# Patient Record
Sex: Female | Born: 1945 | Race: White | Hispanic: No | Marital: Married | Smoking: Never smoker
Health system: Southern US, Community
[De-identification: ages and names within clinical notes are randomized; demographics above are authoritative.]

## PROBLEM LIST (undated history)

## (undated) DIAGNOSIS — M1990 Unspecified osteoarthritis, unspecified site: Secondary | ICD-10-CM

## (undated) DIAGNOSIS — K5900 Constipation, unspecified: Secondary | ICD-10-CM

## (undated) DIAGNOSIS — H269 Unspecified cataract: Secondary | ICD-10-CM

## (undated) HISTORY — DX: Constipation, unspecified: K59.00

## (undated) HISTORY — PX: KNEE ARTHROPLASTY: SHX992

## (undated) HISTORY — PX: POLYPECTOMY: SHX149

## (undated) HISTORY — DX: Unspecified osteoarthritis, unspecified site: M19.90

## (undated) HISTORY — PX: COLONOSCOPY: SHX174

## (undated) HISTORY — PX: KNEE SURGERY: SHX244

---

## 1997-06-08 ENCOUNTER — Ambulatory Visit: Admission: RE | Admit: 1997-06-08 | Discharge: 1997-06-08 | Payer: Self-pay | Admitting: Internal Medicine

## 1997-07-10 ENCOUNTER — Ambulatory Visit (HOSPITAL_COMMUNITY): Admission: RE | Admit: 1997-07-10 | Discharge: 1997-07-10 | Payer: Self-pay | Admitting: Gastroenterology

## 1998-04-29 ENCOUNTER — Other Ambulatory Visit: Admission: RE | Admit: 1998-04-29 | Discharge: 1998-04-29 | Payer: Self-pay | Admitting: Obstetrics & Gynecology

## 1998-04-30 ENCOUNTER — Other Ambulatory Visit: Admission: RE | Admit: 1998-04-30 | Discharge: 1998-04-30 | Payer: Self-pay | Admitting: Obstetrics & Gynecology

## 2003-11-13 ENCOUNTER — Encounter: Admission: RE | Admit: 2003-11-13 | Discharge: 2003-11-13 | Payer: Self-pay | Admitting: Orthopedic Surgery

## 2004-02-02 ENCOUNTER — Other Ambulatory Visit: Admission: RE | Admit: 2004-02-02 | Discharge: 2004-02-02 | Payer: Self-pay | Admitting: Internal Medicine

## 2004-02-16 ENCOUNTER — Ambulatory Visit: Payer: Self-pay | Admitting: Internal Medicine

## 2004-04-18 ENCOUNTER — Ambulatory Visit: Payer: Self-pay | Admitting: Internal Medicine

## 2005-08-11 ENCOUNTER — Ambulatory Visit: Payer: Self-pay | Admitting: Family Medicine

## 2006-02-16 HISTORY — PX: HERNIA REPAIR: SHX51

## 2006-02-20 ENCOUNTER — Ambulatory Visit: Payer: Self-pay | Admitting: Family Medicine

## 2006-02-22 ENCOUNTER — Ambulatory Visit: Payer: Self-pay | Admitting: Family Medicine

## 2006-02-22 LAB — CONVERTED CEMR LAB
BUN: 15 mg/dL (ref 6–23)
CO2: 29 meq/L (ref 19–32)
Calcium: 9.4 mg/dL (ref 8.4–10.5)
Chloride: 106 meq/L (ref 96–112)
Cholesterol: 167 mg/dL (ref 0–200)
Creatinine, Ser: 0.9 mg/dL (ref 0.4–1.2)
GFR calc Af Amer: 82 mL/min
GFR calc non Af Amer: 68 mL/min
Glucose, Bld: 96 mg/dL (ref 70–99)
HDL: 39.8 mg/dL (ref >39.0)
LDL Cholesterol: 113 mg/dL — ABNORMAL HIGH (ref 0–99)
Potassium: 4.9 meq/L (ref 3.5–5.1)
Sodium: 139 meq/L (ref 135–145)
Total CHOL/HDL Ratio: 4.2
Triglycerides: 69 mg/dL (ref 0–149)
VLDL: 14 mg/dL (ref 0–40)

## 2006-03-21 ENCOUNTER — Encounter (INDEPENDENT_AMBULATORY_CARE_PROVIDER_SITE_OTHER): Payer: Self-pay | Admitting: Specialist

## 2006-03-21 ENCOUNTER — Ambulatory Visit (HOSPITAL_COMMUNITY): Admission: RE | Admit: 2006-03-21 | Discharge: 2006-03-21 | Payer: Self-pay | Admitting: General Surgery

## 2006-05-31 DIAGNOSIS — F329 Major depressive disorder, single episode, unspecified: Secondary | ICD-10-CM | POA: Insufficient documentation

## 2006-05-31 DIAGNOSIS — M899 Disorder of bone, unspecified: Secondary | ICD-10-CM | POA: Insufficient documentation

## 2006-05-31 DIAGNOSIS — G4700 Insomnia, unspecified: Secondary | ICD-10-CM | POA: Insufficient documentation

## 2006-05-31 DIAGNOSIS — M949 Disorder of cartilage, unspecified: Secondary | ICD-10-CM

## 2006-07-18 ENCOUNTER — Ambulatory Visit: Payer: Self-pay | Admitting: Internal Medicine

## 2006-08-02 ENCOUNTER — Ambulatory Visit: Payer: Self-pay | Admitting: Internal Medicine

## 2006-08-02 ENCOUNTER — Encounter (INDEPENDENT_AMBULATORY_CARE_PROVIDER_SITE_OTHER): Payer: Self-pay | Admitting: Family Medicine

## 2006-08-02 ENCOUNTER — Encounter: Payer: Self-pay | Admitting: Internal Medicine

## 2006-11-15 ENCOUNTER — Encounter (INDEPENDENT_AMBULATORY_CARE_PROVIDER_SITE_OTHER): Payer: Self-pay | Admitting: Family Medicine

## 2007-02-07 ENCOUNTER — Telehealth (INDEPENDENT_AMBULATORY_CARE_PROVIDER_SITE_OTHER): Payer: Self-pay | Admitting: *Deleted

## 2007-05-15 ENCOUNTER — Encounter (INDEPENDENT_AMBULATORY_CARE_PROVIDER_SITE_OTHER): Payer: Self-pay | Admitting: Family Medicine

## 2007-05-17 ENCOUNTER — Telehealth (INDEPENDENT_AMBULATORY_CARE_PROVIDER_SITE_OTHER): Payer: Self-pay | Admitting: *Deleted

## 2007-07-22 ENCOUNTER — Encounter: Payer: Self-pay | Admitting: Internal Medicine

## 2007-07-22 ENCOUNTER — Ambulatory Visit: Payer: Self-pay | Admitting: Family Medicine

## 2007-07-22 ENCOUNTER — Encounter (INDEPENDENT_AMBULATORY_CARE_PROVIDER_SITE_OTHER): Payer: Self-pay | Admitting: *Deleted

## 2007-07-29 ENCOUNTER — Ambulatory Visit: Payer: Self-pay | Admitting: Pulmonary Disease

## 2007-07-29 ENCOUNTER — Telehealth (INDEPENDENT_AMBULATORY_CARE_PROVIDER_SITE_OTHER): Payer: Self-pay | Admitting: *Deleted

## 2007-07-30 ENCOUNTER — Telehealth (INDEPENDENT_AMBULATORY_CARE_PROVIDER_SITE_OTHER): Payer: Self-pay | Admitting: *Deleted

## 2007-07-31 ENCOUNTER — Ambulatory Visit: Payer: Self-pay | Admitting: Family Medicine

## 2007-08-01 ENCOUNTER — Encounter (INDEPENDENT_AMBULATORY_CARE_PROVIDER_SITE_OTHER): Payer: Self-pay | Admitting: *Deleted

## 2007-08-01 LAB — CONVERTED CEMR LAB: Vit D, 1,25-Dihydroxy: 46 (ref 30–89)

## 2007-08-11 LAB — CONVERTED CEMR LAB
ALT: 22 units/L (ref 0–35)
AST: 32 units/L (ref 0–37)
Albumin: 4.2 g/dL (ref 3.5–5.2)
Alkaline Phosphatase: 74 units/L (ref 39–117)
BUN: 16 mg/dL (ref 6–23)
Bilirubin, Direct: 0.1 mg/dL (ref 0.0–0.3)
CO2: 32 meq/L (ref 19–32)
Calcium: 10 mg/dL (ref 8.4–10.5)
Chloride: 104 meq/L (ref 96–112)
Cholesterol: 195 mg/dL (ref 0–200)
Creatinine, Ser: 0.8 mg/dL (ref 0.4–1.2)
GFR calc Af Amer: 93 mL/min
GFR calc non Af Amer: 77 mL/min
Glucose, Bld: 100 mg/dL — ABNORMAL HIGH (ref 70–99)
HDL: 40.8 mg/dL (ref >39.0)
LDL Cholesterol: 138 mg/dL — ABNORMAL HIGH (ref 0–99)
Potassium: 5.7 meq/L — ABNORMAL HIGH (ref 3.5–5.1)
Sodium: 141 meq/L (ref 135–145)
TSH: 1.94 microintl units/mL (ref 0.35–5.50)
Total Bilirubin: 0.8 mg/dL (ref 0.3–1.2)
Total CHOL/HDL Ratio: 4.8
Total Protein: 7.9 g/dL (ref 6.0–8.3)
Triglycerides: 82 mg/dL (ref 0–149)
VLDL: 16 mg/dL (ref 0–40)

## 2007-08-12 ENCOUNTER — Encounter (INDEPENDENT_AMBULATORY_CARE_PROVIDER_SITE_OTHER): Payer: Self-pay | Admitting: *Deleted

## 2007-12-17 ENCOUNTER — Telehealth (INDEPENDENT_AMBULATORY_CARE_PROVIDER_SITE_OTHER): Payer: Self-pay | Admitting: *Deleted

## 2008-01-22 ENCOUNTER — Encounter: Admission: RE | Admit: 2008-01-22 | Discharge: 2008-01-22 | Payer: Self-pay | Admitting: Orthopedic Surgery

## 2008-12-15 ENCOUNTER — Encounter: Payer: Self-pay | Admitting: Family Medicine

## 2009-05-18 ENCOUNTER — Ambulatory Visit: Payer: Self-pay | Admitting: Family Medicine

## 2009-05-18 ENCOUNTER — Encounter (INDEPENDENT_AMBULATORY_CARE_PROVIDER_SITE_OTHER): Payer: Self-pay | Admitting: *Deleted

## 2009-05-18 DIAGNOSIS — M810 Age-related osteoporosis without current pathological fracture: Secondary | ICD-10-CM | POA: Insufficient documentation

## 2009-05-26 ENCOUNTER — Ambulatory Visit: Payer: Self-pay | Admitting: Family Medicine

## 2009-05-26 LAB — CONVERTED CEMR LAB
Bilirubin Urine: NEGATIVE
Blood in Urine, dipstick: NEGATIVE
Glucose, Urine, Semiquant: NEGATIVE
Ketones, urine, test strip: NEGATIVE
Nitrite: NEGATIVE
Protein, U semiquant: NEGATIVE
Specific Gravity, Urine: <1.005
Urobilinogen, UA: 0.2
WBC Urine, dipstick: NEGATIVE
pH: 7.5

## 2009-05-27 LAB — CONVERTED CEMR LAB: Vit D, 25-Hydroxy: 45 ng/mL (ref 30–89)

## 2009-05-31 LAB — CONVERTED CEMR LAB
ALT: 19 units/L (ref 0–35)
AST: 28 units/L (ref 0–37)
Albumin: 4.1 g/dL (ref 3.5–5.2)
Alkaline Phosphatase: 58 units/L (ref 39–117)
BUN: 16 mg/dL (ref 6–23)
Basophils Absolute: 0.1 10*3/uL (ref 0.0–0.1)
Basophils Relative: 1.2 % (ref 0.0–3.0)
Bilirubin, Direct: 0.1 mg/dL (ref 0.0–0.3)
CO2: 31 meq/L (ref 19–32)
Calcium: 9.8 mg/dL (ref 8.4–10.5)
Chloride: 100 meq/L (ref 96–112)
Cholesterol: 172 mg/dL (ref 0–200)
Creatinine, Ser: 0.8 mg/dL (ref 0.4–1.2)
Eosinophils Absolute: 0.8 10*3/uL — ABNORMAL HIGH (ref 0.0–0.7)
Eosinophils Relative: 13.3 % — ABNORMAL HIGH (ref 0.0–5.0)
GFR calc non Af Amer: 74.62 mL/min (ref >60)
Glucose, Bld: 81 mg/dL (ref 70–99)
HCT: 37.6 % (ref 36.0–46.0)
HDL: 43 mg/dL (ref >39.00)
Hemoglobin: 13 g/dL (ref 12.0–15.0)
LDL Cholesterol: 103 mg/dL — ABNORMAL HIGH (ref 0–99)
Lymphocytes Relative: 35.7 % (ref 12.0–46.0)
Lymphs Abs: 2.2 10*3/uL (ref 0.7–4.0)
MCHC: 34.7 g/dL (ref 30.0–36.0)
MCV: 91.3 fL (ref 78.0–100.0)
Monocytes Absolute: 0.5 10*3/uL (ref 0.1–1.0)
Monocytes Relative: 7.9 % (ref 3.0–12.0)
Neutro Abs: 2.6 10*3/uL (ref 1.4–7.7)
Neutrophils Relative %: 41.9 % — ABNORMAL LOW (ref 43.0–77.0)
Platelets: 221 10*3/uL (ref 150.0–400.0)
Potassium: 4.4 meq/L (ref 3.5–5.1)
RBC: 4.12 M/uL (ref 3.87–5.11)
RDW: 13 % (ref 11.5–14.6)
Sodium: 140 meq/L (ref 135–145)
TSH: 2.18 microintl units/mL (ref 0.35–5.50)
Total Bilirubin: 0.5 mg/dL (ref 0.3–1.2)
Total CHOL/HDL Ratio: 4
Total Protein: 7.2 g/dL (ref 6.0–8.3)
Triglycerides: 129 mg/dL (ref 0.0–149.0)
VLDL: 25.8 mg/dL (ref 0.0–40.0)
WBC: 6.3 10*3/uL (ref 4.5–10.5)

## 2009-06-01 ENCOUNTER — Ambulatory Visit: Payer: Self-pay | Admitting: Family Medicine

## 2009-06-02 LAB — CONVERTED CEMR LAB: Fecal Occult Bld: NEGATIVE

## 2010-02-13 LAB — CONVERTED CEMR LAB: Pap Smear: NORMAL

## 2010-02-15 NOTE — Progress Notes (Signed)
Summary: low[facility]--Refill  Phone Note Refill Request   Refills Requested: Medication #1:  BONIVA 150 MG TABS Take 1 tablet by mouth as directed Rx received via fax from [facility]--[phone]  Initial call taken by: Freddy Jaksch,  May 17, 2007 9:17 AM  Follow-up for Phone Call        faxed to [facility] has appt pending 07/22/07 .........................Marland KitchenDoristine Devoid  May 17, 2007 10:25 AM       Prescriptions: BONIVA 150 MG TABS (IBANDRONATE SODIUM) Take 1 tablet by mouth as directed  #3 x 0   Entered by:   Doristine Devoid   Authorized by:   Loreen Freud DO   Signed by:   Doristine Devoid on 05/17/2007   Method used:   Historical   RxID:   [id number]

## 2010-02-15 NOTE — Progress Notes (Signed)
Summary: refill  Phone Note Refill Request   Refills Requested: Medication #1:  BONIVA 150 MG TABS Take 1 tablet by mouth as directed.  Medication #2:  clonidine hcl tabs .1mg  via [facility]  Initial call taken by: Charolette Child,  July 29, 2007 9:50 AM      Prescriptions: BONIVA 150 MG TABS (IBANDRONATE SODIUM) Take 1 tablet by mouth as directed  #3 x 0   Entered by:   Kandice Hams   Authorized by:   Loreen Freud DO   Signed by:   Kandice Hams on 07/29/2007   Method used:   Printed then faxed to ...       [facility]             , [place]         Ph:        Fax: [phone]   RxID:   [id number]

## 2010-02-15 NOTE — Procedures (Signed)
Summary: Gastroenterology--COLON  Gastroenterology--COLON   Imported By: Freddy Jaksch 08/17/2006 12:27:52  _____________________________________________________________________  External Attachment:    Type:   Image     Comment:   colon

## 2010-02-15 NOTE — Progress Notes (Signed)
Summary: rx shingles - dr Blossom Hoops  Medications Added BONIVA 150 MG TABS (IBANDRONATE SODIUM) Take 1 tablet by mouth as directed BUPROPION HCL (SMOKING DETER) 150 MG TB12 (BUPROPION HCL (SMOKING DETER)) Take 1 tablet by mouth twice a day CLONIDINE HCL 0.1 MG TABS (CLONIDINE HCL) Take 1 tablet by mouth twice a day       Phone Note Call from Patient Call back at [facility] Phone [phone]   Caller: Patient Summary of Call: patient needs rx for shingles [facility] [phone] --- fax [phone]  they will get inj there Initial call taken by: Okey Regal [place],  February 07, 2007 1:12 PM  Follow-up for Phone Call        Okay to call in Rx: Make sure pharmacist reviewed indication, contra -indication with patient prior to giving it. Follow-up by: Leanne Chang MD,  February 07, 2007 3:15 PM  Additional Follow-up for Phone Call Additional follow up Details #1::        Rx called in to pharmacy and will review vaccine and patient aware ...................................................................Ardyth Man  February 07, 2007 3:55 PM  Additional Follow-up by: Ardyth Man,  February 07, 2007 3:55 PM    New/Updated Medications: BONIVA 150 MG TABS (IBANDRONATE SODIUM) Take 1 tablet by mouth as directed BUPROPION HCL (SMOKING DETER) 150 MG TB12 (BUPROPION HCL (SMOKING DETER)) Take 1 tablet by mouth twice a day CLONIDINE HCL 0.1 MG TABS (CLONIDINE HCL) Take 1 tablet by mouth twice a day

## 2010-02-15 NOTE — Letter (Signed)
Summary: [facility]: Immunoassay Fecal Occult Blood (iFOB) Order Form  [facility] [place]  [address] [place], [place] [address]   Phone: [phone]  Fax: [phone]      [facility]: Immunoassay Fecal Occult Blood (iFOB) Order Form   May 18, 2009 MRN: [id number]   Charlotte Brooks 11/25/1945   Physicican Name:_____Yvonne Laury Axon, DO____________________  Diagnosis Code:______v76.51____________________      Charlotte Brooks CMA

## 2010-02-15 NOTE — Progress Notes (Signed)
Summary: [facility]--REFILL  Phone Note Refill Request   Refills Requested: Medication #1:  BONIVA 150 MG TABS Take 1 tablet by mouth as directed [facility]--[phone]  Initial call taken by: Freddy Jaksch,  December 17, 2007 10:59 AM    New/Updated Medications: BONIVA 150 MG TABS (IBANDRONATE SODIUM) Take 1 tablet by mouth monthly   Prescriptions: BONIVA 150 MG TABS (IBANDRONATE SODIUM) Take 1 tablet by mouth monthly  #33 x 0   Entered by:   Kandice Hams   Authorized by:   Loreen Freud DO   Signed by:   Kandice Hams on 12/17/2007   Method used:   Faxed to ...       [facility] (mail-order)             ,          Ph: [phone]       Fax: [phone]   RxID:   [id number]

## 2010-02-15 NOTE — Assessment & Plan Note (Signed)
Summary: consult for insomnia   Referred by:  Loreen Freud PCP:  Laury Axon  Chief Complaint:  Sleep Consult.  History of Present Illness: the patient is a 65 year old female who I've been asked to see for chronic insomnia. The patient has had difficulty with her sleep for greater than 20 years, and it continues to be a significant issue for her. The patient typically goes to bed between 10 and 11 at night, and he usually takes her at least one to 2 hours to get to sleep. She has tried a year ago relaxation prior to going to bed, and will sometimes read in bed or watch TV in order to relax. The patient may awaken 2-3 times a night, and when she does it is difficult for her to get back to sleep. She has tried taking Tylenol PM which helps a little bit, but continues to have significant issues. She will typically arises at 6 AM to start her day on a consistent basis. The patient admits that she has a sense of dread about going to sleep, and it is a major source of frustration for her. It has led to daytime fatigue, but only some sleep pressure during periods of inactivity. The patient has had no definite snoring, and had a sleep study a long time ago with insufficient sleep. She does have fidgety legs whenever she goes to bed on occasion, but her husband has never mentioned kicking during the night while sleeping. In terms of sleep hygiene, the patient never takes naps, and only drinks 2 cups of caffeinated beverage in the morning, with none being after 9 AM. She does not drink caffeinated tea or sodas. She never goes back in the bedroom during the day.she does not exercise within 4 hours of bedtime. The patient has tried Ambien, possibly Restoril, and now Tylenol PM.     Current Allergies: ! PCN ! SULFA ! CODEINE ! * IVP DYE  Past Medical History:    PREVENTIVE HEALTH CARE (ICD-V70.0)    INSOMNIA, CHRONIC (ICD-307.42)    OSTEOPENIA (ICD-733.90)    DEPRESSION (ICD-311)       Past Surgical History:     Reviewed history from 07/22/2007 and no changes required:       right sided hernia --2/08       knee surgery   Family History:    Family History Diabetes 1st degree relative    Family History of CAD Female 1st degree relative <50            heart disease: brother  Social History:    Married    Never Smoked    Alcohol use-yes    Drug use-no    Regular exercise-yes    pt does have children.    Risk Factors: Tobacco use:  never Drug use:  no HIV high-risk behavior:  no Caffeine use:  1 drinks per day Alcohol use:  yes    Type:  wine occass    Drinks per day:  <1 Exercise:  yes    Times per week:  7    Type:  [employer] classes and water and walking Seatbelt use:  100 % Sun Exposure:  rarely  Family History Risk Factors:    Family History of MI in females < 95 years old:  no    Family History of MI in males < 57 years old:  no  Colonoscopy History:    Date of Last Colonoscopy:  08/02/2006  Mammogram History:    Date of  Last Mammogram:  11/02/2006   Review of Systems      See HPI   Vital Signs:  Patient Profile:   65 Years Old Female Height:     65.5 inches Weight:      149.13 pounds O2 Sat:      98 % O2 treatment:    Room Air Temp:     98.0 degrees F oral Pulse rate:   72 / minute BP sitting:   120 / 70  (right arm) Cuff size:   regular  Vitals Entered By: Cyndia Diver LPN (July 29, 2007 11:57 AM)             Is Patient Diabetic? No Comments Medications reviewed with patient Cyndia Diver LPN  July 29, 2007 11:57 AM      Physical Exam  General:     well-developed female in no acute distress  Eyes:     PERRLA and EOMI.   Nose:     patent without discharge Mouth:     clear Neck:     no JVD, thyromegaly, or lymphadenopathy. Lungs:     totally clear to auscultation Heart:     regular rate and rhythm, no MRG Abdomen:     soft and nontender, bowel sounds present Extremities:     no significant edema, pulses intact distally Neurologic:      alert and oriented, moves all 4 extremities.      Impression & Recommendations:  Problem # 1:  PERSISTENT DISORDER INITIATING/MAINTAINING SLEEP (ICD-307.42) the patient describes classic psychophysiologic insomnia. She definitely has a sense of frustration about going to sleep. I also think she is a highly functional individual who has a difficult time settling down for sleep. I discussed with her various techniques for treatment of insomnia, and these include ritualistic behaviors as well as stimulus control therapy. The patient is willing to try these to see if they will help. She is already doing many of the main components of them. Her sleep hygiene is fairly good, but I have told her that she cannot read or watch television in bed. I would also like to give her a 2-3 week course of a sleep aid to help break the cycle of her insomnia. I think she may benefit the most from trazodone and rozeram in combination.  Medications Added to Medication List This Visit: 1)  Rozerem 8 Mg Tabs (Ramelteon) .... One tablet by mouth at bedtime 2)  Trazodone Hcl 50 Mg Tabs (Trazodone hcl) .... At bedtime   Patient Instructions: 1)  take rozerem one at bedtime 2)  take trazadone one before bedtime.  If oversedated the next day, can cut in half 3)  work on behavioral changes we discussed. 4)  f/u 3 weeks   Prescriptions: TRAZODONE HCL 50 MG  TABS (TRAZODONE HCL) At bedtime  #30 x 0   Entered and Authorized by:   Barbaraann Share MD   Signed by:   Barbaraann Share MD on 07/29/2007   Method used:   Print then Give to Patient   RxID:   [id number] ROZEREM 8 MG  TABS (RAMELTEON) One tablet by mouth at bedtime  #30 x 0   Entered and Authorized by:   Barbaraann Share MD   Signed by:   Barbaraann Share MD on 07/29/2007   Method used:   Print then Give to Patient   RxID:   [id number]  ]

## 2010-02-15 NOTE — Letter (Signed)
[facility]: [facility]   Imported By: Freddy Jaksch 05/15/2007 12:05:19  _____________________________________________________________________  External Attachment:    Type:   Image     Comment:   External Document

## 2010-02-15 NOTE — Letter (Signed)
Summary: Results Follow-up Letter  [place] [facility]  [address] [place], [place] [address]   Phone: [phone]  Fax: [phone]    08/12/2007        Charlotte Brooks [address] [place], [place]  [address]  Dear Ms. Mclarty,   The following are the results of your recent test(s):  Test     Result     Pap Smear    Normal_______  Not Normal_____       Comments: _________________________________________________________ Cholesterol LDL(Bad cholesterol):          Your goal is less than:         HDL (Good cholesterol):        Your goal is more than: _________________________________________________________ Other Tests:   _________________________________________________________  Please call for an appointment Or __Please see attached lab report._______________________________________________________ _________________________________________________________ _________________________________________________________  Sincerely,  Ardyth Man [place] [facility]

## 2010-02-15 NOTE — Progress Notes (Signed)
Summary: clonidine rx PT WANTS TO START BACK/DR [facility] ADVISE  Phone Note Refill Request Call back at [facility]   clonidine hcl tabs 0.1mg  qty 180  refill 3  Dr Laury Axon I did not see this med on pt med list, pt just had cpx 07/22/07.  I called pt  about this med; she said she was on this a while back given by Dr Blossom Hoops not for her bp but for her night sweats, she wanted to start back, she forgot to mention at cpx 07/22/07  See 02/07/07 ov note med was mentioned on it.  Please advise   Method Requested: clonidine hcl 0.1mg  Initial call taken by: Kandice Hams,  July 30, 2007 9:20 AM  Follow-up for Phone Call        clonidine 0.1 mg two times a day #60 2 refills Follow-up by: Loreen Freud DO,  July 30, 2007 12:01 PM  Additional Follow-up for Phone Call Additional follow up Details #1::        Spoke with pt informed rx faxed to [facility]  August 01, 2007 9:47 AM  Additional Follow-up by: Kandice Hams,  August 01, 2007 9:47 AM    New/Updated Medications: CLONIDINE HCL 0.1 MG  TABS (CLONIDINE HCL) 1 by mouth two times a day   Prescriptions: CLONIDINE HCL 0.1 MG  TABS (CLONIDINE HCL) 1 by mouth two times a day  #180 x 0   Entered by:   Kandice Hams   Authorized by:   Loreen Freud DO   Signed by:   Kandice Hams on 08/01/2007   Method used:   Printed then faxed to ...       [facility]             , [place]         Ph:        Fax: [phone]   RxID:   [id number]

## 2010-02-15 NOTE — Assessment & Plan Note (Signed)
Summary: cpx/kdc   Vital Signs:  Patient profile:   65 year old female Height:      65.5 inches Weight:      146 pounds BMI:     24.01 Pulse rate:   76 / minute Pulse rhythm:   regular BP sitting:   126 / 80  (left arm) Cuff size:   regular  Vitals Entered By: Army Fossa CMA (May 18, 2009 12:58 PM) CC: CPX, no pap.   History of Present Illness: Pt here for cpe and we will check labs another day.   No complaints.    Preventive Screening-Counseling & Management  Alcohol-Tobacco     Alcohol drinks/day: <1     Alcohol type: wine occass     Smoking Status: never  Caffeine-Diet-Exercise     Caffeine use/day: 1     Does Patient Exercise: yes     Type of exercise: [employer] classes and water and walking     Exercise (avg: min/session): 30-60     Times/week: 7  Hep-HIV-STD-Contraception     HIV Risk: no     Dental Visit-last 6 months yes     Dental Care Counseling: not indicated; dental care within six months     SBE monthly: yes     SBE Education/Counseling: not indicated; SBE done regularly     Sun Exposure-Excessive: rarely  Safety-Violence-Falls     Seat Belt Use: 100      Sexual History:  currently monogamous.        Drug Use:  never.    Current Medications (verified): 1)  Reclast 5 Mg/139ml Soln (Zoledronic Acid) 2)  Vitamin D3 1000 Unit Tabs (Cholecalciferol) .Marland Kitchen.. 1 By Mouth Once Daily 3)  Multivitamins  Tabs (Multiple Vitamin) .Marland Kitchen.. 1 By Mouth Once Daily 4)  Zinc Magnesium Aspartate 150-3.83-10 Mg Caps (Zinc-Magnesium Aspart-Vit B6) .Marland Kitchen.. 1 By Mouth Once Daily 5)  B Complex-B12  Tabs (B Complex Vitamins) .Marland Kitchen.. 1 By Mouth Once Daily 6)  Glucosamine 500 Mg Caps (Glucosamine Sulfate) 7)  Aspirin 81 Mg Tbec (Aspirin) .Marland Kitchen.. 1 By Mouth Once Daily 8)  Vitamin C Cr 500 Mg Cr-Caps (Ascorbic Acid) .Marland Kitchen.. 1 By Mouth Once Daily 9)  Mobic 7.5 Mg Tabs (Meloxicam) .Marland Kitchen.. 1 By Mouth Once Daily 10)  Caltrate 600+d Plus 600-400 Mg-Unit Tabs (Calcium Carbonate-Vit D-Min) .Marland Kitchen.. 1 By  Mouth Two Times A Day  Allergies: 1)  ! Pcn 2)  ! Sulfa 3)  ! Codeine 4)  ! * Ivp Dye  Past History:  Past Medical History: Last updated: 07/29/2007 PREVENTIVE HEALTH CARE (ICD-V70.0) INSOMNIA, CHRONIC (ICD-307.42) OSTEOPENIA (ICD-733.90) DEPRESSION (ICD-311)    Past Surgical History: Last updated: 07/22/2007 right sided hernia --2/08 knee surgery  Family History: Last updated: 07/29/2007 Family History Diabetes 1st degree relative Family History of CAD Female 1st degree relative <50   heart disease: brother  Social History: Last updated: 07/29/2007 Married Never Smoked Alcohol use-yes Drug use-no Regular exercise-yes pt does have children.   Risk Factors: Alcohol Use: <1 (05/18/2009) Caffeine Use: 1 (05/18/2009) Exercise: yes (05/18/2009)  Risk Factors: Smoking Status: never (05/18/2009)  Family History: Reviewed history from 07/29/2007 and no changes required. Family History Diabetes 1st degree relative Family History of CAD Female 1st degree relative <50   heart disease: brother  Social History: Reviewed history from 07/29/2007 and no changes required. Married Never Smoked Alcohol use-yes Drug use-no Regular exercise-yes pt does have children. Dental Care w/in 6 mos.:  yes Sexual History:  currently monogamous Drug Use:  never  Review of Systems      See HPI General:  Denies chills, fatigue, fever, loss of appetite, malaise, sleep disorder, sweats, weakness, and weight loss. Eyes:  Denies blurring, discharge, double vision, eye irritation, eye pain, halos, itching, light sensitivity, red eye, vision loss-1 eye, and vision loss-both eyes; optho-- q32m glaucoma specialist--q53m . ENT:  Denies decreased hearing, difficulty swallowing, ear discharge, earache, hoarseness, nasal congestion, nosebleeds, postnasal drainage, ringing in ears, sinus pressure, and sore throat. CV:  Denies bluish discoloration of lips or nails, chest pain or discomfort,  difficulty breathing at night, difficulty breathing while lying down, fainting, fatigue, leg cramps with exertion, lightheadness, near fainting, palpitations, shortness of breath with exertion, swelling of feet, swelling of hands, and weight gain. Resp:  Denies chest discomfort, chest pain with inspiration, cough, coughing up blood, excessive snoring, hypersomnolence, morning headaches, pleuritic, shortness of breath, sputum productive, and wheezing. GI:  Denies abdominal pain, bloody stools, change in bowel habits, constipation, dark tarry stools, diarrhea, excessive appetite, gas, hemorrhoids, indigestion, loss of appetite, nausea, vomiting, vomiting blood, and yellowish skin color. GU:  Denies abnormal vaginal bleeding, decreased libido, discharge, dysuria, genital sores, hematuria, incontinence, nocturia, urinary frequency, and urinary hesitancy. MS:  Complains of joint pain; ortho--alussio. Derm:  Denies changes in color of skin, changes in nail beds, dryness, excessive perspiration, flushing, hair loss, insect bite(s), itching, lesion(s), poor wound healing, and rash. Neuro:  Denies brief paralysis, difficulty with concentration, disturbances in coordination, falling down, headaches, inability to speak, memory loss, numbness, poor balance, seizures, sensation of room spinning, tingling, tremors, visual disturbances, and weakness. Psych:  Denies alternate hallucination ( auditory/visual), anxiety, depression, easily angered, easily tearful, irritability, mental problems, panic attacks, sense of great danger, suicidal thoughts/plans, thoughts of violence, unusual visions or sounds, and thoughts /plans of harming others. Endo:  Denies cold intolerance, excessive hunger, excessive thirst, excessive urination, heat intolerance, polyuria, and weight change. Heme:  Denies abnormal bruising, bleeding, enlarge lymph nodes, fevers, pallor, and skin discoloration. Allergy:  Denies hives or rash, itching eyes,  persistent infections, seasonal allergies, and sneezing.  Physical Exam  General:  Well-developed,well-nourished,in no acute distress; alert,appropriate and cooperative throughout examination Head:  Normocephalic and atraumatic without obvious abnormalities. No apparent alopecia or balding. Eyes:  pupils equal, pupils round, pupils reactive to light, and no injection.   Ears:  External ear exam shows no significant lesions or deformities.  Otoscopic examination reveals clear canals, tympanic membranes are intact bilaterally without bulging, retraction, inflammation or discharge. Hearing is grossly normal bilaterally. Nose:  External nasal examination shows no deformity or inflammation. Nasal mucosa are pink and moist without lesions or exudates. Mouth:  Oral mucosa and oropharynx without lesions or exudates.  Teeth in good repair. Neck:  No deformities, masses, or tenderness noted. Chest Wall:  No deformities, masses, or tenderness noted. Breasts:  gyn Lungs:  Normal respiratory effort, chest expands symmetrically. Lungs are clear to auscultation, no crackles or wheezes. Heart:  normal rate and no murmur.   Abdomen:  Bowel sounds positive,abdomen soft and non-tender without masses, organomegaly or hernias noted. Rectal:  gyn Genitalia:  gyn Msk:  normal ROM, no joint tenderness, no joint swelling, no joint warmth, no redness over joints, no joint deformities, no joint instability, and no crepitation.   Pulses:  R posterior tibial normal, R dorsalis pedis normal, R carotid normal, L posterior tibial normal, L dorsalis pedis normal, and L carotid normal.   Extremities:  No clubbing, cyanosis, edema, or deformity noted with normal  full range of motion of all joints.   Neurologic:  No cranial nerve deficits noted. Station and gait are normal. Plantar reflexes are down-going bilaterally. DTRs are symmetrical throughout. Sensory, motor and coordinative functions appear intact. Skin:  Intact without  suspicious lesions or rashes Cervical Nodes:  No lymphadenopathy noted Axillary Nodes:  No palpable lymphadenopathy Psych:  Cognition and judgment appear intact. Alert and cooperative with normal attention span and concentration. 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 Take 1 tablet by mouth monthly Her updated medication list for this problem includes:    Reclast 5 Mg/173ml Soln (Zoledronic acid)    Vitamin D3 1000 Unit Tabs (Cholecalciferol) .Marland Kitchen... 1 by mouth once daily    Caltrate 600+d Plus 600-400 Mg-unit Tabs (Calcium carbonate-vit d-min) .Marland Kitchen... 1 by mouth two times a day  Bone Density: [facility]  (12/30/2008) Vit D:46 (07/31/2007)  Orders: EKG w/ Interpretation (93[address])  Complete Medication List: 1)  Reclast 5 Mg/164ml Soln (Zoledronic acid) 2)  Vitamin D3 1000 Unit Tabs (Cholecalciferol) .Marland Kitchen.. 1 by mouth once daily 3)  Multivitamins Tabs (Multiple vitamin) .Marland Kitchen.. 1 by mouth once daily 4)  Zinc Magnesium Aspartate 150-3.83-10 Mg Caps (Zinc-magnesium aspart-vit b6) .Marland Kitchen.. 1 by mouth once daily 5)  B Complex-b12 Tabs (B complex vitamins) .Marland Kitchen.. 1 by mouth once daily 6)  Glucosamine 500 Mg Caps (Glucosamine sulfate) 7)  Aspirin 81 Mg Tbec (Aspirin) .Marland Kitchen.. 1 by mouth once daily 8)  Vitamin C Cr 500 Mg Cr-caps (Ascorbic acid) .Marland Kitchen.. 1 by mouth once daily 9)  Mobic 7.5 Mg Tabs (Meloxicam) .Marland Kitchen.. 1 by mouth once daily 10)  Caltrate 600+d Plus 600-400 Mg-unit Tabs (Calcium carbonate-vit d-min) .Marland Kitchen.. 1 by mouth two times a day  Patient Instructions: 1)  rto fasting labs   V70.0  733.0   cbcd, bmp, hep, lipid, vita D, tsh,  UA,     EKG  Procedure date:  05/18/2009  Findings:      Normal sinus rhythm with rate of:  67 bpm   Bone Density  Procedure date:  12/30/2008  Findings:      [facility]   Comments:      Assessment:  Osteoporosis.        Flu Vaccine Next Due:  Not Indicated PAP Result Date:  12/30/2008 PAP Result:  normal PAP Next Due:  1 yr Last Mammogram:  Normal Bilateral (11/02/2006 1:33:45 PM) Mammogram Result Date:  12/30/2008 Mammogram Result:  normal Mammogram Next Due:  1 yr

## 2010-06-03 NOTE — Assessment & Plan Note (Signed)
[facility]                        [facility] OFFICE NOTE   NAME:[facility], Charlotte Brooks                    MRN:          [id number]  DATE:02/20/2006                            DOB:          October 14, 1945    REASON FOR VISIT:  Possible hernia.   Ms. [facility] is a 65 year old female who reports that one week ago she  was lifting up the recycling bin, when she suddenly felt a pop in the  right lower quadrant. The patient noticed a bulging the size of an egg  off and on in that area.  She states that she can reduce it by pushing  on it.  It is slightly uncomfortable.  It is definitely precipitated  with abdominal exercise.   Regarding her other medical problems, she is stable.  Her depression is  stable on Bupropion at 550 mg.  Additionally, she has chronic insomnia  which she treats intermittently with over-the-counter Tylenol p.m.  She  also has a history of osteopenia, for which she takes Boniva monthly.   MEDICATIONS:  1. Clonidine 0.1 mg b.i.d.  2. Bupropion XR 150 mg b.i.d.  3. Multivitamins.  4. Aspirin 81 mg.  5. Omega 3 fatty acids.  6. Boniva.   ALLERGIES:  PENICILLIN, SULFA AND CODEINE.   OBJECTIVE:  VITAL SIGNS:  Weight 138, temperature 97.3, pulse 72, blood  pressure 124/70.  GENERAL:  Pleasant female in no acute distress, answers questions  appropriately.  HEENT:  Unremarkable.  NECK:  Supple, no lymphadenopathy, carotid bruits or JVD.  LUNGS:  Clear.  HEART:  Regular rate and rhythm, normal S1, S2.  No murmurs, gallops or  rubs.  ABDOMEN:  Soft, nondistended.  There was a slight bulge noted in the  right inguinal area.  Valsalva would amplify the bulging, easily  reducible with palpation.   IMPRESSION:  1. Depression, stable.  2. Osteopenia.  3. Chronic insomnia.  4. Right inguinal hernia.   PLAN:  1. Reviewed health maintenance issues, and patient was up to date,      except for a colonoscopy which she will schedule  with Dr. Juanda Chance.  2. Will obtain routine lab work including a basic metabolic profile      and lipid profile in the next week.  3. Will refer patient to Dr. Talmage Nap for evaluation and treatment of a      right inguinal hernia.      Precautions were reviewed with patient, advised not to do any heavy      lifting or pulling, until she was evaluated by Dr. Talmage Nap.  Patient      expressed understanding.  She is to follow  up in 6 months or      sooner if need be.     Leanne Chang, M.D.  Electronically Signed    LA/MedQ  DD: 02/20/2006  DT: 02/20/2006  Job #: [id number]

## 2010-06-03 NOTE — Op Note (Signed)
NAME:  Charlotte [facility], Charlotte Brooks             ACCOUNT NO.:  [id number]   MEDICAL RECORD NO.:  [id number]          PATIENT TYPE:  AMB   LOCATION:  SDS                          FACILITY:  [facility]   PHYSICIAN:  Leonie Man, M.D.   DATE OF BIRTH:  [birth year]   DATE OF PROCEDURE:  03/21/2006  DATE OF DISCHARGE:                               OPERATIVE REPORT   PREOPERATIVE DIAGNOSIS:  Right inguinal hernia.   POSTOPERATIVE DIAGNOSIS:  Right indirect inguinal hernia.   SURGEON:  Leonie Man, M.D.   ASSISTANT:  OR tech.   ANESTHESIA:  General.   INDICATIONS FOR PROCEDURE:  Charlotte [facility] is a 65 year old  female, who presents with symptoms of a right lower quadrant bulge after  having lifted something heavy and having felt a pop in her right side.  She on examination is noted to have a right inguinal hernia, and comes  to the operating room now for repair.  The patient understands the risks  and potential benefits of surgery and gives her consent to same.   PROCEDURE:  Following the induction of satisfactory general anesthesia,  the patient is identified as [name] and the right side is  identified as the area for hernia repair.  The abdomen is prepped and  draped to be included in the sterile operative field.  A transverse  incision in the lower groin crease on the right side is carried down  through the skin and subcutaneous tissue, deepening the incision down to  the external oblique aponeurosis.  The external oblique aponeurosis is  opened up through the external inguinal ring with protection of the  ilioinguinal nerve.  The round ligament is mobilized and divided at the  pubic tubercle and carried up to the internal ring.  At the internal  ring, a fairly large indirect hernia sac is dissected free.  This is  opened.  There are no intra-abdominal contents within it, and the sac is  then suture ligated at its base and the redundant sac is amputated and  forwarded for  pathologic evaluation.  The stump of the hernia sac is  retracted up into the internal ring.  The internal ring is then closed  with a running suture of 2 Novofil to completely enclose the internal  ring.  There was no direct inguinal hernia noted.  Sponges, instruments  and sharp counts verified.  All areas of dissection were checked for  hemostasis and noted to be dry.  The external oblique aponeurosis was  closed with a running 2-0 Vicryl suture.  Scarpa fascia was closed with  a running 3-0 Vicryl suture, and the skin was closed with a running 4-0  Monocryl suture, and it was then reinforced with Steri-Strips.  A  sterile dressing was applied, the anesthetic reversed and the patient  removed from the operating room to the recovery room in stable  condition.  She tolerated the procedure well.     Leonie Man, M.D.  Electronically Signed    PB/MEDQ  D:  03/21/2006  T:  03/21/2006  Job:  [id number]   cc:   Leonie Man, MD

## 2010-12-24 LAB — HM DEXA SCAN

## 2011-05-24 ENCOUNTER — Ambulatory Visit (INDEPENDENT_AMBULATORY_CARE_PROVIDER_SITE_OTHER): Payer: Medicare Other | Admitting: Family Medicine

## 2011-05-24 ENCOUNTER — Encounter: Payer: Self-pay | Admitting: Family Medicine

## 2011-05-24 VITALS — BP 124/72 | HR 70 | Temp 97.9°F | Wt 143.8 lb

## 2011-05-24 DIAGNOSIS — Z0000 Encounter for general adult medical examination without abnormal findings: Secondary | ICD-10-CM

## 2011-05-24 DIAGNOSIS — G4700 Insomnia, unspecified: Secondary | ICD-10-CM

## 2011-05-24 DIAGNOSIS — Z8249 Family history of ischemic heart disease and other diseases of the circulatory system: Secondary | ICD-10-CM

## 2011-05-24 DIAGNOSIS — R5383 Other fatigue: Secondary | ICD-10-CM

## 2011-05-24 DIAGNOSIS — Z136 Encounter for screening for cardiovascular disorders: Secondary | ICD-10-CM

## 2011-05-24 DIAGNOSIS — M810 Age-related osteoporosis without current pathological fracture: Secondary | ICD-10-CM

## 2011-05-24 DIAGNOSIS — R5381 Other malaise: Secondary | ICD-10-CM

## 2011-05-24 LAB — LIPID PANEL
Cholesterol: 180 mg/dL (ref 0–200)
HDL: 42.4 mg/dL (ref >39.00)
LDL Cholesterol: 118 mg/dL — ABNORMAL HIGH (ref 0–99)
Total CHOL/HDL Ratio: 4
Triglycerides: 100 mg/dL (ref 0.0–149.0)
VLDL: 20 mg/dL (ref 0.0–40.0)

## 2011-05-24 LAB — HEPATIC FUNCTION PANEL
ALT: 20 U/L (ref 0–35)
AST: 27 U/L (ref 0–37)
Albumin: 4.3 g/dL (ref 3.5–5.2)
Alkaline Phosphatase: 69 U/L (ref 39–117)
Bilirubin, Direct: 0 mg/dL (ref 0.0–0.3)
Total Bilirubin: 0.8 mg/dL (ref 0.3–1.2)
Total Protein: 8.2 g/dL (ref 6.0–8.3)

## 2011-05-24 LAB — BASIC METABOLIC PANEL
BUN: 17 mg/dL (ref 6–23)
CO2: 27 mEq/L (ref 19–32)
Calcium: 9.7 mg/dL (ref 8.4–10.5)
Chloride: 104 mEq/L (ref 96–112)
Creatinine, Ser: 0.9 mg/dL (ref 0.4–1.2)
GFR: 68.35 mL/min (ref >60.00)
Glucose, Bld: 89 mg/dL (ref 70–99)
Potassium: 4.9 mEq/L (ref 3.5–5.1)
Sodium: 141 mEq/L (ref 135–145)

## 2011-05-24 LAB — CBC WITH DIFFERENTIAL/PLATELET
Basophils Absolute: 0.1 10*3/uL (ref 0.0–0.1)
Basophils Relative: 1.3 % (ref 0.0–3.0)
Eosinophils Absolute: 0.6 10*3/uL (ref 0.0–0.7)
Eosinophils Relative: 9.7 % — ABNORMAL HIGH (ref 0.0–5.0)
HCT: 38.3 % (ref 36.0–46.0)
Hemoglobin: 13 g/dL (ref 12.0–15.0)
Lymphocytes Relative: 36.1 % (ref 12.0–46.0)
Lymphs Abs: 2.2 10*3/uL (ref 0.7–4.0)
MCHC: 34 g/dL (ref 30.0–36.0)
MCV: 89.7 fl (ref 78.0–100.0)
Monocytes Absolute: 0.4 10*3/uL (ref 0.1–1.0)
Monocytes Relative: 7.3 % (ref 3.0–12.0)
Neutro Abs: 2.8 10*3/uL (ref 1.4–7.7)
Neutrophils Relative %: 45.6 % (ref 43.0–77.0)
Platelets: 214 10*3/uL (ref 150.0–400.0)
RBC: 4.27 Mil/uL (ref 3.87–5.11)
RDW: 12.7 % (ref 11.5–14.6)
WBC: 6.2 10*3/uL (ref 4.5–10.5)

## 2011-05-24 MED ORDER — ZOLEDRONIC ACID 5 MG/100ML IV SOLN: 5.0000 mg | Freq: Once | INTRAVENOUS | Status: DC

## 2011-05-24 NOTE — Progress Notes (Signed)
Subjective:    [facility] is a 66 y.o. female who presents for a welcome to Medicare exam.   Cardiac risk factors: advanced age (older than 66 for men, 29 for women) and family history of premature cardiovascular disease.  Activities of Daily Living  In your present state of health, do you have any difficulty performing the following activities?:  Preparing food and eating?: No Bathing yourself: No Getting dressed: No Using the toilet:No Moving around from place to place: No In the past year have you fallen or had a near fall?:No  Current exercise habits: Gym/ health club routine includes cardio, yoga and water aerobics.   Dietary issues discussed: na   Depression Screen (Note: if answer to either of the following is "Yes", then a more complete depression screening is indicated)  Q1: Over the past two weeks, have you felt down, depressed or hopeless?no Q2: Over the past two weeks, have you felt little interest or pleasure in doing things? no   The following portions of the patient's history were reviewed and updated as appropriate: allergies, current medications, past family history, past medical history, past social history, past surgical history and problem list. Review of Systems  Review of Systems  Constitutional: Negative for activity change, appetite change and fatigue.  HENT: Negative for hearing loss, congestion, tinnitus and ear discharge.   Eyes: Negative for visual disturbance (see optho q1y -- vision corrected to 20/20 with glasses).  Respiratory: Negative for cough, chest tightness and shortness of breath.   Cardiovascular: Negative for chest pain, palpitations and leg swelling.  Gastrointestinal: Negative for abdominal pain, diarrhea, constipation and abdominal distention.  Genitourinary: Negative for urgency, frequency, decreased urine volume and difficulty urinating.  Musculoskeletal: Negative for back pain, arthralgias and gait problem.  Skin: Negative  for color change, pallor and rash.  Neurological: Negative for dizziness, light-headedness, numbness and headaches.  Hematological: Negative for adenopathy. Does not bruise/bleed easily.  Psychiatric/Behavioral: Negative for suicidal ideas, confusion, sleep disturbance, self-injury, dysphoric mood, decreased concentration and agitation.  Pt is able to read and write and can do all ADLs No risk for falling No abuse/ violence in home   Pt physicians----  Gyn-- jacobs                              opth- Popa                               Dentist-- droseback                               GI--brodie   Objective:     Vision by Snellen chart: opth Blood pressure 124/72, pulse 70, temperature 97.9 F (36.6 C), temperature source Oral, weight 143 lb 12.8 oz (65.227 kg), SpO2 98.00%. There is no height on file to calculate BMI. BP 124/72  Pulse 70  Temp(Src) 97.9 F (36.6 C) (Oral)  Wt 143 lb 12.8 oz (65.227 kg)  SpO2 98% General appearance: alert, cooperative, appears stated age and no distress Head: Normocephalic, without obvious abnormality, atraumatic Eyes: conjunctivae/corneas clear. PERRL, EOM's intact. Fundi benign. Ears: normal TM's and external ear canals both ears Nose: Nares normal. Septum midline. Mucosa normal. No drainage or sinus tenderness. Throat: lips, mucosa, and tongue normal; teeth and gums normal Neck: no adenopathy, no carotid bruit, no JVD, supple,  symmetrical, trachea midline and thyroid not enlarged, symmetric, no tenderness/mass/nodules Back: symmetric, no curvature. ROM normal. No CVA tenderness. Lungs: clear to auscultation bilaterally Breasts: gyn Heart: regular rate and rhythm, S1, S2 normal, no murmur, click, rub or gallop Abdomen: soft, non-tender; bowel sounds normal; no masses,  no organomegaly Pelvic: gyn Extremities: extremities normal, atraumatic, no cyanosis or edema Pulses: 2+ and symmetric Skin: Skin color, texture, turgor normal. No rashes or  lesions Lymph nodes: Cervical, supraclavicular, and axillary nodes normal. Neurologic: Alert and oriented X 3, normal strength and tone. Normal symmetric reflexes. Normal coordination and gait psych--no depression or anxiety    Assessment:    cpe     Plan:     During the course of the visit the patient was educated and counseled about appropriate screening and preventive services including:   Pneumococcal vaccine   Influenza vaccine  Td vaccine  Screening electrocardiogram  Screening mammography  Screening Pap smear and pelvic exam   Bone densitometry screening  Colorectal cancer screening  Advanced directives: has an advanced directive - a copy HAS NOT been provided.  Pt refused all vaccines  Patient Instructions (the written plan) was given to the patient.

## 2011-05-24 NOTE — Assessment & Plan Note (Signed)
Per gyn On reclast

## 2011-05-24 NOTE — Patient Instructions (Signed)
Preventive Care for Adults, Female A healthy lifestyle and preventive care can promote health and wellness. Preventive health guidelines for women include the following key practices.  A routine yearly physical is a good way to check with your caregiver about your health and preventive screening. It is a chance to share any concerns and updates on your health, and to receive a thorough exam.   Visit your dentist for a routine exam and preventive care every 6 months. Brush your teeth twice a day and floss once a day. Good oral hygiene prevents tooth decay and gum disease.   The frequency of eye exams is based on your age, health, family medical history, use of contact lenses, and other factors. Follow your caregiver's recommendations for frequency of eye exams.   Eat a healthy diet. Foods like vegetables, fruits, whole grains, low-fat dairy products, and lean protein foods contain the nutrients you need without too many calories. Decrease your intake of foods high in solid fats, added sugars, and salt. Eat the right amount of calories for you.Get information about a proper diet from your caregiver, if necessary.   Regular physical exercise is one of the most important things you can do for your health. Most adults should get at least 150 minutes of moderate-intensity exercise (any activity that increases your heart rate and causes you to sweat) each week. In addition, most adults need muscle-strengthening exercises on 2 or more days a week.   Maintain a healthy weight. The body mass index (BMI) is a screening tool to identify possible weight problems. It provides an estimate of body fat based on height and weight. Your caregiver can help determine your BMI, and can help you achieve or maintain a healthy weight.For adults 20 years and older:   A BMI below 18.5 is considered underweight.   A BMI of 18.5 to 24.9 is normal.   A BMI of 25 to 29.9 is considered overweight.   A BMI of 30 and above is  considered obese.   Maintain normal blood lipids and cholesterol levels by exercising and minimizing your intake of saturated fat. Eat a balanced diet with plenty of fruit and vegetables. Blood tests for lipids and cholesterol should begin at age 20 and be repeated every 5 years. If your lipid or cholesterol levels are high, you are over 50, or you are at high risk for heart disease, you may need your cholesterol levels checked more frequently.Ongoing high lipid and cholesterol levels should be treated with medicines if diet and exercise are not effective.   If you smoke, find out from your caregiver how to quit. If you do not use tobacco, do not start.   If you are pregnant, do not drink alcohol. If you are breastfeeding, be very cautious about drinking alcohol. If you are not pregnant and choose to drink alcohol, do not exceed 1 drink per day. One drink is considered to be 12 ounces (355 mL) of beer, 5 ounces (148 mL) of wine, or 1.5 ounces (44 mL) of liquor.   Avoid use of street drugs. Do not share needles with anyone. Ask for help if you need support or instructions about stopping the use of drugs.   High blood pressure causes heart disease and increases the risk of stroke. Your blood pressure should be checked at least every 1 to 2 years. Ongoing high blood pressure should be treated with medicines if weight loss and exercise are not effective.   If you are 55 to 66   years old, ask your caregiver if you should take aspirin to prevent strokes.   Diabetes screening involves taking a blood sample to check your fasting blood sugar level. This should be done once every 3 years, after age 45, if you are within normal weight and without risk factors for diabetes. Testing should be considered at a younger age or be carried out more frequently if you are overweight and have at least 1 risk factor for diabetes.   Breast cancer screening is essential preventive care for women. You should practice "breast  self-awareness." This means understanding the normal appearance and feel of your breasts and may include breast self-examination. Any changes detected, no matter how small, should be reported to a caregiver. Women in their 20s and 30s should have a clinical breast exam (CBE) by a caregiver as part of a regular health exam every 1 to 3 years. After age 40, women should have a CBE every year. Starting at age 40, women should consider having a mammography (breast X-ray test) every year. Women who have a family history of breast cancer should talk to their caregiver about genetic screening. Women at a high risk of breast cancer should talk to their caregivers about having magnetic resonance imaging (MRI) and a mammography every year.   The Pap test is a screening test for cervical cancer. A Pap test can show cell changes on the cervix that might become cervical cancer if left untreated. A Pap test is a procedure in which cells are obtained and examined from the lower end of the uterus (cervix).   Women should have a Pap test starting at age 21.   Between ages 21 and 29, Pap tests should be repeated every 2 years.   Beginning at age 30, you should have a Pap test every 3 years as long as the past 3 Pap tests have been normal.   Some women have medical problems that increase the chance of getting cervical cancer. Talk to your caregiver about these problems. It is especially important to talk to your caregiver if a new problem develops soon after your last Pap test. In these cases, your caregiver may recommend more frequent screening and Pap tests.   The above recommendations are the same for women who have or have not gotten the vaccine for human papillomavirus (HPV).   If you had a hysterectomy for a problem that was not cancer or a condition that could lead to cancer, then you no longer need Pap tests. Even if you no longer need a Pap test, a regular exam is a good idea to make sure no other problems are  starting.   If you are between ages 65 and 70, and you have had normal Pap tests going back 10 years, you no longer need Pap tests. Even if you no longer need a Pap test, a regular exam is a good idea to make sure no other problems are starting.   If you have had past treatment for cervical cancer or a condition that could lead to cancer, you need Pap tests and screening for cancer for at least 20 years after your treatment.   If Pap tests have been discontinued, risk factors (such as a new sexual partner) need to be reassessed to determine if screening should be resumed.   The HPV test is an additional test that may be used for cervical cancer screening. The HPV test looks for the virus that can cause the cell changes on the cervix.   The cells collected during the Pap test can be tested for HPV. The HPV test could be used to screen women aged 30 years and older, and should be used in women of any age who have unclear Pap test results. After the age of 30, women should have HPV testing at the same frequency as a Pap test.   Colorectal cancer can be detected and often prevented. Most routine colorectal cancer screening begins at the age of 50 and continues through age 75. However, your caregiver may recommend screening at an earlier age if you have risk factors for colon cancer. On a yearly basis, your caregiver may provide home test kits to check for hidden blood in the stool. Use of a small camera at the end of a tube, to directly examine the colon (sigmoidoscopy or colonoscopy), can detect the earliest forms of colorectal cancer. Talk to your caregiver about this at age 50, when routine screening begins. Direct examination of the colon should be repeated every 5 to 10 years through age 75, unless early forms of pre-cancerous polyps or small growths are found.   Hepatitis C blood testing is recommended for all people born from 1945 through 1965 and any individual with known risks for hepatitis C.    Practice safe sex. Use condoms and avoid high-risk sexual practices to reduce the spread of sexually transmitted infections (STIs). STIs include gonorrhea, chlamydia, syphilis, trichomonas, herpes, HPV, and human immunodeficiency virus (HIV). Herpes, HIV, and HPV are viral illnesses that have no cure. They can result in disability, cancer, and death. Sexually active women aged 25 and younger should be checked for chlamydia. Older women with new or multiple partners should also be tested for chlamydia. Testing for other STIs is recommended if you are sexually active and at increased risk.   Osteoporosis is a disease in which the bones lose minerals and strength with aging. This can result in serious bone fractures. The risk of osteoporosis can be identified using a bone density scan. Women ages 65 and over and women at risk for fractures or osteoporosis should discuss screening with their caregivers. Ask your caregiver whether you should take a calcium supplement or vitamin D to reduce the rate of osteoporosis.   Menopause can be associated with physical symptoms and risks. Hormone replacement therapy is available to decrease symptoms and risks. You should talk to your caregiver about whether hormone replacement therapy is right for you.   Use sunscreen with sun protection factor (SPF) of 30 or more. Apply sunscreen liberally and repeatedly throughout the day. You should seek shade when your shadow is shorter than you. Protect yourself by wearing long sleeves, pants, a wide-brimmed hat, and sunglasses year round, whenever you are outdoors.   Once a month, do a whole body skin exam, using a mirror to look at the skin on your back. Notify your caregiver of new moles, moles that have irregular borders, moles that are larger than a pencil eraser, or moles that have changed in shape or color.   Stay current with required immunizations.   Influenza. You need a dose every fall (or winter). The composition of  the flu vaccine changes each year, so being vaccinated once is not enough.   Pneumococcal polysaccharide. You need 1 to 2 doses if you smoke cigarettes or if you have certain chronic medical conditions. You need 1 dose at age 65 (or older) if you have never been vaccinated.   Tetanus, diphtheria, pertussis (Tdap, Td). Get 1 dose of   Tdap vaccine if you are younger than age 65, are over 65 and have contact with an infant, are a healthcare worker, are pregnant, or simply want to be protected from whooping cough. After that, you need a Td booster dose every 10 years. Consult your caregiver if you have not had at least 3 tetanus and diphtheria-containing shots sometime in your life or have a deep or dirty wound.   HPV. You need this vaccine if you are a woman age 26 or younger. The vaccine is given in 3 doses over 6 months.   Measles, mumps, rubella (MMR). You need at least 1 dose of MMR if you were born in 1957 or later. You may also need a second dose.   Meningococcal. If you are age 19 to 21 and a first-year college student living in a residence hall, or have one of several medical conditions, you need to get vaccinated against meningococcal disease. You may also need additional booster doses.   Zoster (shingles). If you are age 60 or older, you should get this vaccine.   Varicella (chickenpox). If you have never had chickenpox or you were vaccinated but received only 1 dose, talk to your caregiver to find out if you need this vaccine.   Hepatitis A. You need this vaccine if you have a specific risk factor for hepatitis A virus infection or you simply wish to be protected from this disease. The vaccine is usually given as 2 doses, 6 to 18 months apart.   Hepatitis B. You need this vaccine if you have a specific risk factor for hepatitis B virus infection or you simply wish to be protected from this disease. The vaccine is given in 3 doses, usually over 6 months.  Preventive Services /  Frequency Ages 19 to 39  Blood pressure check.** / Every 1 to 2 years.   Lipid and cholesterol check.** / Every 5 years beginning at age 20.   Clinical breast exam.** / Every 3 years for women in their 20s and 30s.   Pap test.** / Every 2 years from ages 21 through 29. Every 3 years starting at age 30 through age 65 or 70 with a history of 3 consecutive normal Pap tests.   HPV screening.** / Every 3 years from ages 30 through ages 65 to 70 with a history of 3 consecutive normal Pap tests.   Hepatitis C blood test.** / For any individual with known risks for hepatitis C.   Skin self-exam. / Monthly.   Influenza immunization.** / Every year.   Pneumococcal polysaccharide immunization.** / 1 to 2 doses if you smoke cigarettes or if you have certain chronic medical conditions.   Tetanus, diphtheria, pertussis (Tdap, Td) immunization. / A one-time dose of Tdap vaccine. After that, you need a Td booster dose every 10 years.   HPV immunization. / 3 doses over 6 months, if you are 26 and younger.   Measles, mumps, rubella (MMR) immunization. / You need at least 1 dose of MMR if you were born in 1957 or later. You may also need a second dose.   Meningococcal immunization. / 1 dose if you are age 19 to 21 and a first-year college student living in a residence hall, or have one of several medical conditions, you need to get vaccinated against meningococcal disease. You may also need additional booster doses.   Varicella immunization.** / Consult your caregiver.   Hepatitis A immunization.** / Consult your caregiver. 2 doses, 6 to 18 months   apart.   Hepatitis B immunization.** / Consult your caregiver. 3 doses usually over 6 months.  Ages 40 to 64  Blood pressure check.** / Every 1 to 2 years.   Lipid and cholesterol check.** / Every 5 years beginning at age 20.   Clinical breast exam.** / Every year after age 40.   Mammogram.** / Every year beginning at age 40 and continuing for as  long as you are in good health. Consult with your caregiver.   Pap test.** / Every 3 years starting at age 30 through age 65 or 70 with a history of 3 consecutive normal Pap tests.   HPV screening.** / Every 3 years from ages 30 through ages 65 to 70 with a history of 3 consecutive normal Pap tests.   Fecal occult blood test (FOBT) of stool. / Every year beginning at age 50 and continuing until age 75. You may not need to do this test if you get a colonoscopy every 10 years.   Flexible sigmoidoscopy or colonoscopy.** / Every 5 years for a flexible sigmoidoscopy or every 10 years for a colonoscopy beginning at age 50 and continuing until age 75.   Hepatitis C blood test.** / For all people born from 1945 through 1965 and any individual with known risks for hepatitis C.   Skin self-exam. / Monthly.   Influenza immunization.** / Every year.   Pneumococcal polysaccharide immunization.** / 1 to 2 doses if you smoke cigarettes or if you have certain chronic medical conditions.   Tetanus, diphtheria, pertussis (Tdap, Td) immunization.** / A one-time dose of Tdap vaccine. After that, you need a Td booster dose every 10 years.   Measles, mumps, rubella (MMR) immunization. / You need at least 1 dose of MMR if you were born in 1957 or later. You may also need a second dose.   Varicella immunization.** / Consult your caregiver.   Meningococcal immunization.** / Consult your caregiver.   Hepatitis A immunization.** / Consult your caregiver. 2 doses, 6 to 18 months apart.   Hepatitis B immunization.** / Consult your caregiver. 3 doses, usually over 6 months.  Ages 65 and over  Blood pressure check.** / Every 1 to 2 years.   Lipid and cholesterol check.** / Every 5 years beginning at age 20.   Clinical breast exam.** / Every year after age 40.   Mammogram.** / Every year beginning at age 40 and continuing for as long as you are in good health. Consult with your caregiver.   Pap test.** /  Every 3 years starting at age 30 through age 65 or 70 with a 3 consecutive normal Pap tests. Testing can be stopped between 65 and 70 with 3 consecutive normal Pap tests and no abnormal Pap or HPV tests in the past 10 years.   HPV screening.** / Every 3 years from ages 30 through ages 65 or 70 with a history of 3 consecutive normal Pap tests. Testing can be stopped between 65 and 70 with 3 consecutive normal Pap tests and no abnormal Pap or HPV tests in the past 10 years.   Fecal occult blood test (FOBT) of stool. / Every year beginning at age 50 and continuing until age 75. You may not need to do this test if you get a colonoscopy every 10 years.   Flexible sigmoidoscopy or colonoscopy.** / Every 5 years for a flexible sigmoidoscopy or every 10 years for a colonoscopy beginning at age 50 and continuing until age 75.   Hepatitis   C blood test.** / For all people born from 1945 through 1965 and any individual with known risks for hepatitis C.   Osteoporosis screening.** / A one-time screening for women ages 65 and over and women at risk for fractures or osteoporosis.   Skin self-exam. / Monthly.   Influenza immunization.** / Every year.   Pneumococcal polysaccharide immunization.** / 1 dose at age 65 (or older) if you have never been vaccinated.   Tetanus, diphtheria, pertussis (Tdap, Td) immunization. / A one-time dose of Tdap vaccine if you are over 65 and have contact with an infant, are a healthcare worker, or simply want to be protected from whooping cough. After that, you need a Td booster dose every 10 years.   Varicella immunization.** / Consult your caregiver.   Meningococcal immunization.** / Consult your caregiver.   Hepatitis A immunization.** / Consult your caregiver. 2 doses, 6 to 18 months apart.   Hepatitis B immunization.** / Check with your caregiver. 3 doses, usually over 6 months.  ** Family history and personal history of risk and conditions may change your caregiver's  recommendations. Document Released: 02/28/2001 Document Revised: 12/22/2010 Document Reviewed: 05/30/2010 [employer] Patient Information 2012 [employer]. 

## 2012-03-02 ENCOUNTER — Other Ambulatory Visit: Payer: Self-pay

## 2012-08-21 ENCOUNTER — Other Ambulatory Visit: Payer: Self-pay

## 2013-04-15 ENCOUNTER — Encounter (HOSPITAL_COMMUNITY): Payer: Self-pay | Admitting: Emergency Medicine

## 2013-04-15 ENCOUNTER — Emergency Department (HOSPITAL_COMMUNITY)
Admission: EM | Admit: 2013-04-15 | Discharge: 2013-04-15 | Disposition: A | Discharge status: Home / Self Care | Payer: Medicare Other | Attending: Emergency Medicine | Admitting: Emergency Medicine

## 2013-04-15 DIAGNOSIS — R6883 Chills (without fever): Secondary | ICD-10-CM | POA: Insufficient documentation

## 2013-04-15 DIAGNOSIS — M25569 Pain in unspecified knee: Secondary | ICD-10-CM

## 2013-04-15 DIAGNOSIS — M064 Inflammatory polyarthropathy: Secondary | ICD-10-CM | POA: Insufficient documentation

## 2013-04-15 DIAGNOSIS — G8918 Other acute postprocedural pain: Secondary | ICD-10-CM | POA: Insufficient documentation

## 2013-04-15 DIAGNOSIS — Z8659 Personal history of other mental and behavioral disorders: Secondary | ICD-10-CM | POA: Insufficient documentation

## 2013-04-15 DIAGNOSIS — Z79899 Other long term (current) drug therapy: Secondary | ICD-10-CM | POA: Insufficient documentation

## 2013-04-15 DIAGNOSIS — Z880 Allergy status to penicillin: Secondary | ICD-10-CM | POA: Insufficient documentation

## 2013-04-15 DIAGNOSIS — M899 Disorder of bone, unspecified: Secondary | ICD-10-CM | POA: Insufficient documentation

## 2013-04-15 DIAGNOSIS — R110 Nausea: Secondary | ICD-10-CM | POA: Insufficient documentation

## 2013-04-15 DIAGNOSIS — M949 Disorder of cartilage, unspecified: Secondary | ICD-10-CM

## 2013-04-15 MED ORDER — HYDROCODONE-ACETAMINOPHEN 5-325 MG PO TABS
2.0000 | ORAL_TABLET | Freq: Once | ORAL | Status: AC
  Administered 2013-04-15: 2 via ORAL
  Filled 2013-04-15: qty 2

## 2013-04-15 MED ORDER — ONDANSETRON 4 MG PO TBDP
4.0000 mg | ORAL_TABLET | Freq: Once | ORAL | Status: AC
  Administered 2013-04-15: 4 mg via ORAL
  Filled 2013-04-15: qty 1

## 2013-04-15 MED ORDER — HYDROCODONE-ACETAMINOPHEN 5-325 MG PO TABS: ORAL_TABLET | ORAL | Status: DC

## 2013-04-15 NOTE — ED Notes (Signed)
Pt got gel injection into knees bilaterally yesterday; history of same; severe pain today

## 2013-04-15 NOTE — ED Provider Notes (Signed)
Medical screening examination/treatment/procedure(s) were performed by non-physician practitioner and as supervising physician I was immediately available for consultation/collaboration.   EKG Interpretation None        Mervin Kung, MD 04/15/13 [phone]

## 2013-04-15 NOTE — Discharge Instructions (Signed)
Please read and follow all provided instructions.  Your diagnoses today include:  1. Knee pain     Tests performed today include:  Vital signs. See below for your results today.   Medications prescribed:   Vicodin (hydrocodone/acetaminophen) - narcotic pain medication  DO NOT drive or perform any activities that require you to be awake and alert because this medicine can make you drowsy. BE VERY CAREFUL not to take multiple medicines containing Tylenol (also called acetaminophen). Doing so can lead to an overdose which can damage your liver and cause liver failure and possibly death.   Ibuprofen (Motrin, Advil) - anti-inflammatory pain medication  Do not exceed 600mg  ibuprofen every 6 hours, take with food  You have been prescribed an anti-inflammatory medication or NSAID. Take with food. Take smallest effective dose for the shortest duration needed for your pain. Stop taking if you experience stomach pain or vomiting.   Take any prescribed medications only as directed.  Home care instructions:   Follow any educational materials contained in this packet  Take 600mg  ibuprofen every 6 hrs  Follow R.I.C.E. Protocol:  R - rest your injury   I  - use ice on injury without applying directly to skin  C - compress injury with bandage or splint  E - elevate the injury as much as possible  Follow-up instructions: Please follow-up with your primary care provider or the provided orthopedic physician (bone specialist) in 1 week.  If you do not have a primary care doctor -- see below for referral information.   Return instructions:   Please return if your toes are numb or tingling, appear gray or blue, or you have severe pain (also elevate leg and loosen splint or wrap if you were given one)  Please return to the Emergency Department if you experience worsening symptoms.   Please return if you have any other emergent concerns.  Additional Information:  Your vital signs today  were: BP 124/70   Pulse 110   Temp(Src) 98.5 F (36.9 C)   Resp 20   SpO2 100% If your blood pressure (BP) was elevated above 135/85 this visit, please have this repeated by your doctor within one month. --------------

## 2013-04-15 NOTE — ED Provider Notes (Signed)
CSN: [id number]     Arrival date & time 04/15/13  5009 History   First MD Initiated Contact with Patient 04/15/13 1012     Chief Complaint  Patient presents with  . Knee Pain     (Consider location/radiation/quality/duration/timing/severity/associated sxs/prior Treatment) HPI Comments: Patients with history of osteoarthritis in the presents with complaint of bilateral knee pain. That the pain began yesterday after she received an injection of gel (suspect hyaluronate? -- patient doesn't remember name of injections) into her knee joints. She receives a series of 3 injections into each knee separated by a week. She gets these once a year. Patient has had mild soreness with these injections in the past but never to this extent. This morning she was unable to get out of bed or bends her knees. Ambulance was called to transport her to the hospital. She did not contact her orthopedist prior. She took ibuprofen last night without relief. She has had chills but no fever. She has had nausea but no vomiting. No redness or swelling of the knees. Onset of symptoms gradual. Course is constant. Nothing makes symptoms better.  The history is provided by the patient.    Past Medical History  Diagnosis Date  . Depression   . Osteoporosis     Gets Reclast   Past Surgical History  Procedure Laterality Date  . Hernia repair  2/08  . Knee surgery     Family History  Problem Relation Age of Onset  . Coronary artery disease    . Stroke Mother   . Hypertension Mother   . Diabetes Brother   . Heart disease Brother   . Diabetes Maternal Grandmother   . Diabetes Maternal Grandfather    History  Substance Use Topics  . Smoking status: Never Smoker   . Smokeless tobacco: Never Used  . Alcohol Use: Yes     Comment: Occ   OB History   Grav Para Term Preterm Abortions TAB SAB Ect Mult Living                 Review of Systems  Constitutional: Positive for chills and activity change. Negative for  fever.  Gastrointestinal: Positive for nausea. Negative for vomiting.  Musculoskeletal: Positive for arthralgias. Negative for back pain, joint swelling and neck pain.  Skin: Negative for wound.  Neurological: Negative for weakness and numbness.      Allergies  Codeine; Penicillins; and Sulfonamide derivatives  Home Medications   Current Outpatient Rx  Name  Route  Sig  Dispense  Refill  . Calcium Carbonate (CALTRATE 600 PO)   Oral   Take by mouth.         . cholecalciferol (VITAMIN D) 1000 UNITS tablet   Oral   Take 1,000 Units by mouth daily.         . vitamin C (ASCORBIC ACID) 500 MG tablet   Oral   Take 500 mg by mouth daily.         . zoledronic acid (RECLAST) 5 MG/100ML SOLN   Intravenous   Inject 100 mLs (5 mg total) into the vein once.   100 mL   0    BP 124/70  Pulse 110  Temp(Src) 98.5 F (36.9 C)  Resp 20  SpO2 100%  Physical Exam  Nursing note and vitals reviewed. Constitutional: She appears well-developed and well-nourished.  HENT:  Head: Normocephalic and atraumatic.  Eyes: Pupils are equal, round, and reactive to light.  Neck: Normal range of motion. Neck supple.  Cardiovascular: Exam reveals no decreased pulses.   Musculoskeletal: She exhibits tenderness. She exhibits no edema.       Right hip: Normal.       Left hip: Normal.       Right knee: She exhibits decreased range of motion. She exhibits no swelling, no effusion and no erythema. Tenderness found. Medial joint line and lateral joint line tenderness noted.       Left knee: She exhibits decreased range of motion. She exhibits no swelling, no effusion and no erythema. Tenderness found. Medial joint line and lateral joint line tenderness noted.       Right ankle: Normal.       Left ankle: Normal.       Lumbar back: Normal.  Neurological: She is alert. No sensory deficit.  Motor, sensation, and vascular distal to the injury is fully intact.   Skin: Skin is warm and dry.   Psychiatric: She has a normal mood and affect.    ED Course  Procedures (including critical care time) Labs Review Labs Reviewed - No data to display Imaging Review No results found.   EKG Interpretation None      10:26 AM Patient seen and examined. Work-up initiated. Medications ordered. D/w Dr. Rogene Houston. Will call ortho for reccs.   Vital signs reviewed and are as follows: Filed Vitals:   04/15/13 0950  BP: 124/70  Pulse: 110  Temp: 98.5 F (36.9 C)  Resp: 20   11:14 AM Spoke with on-call physician at [facility]. States sometimes patients will be come sensitized to these injections and have a severe inflammatory response. Reccs: RICE, NSAIDs, analgesia. They will likely d/c these injections.   Pt and husband informed. They agree with plan. Will discuss with ortho prior to next injections.   Unchanged on re-exam, but patient wants to go home.   Return with worsening, redness, swelling, fever. Patient verbalizes understanding and agrees with plan.   MDM   Final diagnoses:  Knee pain   Patient with inflammatory arthritis after injection yesterday. Symptoms are bilateral and there are no signs of septic arthritis. I have spoken with patient orthopedic group. They recommend rice protocol, NSAIDs, reassurance, and analgesia as needed. Patient and husband are in agreement with this plan. Appropriate return instructions given. They will discuss future injections with orthopedist.    Carlisle Cater, PA-C 04/15/13 1121

## 2013-05-06 ENCOUNTER — Encounter: Payer: Self-pay | Admitting: Internal Medicine

## 2013-05-20 ENCOUNTER — Telehealth: Payer: Self-pay

## 2013-05-20 NOTE — Telephone Encounter (Signed)
Medication and allergies:  Reviewed and updated  90 day supply/mail order: n/a Local pharmacy:  [id number] - [place], [address] [place]   Immunizations due:  PNA and Td/tdap?   A/P: No changes to personal, family history or past surgical hx PAP- 05/18/09- normal CCS- 05/24/06- benign colon polyps; repeat 07/2016  MMG- 01/11/12- negative  BD- 12/15/08  Flu- did not receive  Td- unsure of last vaccine PNA- has not received Z- 05/01/07   To Discuss with Provider: Nothing at this time.

## 2013-05-20 NOTE — Telephone Encounter (Signed)
Left message for call back Identifiable   Pap- 05/18/09- normal CCS- 05/24/06- benign colon polyps; repeat 07/2016 MMG- 01/11/12- negative BD- 12/15/08 Flu Td PNA Z- 05/01/07

## 2013-05-21 ENCOUNTER — Encounter: Payer: Self-pay | Admitting: Family Medicine

## 2013-05-21 ENCOUNTER — Ambulatory Visit (INDEPENDENT_AMBULATORY_CARE_PROVIDER_SITE_OTHER): Payer: 59 | Admitting: Family Medicine

## 2013-05-21 VITALS — BP 114/74 | HR 62 | Temp 98.1°F | Ht 65.0 in | Wt 146.8 lb

## 2013-05-21 DIAGNOSIS — Z136 Encounter for screening for cardiovascular disorders: Secondary | ICD-10-CM

## 2013-05-21 DIAGNOSIS — F329 Major depressive disorder, single episode, unspecified: Secondary | ICD-10-CM

## 2013-05-21 DIAGNOSIS — Z0000 Encounter for general adult medical examination without abnormal findings: Secondary | ICD-10-CM

## 2013-05-21 DIAGNOSIS — F3289 Other specified depressive episodes: Secondary | ICD-10-CM

## 2013-05-21 LAB — CBC WITH DIFFERENTIAL/PLATELET
Basophils Absolute: 0 10*3/uL (ref 0.0–0.1)
Basophils Relative: 0.5 % (ref 0.0–3.0)
Eosinophils Absolute: 0.7 10*3/uL (ref 0.0–0.7)
Eosinophils Relative: 11.7 % — ABNORMAL HIGH (ref 0.0–5.0)
HCT: 39.9 % (ref 36.0–46.0)
Hemoglobin: 13.3 g/dL (ref 12.0–15.0)
Lymphocytes Relative: 33.8 % (ref 12.0–46.0)
Lymphs Abs: 2.1 10*3/uL (ref 0.7–4.0)
MCHC: 33.4 g/dL (ref 30.0–36.0)
MCV: 90.5 fl (ref 78.0–100.0)
Monocytes Absolute: 0.4 10*3/uL (ref 0.1–1.0)
Monocytes Relative: 6 % (ref 3.0–12.0)
Neutro Abs: 3 10*3/uL (ref 1.4–7.7)
Neutrophils Relative %: 48 % (ref 43.0–77.0)
Platelets: 164 10*3/uL (ref 150.0–400.0)
RBC: 4.41 Mil/uL (ref 3.87–5.11)
RDW: 13.3 % (ref 11.5–15.5)
WBC: 6.2 10*3/uL (ref 4.0–10.5)

## 2013-05-21 LAB — BASIC METABOLIC PANEL
BUN: 10 mg/dL (ref 6–23)
CO2: 24 mEq/L (ref 19–32)
Calcium: 9.2 mg/dL (ref 8.4–10.5)
Chloride: 107 mEq/L (ref 96–112)
Creatinine, Ser: 0.9 mg/dL (ref 0.4–1.2)
GFR: 68.84 mL/min (ref >60.00)
Glucose, Bld: 81 mg/dL (ref 70–99)
Potassium: 4.3 mEq/L (ref 3.5–5.1)
Sodium: 140 mEq/L (ref 135–145)

## 2013-05-21 LAB — HEPATIC FUNCTION PANEL
ALT: 18 U/L (ref 0–35)
AST: 30 U/L (ref 0–37)
Albumin: 4.3 g/dL (ref 3.5–5.2)
Alkaline Phosphatase: 60 U/L (ref 39–117)
Bilirubin, Direct: 0.3 mg/dL (ref 0.0–0.3)
Total Bilirubin: 0.6 mg/dL (ref 0.2–1.2)
Total Protein: 7.8 g/dL (ref 6.0–8.3)

## 2013-05-21 LAB — POCT URINALYSIS DIPSTICK
Bilirubin, UA: NEGATIVE
Blood, UA: NEGATIVE
Glucose, UA: NEGATIVE
Ketones, UA: NEGATIVE
Leukocytes, UA: NEGATIVE
Nitrite, UA: NEGATIVE
Protein, UA: NEGATIVE
Spec Grav, UA: <=1.005
Urobilinogen, UA: 0.2
pH, UA: 7

## 2013-05-21 LAB — LIPID PANEL
Cholesterol: 165 mg/dL (ref 0–200)
HDL: 43.4 mg/dL (ref >39.00)
LDL Cholesterol: 106 mg/dL — ABNORMAL HIGH (ref 0–99)
Total CHOL/HDL Ratio: 4
Triglycerides: 78 mg/dL (ref 0.0–149.0)
VLDL: 15.6 mg/dL (ref 0.0–40.0)

## 2013-05-21 NOTE — Progress Notes (Signed)
Pre visit review using our clinic review tool, if applicable. No additional management support is needed unless otherwise documented below in the visit note. 

## 2013-05-21 NOTE — Progress Notes (Signed)
Subjective:    Charlotte Brooks is a 68 y.o. female who presents for Medicare Annual/Subsequent preventive examination.  Preventive Screening-Counseling & Management  Tobacco History  Smoking status  . Never Smoker   Smokeless tobacco  . Never Used     Problems Prior to Visit 1.   Current Problems (verified) Patient Active Problem List   Diagnosis Date Noted  . 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 DEPRESSION 05/31/2006    Medications Prior to Visit Current Outpatient Prescriptions on File Prior to Visit  Medication Sig Dispense Refill  . Calcium Carb-Cholecalciferol (CALTRATE 600+D) 600-800 MG-UNIT TABS Take 1 tablet by mouth daily.      Marland Kitchen desonide (DESOWEN) 0.05 % cream Apply 1 application topically daily.      . zoledronic acid (RECLAST) 5 MG/100ML SOLN injection Inject 5 mg into the vein once. Every year       No current facility-administered medications on file prior to visit.    Current Medications (verified) Current Outpatient Prescriptions  Medication Sig Dispense Refill  . Calcium Carb-Cholecalciferol (CALTRATE 600+D) 600-800 MG-UNIT TABS Take 1 tablet by mouth daily.      Marland Kitchen desonide (DESOWEN) 0.05 % cream Apply 1 application topically daily.      . zoledronic acid (RECLAST) 5 MG/100ML SOLN injection Inject 5 mg into the vein once. Every year       No current facility-administered medications for this visit.     Allergies (verified) Codeine; Penicillins; and Sulfonamide derivatives   PAST HISTORY  Family History Family History  Problem Relation Age of Onset  . Coronary artery disease    . Stroke Mother   . Hypertension Mother   . Diabetes Brother   . Heart disease Brother   . Diabetes Maternal Grandmother   . Diabetes Maternal Grandfather     Social History History  Substance Use Topics  . Smoking status: Never Smoker   . Smokeless tobacco: Never Used  . Alcohol Use: Yes    Comment: Occ     Are there smokers in your home (other than you)? No  Risk Factors Current exercise habits: pool, yoga, machines  Dietary issues discussed: na   Cardiac risk factors: advanced age (older than 67 for men, 69 for women).  Depression Screen (Note: if answer to either of the following is "Yes", a more complete depression screening is indicated)   Over the past two weeks, have you felt down, depressed or hopeless? No  Over the past two weeks, have you felt little interest or pleasure in doing things? No  Have you lost interest or pleasure in daily life? No  Do you often feel hopeless? No  Do you cry easily over simple problems? No  Activities of Daily Living In your present state of health, do you have any difficulty performing the following activities?:  Driving? No Managing money?  No Feeding yourself? No Getting from bed to chair? No Climbing a flight of stairs? No Preparing food and eating?: No Bathing or showering? No Getting dressed: No Getting to the toilet? No Using the toilet:No Moving around from place to place: No In the past year have you fallen or had a near fall?:No   Are you sexually active?  Yes  Do you have more than one partner?  No  Hearing Difficulties: No Do you often ask people to speak up or repeat themselves? No Do you experience ringing or noises in your ears? No Do you  have difficulty understanding soft or whispered voices? No   Do you feel that you have a problem with memory? No  Do you often misplace items? No  Do you feel safe at home?  Yes  Cognitive Testing  Alert? Yes  Normal Appearance?Yes  Oriented to person? Yes  Place? Yes   Time? Yes  Recall of three objects?  Yes  Can perform simple calculations? Yes  Displays appropriate judgment?Yes  Can read the correct time from a watch face?Yes   Advanced Directives have been discussed with the patient? Yes  List the Names of Other Physician/Practitioners you currently  use: 1.  oph- POPA 2. Dentist- drosback 3.  Gyn- white 4. Derm- smith  Indicate any recent Medical Services you may have received from other than [facility] providers in the past year (date may be approximate).  Immunization History  Administered Date(s) Administered  . Zoster 05/01/2007    Screening Tests Health Maintenance  Topic Date Due  . Tetanus/tdap  06/29/1964  . Pneumococcal Polysaccharide Vaccine Age 5 And Over  06/30/2010  . Influenza Vaccine  08/16/2013  . Mammogram  01/10/2014  . Colonoscopy  05/23/2016  . Zostavax  Completed    All answers were reviewed with the patient and necessary referrals were made:  Garnet Koyanagi, DO   05/21/2013   History reviewed:  She  has a past medical history of Depression and Osteoporosis. She  does not have any pertinent problems on file. She  has past surgical history that includes Hernia repair (2/08) and Knee surgery. Her family history includes Coronary artery disease in an other family member; Diabetes in her brother, maternal grandfather, and maternal grandmother; Heart disease in her brother; Hypertension in her mother; Stroke in her mother. She  reports that she has never smoked. She has never used smokeless tobacco. She reports that she drinks alcohol. She reports that she does not use illicit drugs. She has a current medication list which includes the following prescription(s): calcium carb-cholecalciferol, desonide, and zoledronic acid. Current Outpatient Prescriptions on File Prior to Visit  Medication Sig Dispense Refill  . Calcium Carb-Cholecalciferol (CALTRATE 600+D) 600-800 MG-UNIT TABS Take 1 tablet by mouth daily.      Marland Kitchen desonide (DESOWEN) 0.05 % cream Apply 1 application topically daily.      . zoledronic acid (RECLAST) 5 MG/100ML SOLN injection Inject 5 mg into the vein once. Every year       No current facility-administered medications on file prior to visit.   She is allergic to codeine; penicillins; and sulfonamide  derivatives.  Review of Systems  Review of Systems  Constitutional: Negative for activity change, appetite change and fatigue.  HENT: Negative for hearing loss, congestion, tinnitus and ear discharge.   Eyes: Negative for visual disturbance (see optho q1y -- vision corrected to 20/20 with glasses).  Respiratory: Negative for cough, chest tightness and shortness of breath.   Cardiovascular: Negative for chest pain, palpitations and leg swelling.  Gastrointestinal: Negative for abdominal pain, diarrhea, constipation and abdominal distention.  Genitourinary: Negative for urgency, frequency, decreased urine volume and difficulty urinating.  Musculoskeletal: Negative for back pain, arthralgias and gait problem.  Skin: Negative for color change, pallor and rash.  Neurological: Negative for dizziness, light-headedness, numbness and headaches.  Hematological: Negative for adenopathy. Does not bruise/bleed easily.  Psychiatric/Behavioral: Negative for suicidal ideas, confusion, sleep disturbance, self-injury, dysphoric mood, decreased concentration and agitation.  Pt is able to read and write and can do all ADLs No risk for falling No  abuse/ violence in home      Objective:     Vision by Snellen chart: opth  Body mass index is 24.43 kg/(m^2). BP 114/74  Pulse 62  Temp(Src) 98.1 F (36.7 C) (Oral)  Ht 5\' 5"  (1.651 m)  Wt 146 lb 12.8 oz (66.588 kg)  BMI 24.43 kg/m2  SpO2 98%  BP 114/74  Pulse 62  Temp(Src) 98.1 F (36.7 C) (Oral)  Ht 5\' 5"  (1.651 m)  Wt 146 lb 12.8 oz (66.588 kg)  BMI 24.43 kg/m2  SpO2 98% General appearance: alert, cooperative, appears stated age and no distress Head: Normocephalic, without obvious abnormality, atraumatic Eyes: conjunctivae/corneas clear. PERRL, EOM's intact. Fundi benign. Ears: normal TM's and external ear canals both ears Nose: Nares normal. Septum midline. Mucosa normal. No drainage or sinus tenderness. Throat: lips, mucosa, and tongue  normal; teeth and gums normal Neck: no adenopathy, no carotid bruit, no JVD, supple, symmetrical, trachea midline and thyroid not enlarged, symmetric, no tenderness/mass/nodules Back: symmetric, no curvature. ROM normal. No CVA tenderness. Lungs: clear to auscultation bilaterally Breasts: normal appearance, no masses or tenderness Heart: regular rate and rhythm, S1, S2 normal, no murmur, click, rub or gallop Abdomen: soft, non-tender; bowel sounds normal; no masses,  no organomegaly Pelvic: not indicated; post-menopausal, no abnormal Pap smears in past Extremities: extremities normal, atraumatic, no cyanosis or edema Pulses: 2+ and symmetric Skin: Skin color, texture, turgor normal. No rashes or lesions Lymph nodes: Cervical, supraclavicular, and axillary nodes normal. Neurologic: Alert and oriented X 3, normal strength and tone. Normal symmetric reflexes. Normal coordination and gait Psych-- no depression, no anxiety      Assessment:     cpe      Plan:    check labs  ghm utd During the course of the visit the patient was educated and counseled about appropriate screening and preventive services including:    Pneumococcal vaccine   Screening mammography  Bone densitometry screening  Colorectal cancer screening  Glaucoma screening  Diet review for nutrition referral? Yes ____  Not Indicated _x___   Patient Instructions (the written plan) was given to the patient.  Medicare Attestation I have personally reviewed: The patient's medical and social history Their use of alcohol, tobacco or illicit drugs Their current medications and supplements The patient's functional ability including ADLs,fall risks, home safety risks, cognitive, and hearing and visual impairment Diet and physical activities Evidence for depression or mood disorders  The patient's weight, height, BMI, and visual acuity have been recorded in the chart.  I have made referrals, counseling, and provided  education to the patient based on review of the above and I have provided the patient with a written personalized care plan for preventive services.    1. Ischemic heart disease screen  - Basic metabolic panel - CBC with Differential - Hepatic function panel - Lipid panel - POCT urinalysis dipstick  2. Preventative health care  Garnet Koyanagi, DO   05/21/2013

## 2013-05-21 NOTE — Patient Instructions (Signed)
Preventive Care for Adults, Female A healthy lifestyle and preventive care can promote health and wellness. Preventive health guidelines for women include the following key practices.  A routine yearly physical is a good way to check with your health care provider about your health and preventive screening. It is a chance to share any concerns and updates on your health and to receive a thorough exam.  Visit your dentist for a routine exam and preventive care every 6 months. Brush your teeth twice a day and floss once a day. Good oral hygiene prevents tooth decay and gum disease.  The frequency of eye exams is based on your age, health, family medical history, use of contact lenses, and other factors. Follow your health care provider's recommendations for frequency of eye exams.  Eat a healthy diet. Foods like vegetables, fruits, whole grains, low-fat dairy products, and lean protein foods contain the nutrients you need without too many calories. Decrease your intake of foods high in solid fats, added sugars, and salt. Eat the right amount of calories for you.Get information about a proper diet from your health care provider, if necessary.  Regular physical exercise is one of the most important things you can do for your health. Most adults should get at least 150 minutes of moderate-intensity exercise (any activity that increases your heart rate and causes you to sweat) each week. In addition, most adults need muscle-strengthening exercises on 2 or more days a week.  Maintain a healthy weight. The body mass index (BMI) is a screening tool to identify possible weight problems. It provides an estimate of body fat based on height and weight. Your health care provider can find your BMI, and can help you achieve or maintain a healthy weight.For adults 20 years and older:  A BMI below 18.5 is considered underweight.  A BMI of 18.5 to 24.9 is normal.  A BMI of 25 to 29.9 is considered overweight.  A  BMI of 30 and above is considered obese.  Maintain normal blood lipids and cholesterol levels by exercising and minimizing your intake of saturated fat. Eat a balanced diet with plenty of fruit and vegetables. Blood tests for lipids and cholesterol should begin at age 62 and be repeated every 5 years. If your lipid or cholesterol levels are high, you are over 50, or you are at high risk for heart disease, you may need your cholesterol levels checked more frequently.Ongoing high lipid and cholesterol levels should be treated with medicines if diet and exercise are not working.  If you smoke, find out from your health care provider how to quit. If you do not use tobacco, do not start.  Lung cancer screening is recommended for adults aged 36 80 years who are at high risk for developing lung cancer because of a history of smoking. A yearly low-dose CT scan of the lungs is recommended for people who have at least a 30-pack-year history of smoking and are a current smoker or have quit within the past 15 years. A pack year of smoking is smoking an average of 1 pack of cigarettes a day for 1 year (for example: 1 pack a day for 30 years or 2 packs a day for 15 years). Yearly screening should continue until the smoker has stopped smoking for at least 15 years. Yearly screening should be stopped for people who develop a health problem that would prevent them from having lung cancer treatment.  If you are pregnant, do not drink alcohol. If you  are breastfeeding, be very cautious about drinking alcohol. If you are not pregnant and choose to drink alcohol, do not have more than 1 drink per day. One drink is considered to be 12 ounces (355 mL) of beer, 5 ounces (148 mL) of wine, or 1.5 ounces (44 mL) of liquor.  Avoid use of street drugs. Do not share needles with anyone. Ask for help if you need support or instructions about stopping the use of drugs.  High blood pressure causes heart disease and increases the risk  of stroke. Your blood pressure should be checked at least every 1 to 2 years. Ongoing high blood pressure should be treated with medicines if weight loss and exercise do not work.  If you are 37 68 years old, ask your health care provider if you should take aspirin to prevent strokes.  Diabetes screening involves taking a blood sample to check your fasting blood sugar level. This should be done once every 3 years, after age 59, if you are within normal weight and without risk factors for diabetes. Testing should be considered at a younger age or be carried out more frequently if you are overweight and have at least 1 risk factor for diabetes.  Breast cancer screening is essential preventive care for women. You should practice "breast self-awareness." This means understanding the normal appearance and feel of your breasts and may include breast self-examination. Any changes detected, no matter how small, should be reported to a health care provider. Women in their 55s and 30s should have a clinical breast exam (CBE) by a health care provider as part of a regular health exam every 1 to 3 years. After age 17, women should have a CBE every year. Starting at age 22, women should consider having a mammogram (breast X-ray test) every year. Women who have a family history of breast cancer should talk to their health care provider about genetic screening. Women at a high risk of breast cancer should talk to their health care providers about having an MRI and a mammogram every year.  Breast cancer gene (BRCA)-related cancer risk assessment is recommended for women who have family members with BRCA-related cancers. BRCA-related cancers include breast, ovarian, tubal, and peritoneal cancers. Having family members with these cancers may be associated with an increased risk for harmful changes (mutations) in the breast cancer genes BRCA1 and BRCA2. Results of the assessment will determine the need for genetic counseling  and BRCA1 and BRCA2 testing.  The Pap test is a screening test for cervical cancer. A Pap test can show cell changes on the cervix that might become cervical cancer if left untreated. A Pap test is a procedure in which cells are obtained and examined from the lower end of the uterus (cervix).  Women should have a Pap test starting at age 32.  Between ages 62 and 33, Pap tests should be repeated every 2 years.  Beginning at age 95, you should have a Pap test every 3 years as long as the past 3 Pap tests have been normal.  Some women have medical problems that increase the chance of getting cervical cancer. Talk to your health care provider about these problems. It is especially important to talk to your health care provider if a new problem develops soon after your last Pap test. In these cases, your health care provider may recommend more frequent screening and Pap tests.  The above recommendations are the same for women who have or have not gotten the vaccine  for human papillomavirus (HPV).  If you had a hysterectomy for a problem that was not cancer or a condition that could lead to cancer, then you no longer need Pap tests. Even if you no longer need a Pap test, a regular exam is a good idea to make sure no other problems are starting.  If you are between ages 58 and 10 years, and you have had normal Pap tests going back 10 years, you no longer need Pap tests. Even if you no longer need a Pap test, a regular exam is a good idea to make sure no other problems are starting.  If you have had past treatment for cervical cancer or a condition that could lead to cancer, you need Pap tests and screening for cancer for at least 20 years after your treatment.  If Pap tests have been discontinued, risk factors (such as a new sexual partner) need to be reassessed to determine if screening should be resumed.  The HPV test is an additional test that may be used for cervical cancer screening. The HPV test  looks for the virus that can cause the cell changes on the cervix. The cells collected during the Pap test can be tested for HPV. The HPV test could be used to screen women aged 67 years and older, and should be used in women of any age who have unclear Pap test results. After the age of 65, women should have HPV testing at the same frequency as a Pap test.  Colorectal cancer can be detected and often prevented. Most routine colorectal cancer screening begins at the age of 25 years and continues through age 66 years. However, your health care provider may recommend screening at an earlier age if you have risk factors for colon cancer. On a yearly basis, your health care provider may provide home test kits to check for hidden blood in the stool. Use of a small camera at the end of a tube, to directly examine the colon (sigmoidoscopy or colonoscopy), can detect the earliest forms of colorectal cancer. Talk to your health care provider about this at age 79, when routine screening begins. Direct exam of the colon should be repeated every 5 10 years through age 47 years, unless early forms of pre-cancerous polyps or small growths are found.  People who are at an increased risk for hepatitis B should be screened for this virus. You are considered at high risk for hepatitis B if:  You were born in a country where hepatitis B occurs often. Talk with your health care provider about which countries are considered high risk.  Your parents were born in a high-risk country and you have not received a shot to protect against hepatitis B (hepatitis B vaccine).  You have HIV or AIDS.  You use needles to inject street drugs.  You live with, or have sex with, someone who has Hepatitis B.  You get hemodialysis treatment.  You take certain medicines for conditions like cancer, organ transplantation, and autoimmune conditions.  Hepatitis C blood testing is recommended for all people born from [birth year] through 1965 and  any individual with known risks for hepatitis C.  Practice safe sex. Use condoms and avoid high-risk sexual practices to reduce the spread of sexually transmitted infections (STIs). STIs include gonorrhea, chlamydia, syphilis, trichomonas, herpes, HPV, and human immunodeficiency virus (HIV). Herpes, HIV, and HPV are viral illnesses that have no cure. They can result in disability, cancer, and death. Sexually active women aged 66  years and younger should be checked for chlamydia. Older women with new or multiple partners should also be tested for chlamydia. Testing for other STIs is recommended if you are sexually active and at increased risk.  Osteoporosis is a disease in which the bones lose minerals and strength with aging. This can result in serious bone fractures or breaks. The risk of osteoporosis can be identified using a bone density scan. Women ages 20 years and over and women at risk for fractures or osteoporosis should discuss screening with their health care providers. Ask your health care provider whether you should take a calcium supplement or vitamin D to reduce the rate of osteoporosis.  Menopause can be associated with physical symptoms and risks. Hormone replacement therapy is available to decrease symptoms and risks. You should talk to your health care provider about whether hormone replacement therapy is right for you.  Use sunscreen. Apply sunscreen liberally and repeatedly throughout the day. You should seek shade when your shadow is shorter than you. Protect yourself by wearing long sleeves, pants, a wide-brimmed hat, and sunglasses year round, whenever you are outdoors.  Once a month, do a whole body skin exam, using a mirror to look at the skin on your back. Tell your health care provider of new moles, moles that have irregular borders, moles that are larger than a pencil eraser, or moles that have changed in shape or color.  Stay current with required vaccines  (immunizations).  Influenza vaccine. All adults should be immunized every year.  Tetanus, diphtheria, and acellular pertussis (Td, Tdap) vaccine. Pregnant women should receive 1 dose of Tdap vaccine during each pregnancy. The dose should be obtained regardless of the length of time since the last dose. Immunization is preferred during the 27th 36th week of gestation. An adult who has not previously received Tdap or who does not know her vaccine status should receive 1 dose of Tdap. This initial dose should be followed by tetanus and diphtheria toxoids (Td) booster doses every 10 years. Adults with an unknown or incomplete history of completing a 3-dose immunization series with Td-containing vaccines should begin or complete a primary immunization series including a Tdap dose. Adults should receive a Td booster every 10 years.  Varicella vaccine. An adult without evidence of immunity to varicella should receive 2 doses or a second dose if she has previously received 1 dose. Pregnant females who do not have evidence of immunity should receive the first dose after pregnancy. This first dose should be obtained before leaving the health care facility. The second dose should be obtained 4 8 weeks after the first dose.  Human papillomavirus (HPV) vaccine. Females aged 87 26 years who have not received the vaccine previously should obtain the 3-dose series. The vaccine is not recommended for use in pregnant females. However, pregnancy testing is not needed before receiving a dose. If a female is found to be pregnant after receiving a dose, no treatment is needed. In that case, the remaining doses should be delayed until after the pregnancy. Immunization is recommended for any person with an immunocompromised condition through the age of 78 years if she did not get any or all doses earlier. During the 3-dose series, the second dose should be obtained 4 8 weeks after the first dose. The third dose should be obtained  24 weeks after the first dose and 16 weeks after the second dose.  Zoster vaccine. One dose is recommended for adults aged 50 years or older unless certain  conditions are present.  Measles, mumps, and rubella (MMR) vaccine. Adults born before [birth year] generally are considered immune to measles and mumps. Adults born in [birth year] or later should have 1 or more doses of MMR vaccine unless there is a contraindication to the vaccine or there is laboratory evidence of immunity to each of the three diseases. A routine second dose of MMR vaccine should be obtained at least 28 days after the first dose for students attending postsecondary schools, health care workers, or international travelers. People who received inactivated measles vaccine or an unknown type of measles vaccine during 1963 1967 should receive 2 doses of MMR vaccine. People who received inactivated mumps vaccine or an unknown type of mumps vaccine before 1979 and are at high risk for mumps infection should consider immunization with 2 doses of MMR vaccine. For females of childbearing age, rubella immunity should be determined. If there is no evidence of immunity, females who are not pregnant should be vaccinated. If there is no evidence of immunity, females who are pregnant should delay immunization until after pregnancy. Unvaccinated health care workers born before [birth year] who lack laboratory evidence of measles, mumps, or rubella immunity or laboratory confirmation of disease should consider measles and mumps immunization with 2 doses of MMR vaccine or rubella immunization with 1 dose of MMR vaccine.  Pneumococcal 13-valent conjugate (PCV13) vaccine. When indicated, a person who is uncertain of her immunization history and has no record of immunization should receive the PCV13 vaccine. An adult aged 42 years or older who has certain medical conditions and has not been previously immunized should receive 1 dose of PCV13 vaccine. This PCV13 should be followed  with a dose of pneumococcal polysaccharide (PPSV23) vaccine. The PPSV23 vaccine dose should be obtained at least 8 weeks after the dose of PCV13 vaccine. An adult aged 4 years or older who has certain medical conditions and previously received 1 or more doses of PPSV23 vaccine should receive 1 dose of PCV13. The PCV13 vaccine dose should be obtained 1 or more years after the last PPSV23 vaccine dose.  Pneumococcal polysaccharide (PPSV23) vaccine. When PCV13 is also indicated, PCV13 should be obtained first. All adults aged 27 years and older should be immunized. An adult younger than age 33 years who has certain medical conditions should be immunized. Any person who resides in a nursing home or long-term care facility should be immunized. An adult smoker should be immunized. People with an immunocompromised condition and certain other conditions should receive both PCV13 and PPSV23 vaccines. People with human immunodeficiency virus (HIV) infection should be immunized as soon as possible after diagnosis. Immunization during chemotherapy or radiation therapy should be avoided. Routine use of PPSV23 vaccine is not recommended for American Indians, [place] Natives, or people younger than 65 years unless there are medical conditions that require PPSV23 vaccine. When indicated, people who have unknown immunization and have no record of immunization should receive PPSV23 vaccine. One-time revaccination 5 years after the first dose of PPSV23 is recommended for people aged 13 64 years who have chronic kidney failure, nephrotic syndrome, asplenia, or immunocompromised conditions. People who received 1 2 doses of PPSV23 before age 66 years should receive another dose of PPSV23 vaccine at age 27 years or later if at least 5 years have passed since the previous dose. Doses of PPSV23 are not needed for people immunized with PPSV23 at or after age 33 years.  Meningococcal vaccine. Adults with asplenia or persistent complement  component deficiencies should receive 2  doses of quadrivalent meningococcal conjugate (MenACWY-D) vaccine. The doses should be obtained at least 2 months apart. Microbiologists working with certain meningococcal bacteria, [place] recruits, people at risk during an outbreak, and people who travel to or live in countries with a high rate of meningitis should be immunized. A first-year college student up through age 49 years who is living in a residence hall should receive a dose if she did not receive a dose on or after her 16th birthday. Adults who have certain high-risk conditions should receive one or more doses of vaccine.  Hepatitis A vaccine. Adults who wish to be protected from this disease, have certain high-risk conditions, work with hepatitis A-infected animals, work in hepatitis A research labs, or travel to or work in countries with a high rate of hepatitis A should be immunized. Adults who were previously unvaccinated and who anticipate close contact with an international adoptee during the first 60 days after arrival in the [place] from a country with a high rate of hepatitis A should be immunized.  Hepatitis B vaccine. Adults who wish to be protected from this disease, have certain high-risk conditions, may be exposed to blood or other infectious body fluids, are household contacts or sex partners of hepatitis B positive people, are clients or workers in certain care facilities, or travel to or work in countries with a high rate of hepatitis B should be immunized.  Haemophilus influenzae type b (Hib) vaccine. A previously unvaccinated person with asplenia or sickle cell disease or having a scheduled splenectomy should receive 1 dose of Hib vaccine. Regardless of previous immunization, a recipient of a hematopoietic stem cell transplant should receive a 3-dose series 6 12 months after her successful transplant. Hib vaccine is not recommended for adults with HIV infection. Preventive  Services / Frequency Ages 24 to 39years  Blood pressure check.** / Every 1 to 2 years.  Lipid and cholesterol check.** / Every 5 years beginning at age 66.  Clinical breast exam.** / Every 3 years for women in their 12s and 24s.  BRCA-related cancer risk assessment.** / For women who have family members with a BRCA-related cancer (breast, ovarian, tubal, or peritoneal cancers).  Pap test.** / Every 2 years from ages 31 through 69. Every 3 years starting at age 64 through age 76 or 89 with a history of 3 consecutive normal Pap tests.  HPV screening.** / Every 3 years from ages 10 through ages 10 to 96 with a history of 3 consecutive normal Pap tests.  Hepatitis C blood test.** / For any individual with known risks for hepatitis C.  Skin self-exam. / Monthly.  Influenza vaccine. / Every year.  Tetanus, diphtheria, and acellular pertussis (Tdap, Td) vaccine.** / Consult your health care provider. Pregnant women should receive 1 dose of Tdap vaccine during each pregnancy. 1 dose of Td every 10 years.  Varicella vaccine.** / Consult your health care provider. Pregnant females who do not have evidence of immunity should receive the first dose after pregnancy.  HPV vaccine. / 3 doses over 6 months, if 90 and younger. The vaccine is not recommended for use in pregnant females. However, pregnancy testing is not needed before receiving a dose.  Measles, mumps, rubella (MMR) vaccine.** / You need at least 1 dose of MMR if you were born in 1957 or later. You may also need a 2nd dose. For females of childbearing age, rubella immunity should be determined. If there is no evidence of immunity, females who are not  pregnant should be vaccinated. If there is no evidence of immunity, females who are pregnant should delay immunization until after pregnancy.  Pneumococcal 13-valent conjugate (PCV13) vaccine.** / Consult your health care provider.  Pneumococcal polysaccharide (PPSV23) vaccine.** / 1 to 2  doses if you smoke cigarettes or if you have certain conditions.  Meningococcal vaccine.** / 1 dose if you are age 88 to 6 years and a Market researcher living in a residence hall, or have one of several medical conditions, you need to get vaccinated against meningococcal disease. You may also need additional booster doses.  Hepatitis A vaccine.** / Consult your health care provider.  Hepatitis B vaccine.** / Consult your health care provider.  Haemophilus influenzae type b (Hib) vaccine.** / Consult your health care provider. Ages 23 to 64years  Blood pressure check.** / Every 1 to 2 years.  Lipid and cholesterol check.** / Every 5 years beginning at age 20 years.  Lung cancer screening. / Every year if you are aged 51 80 years and have a 30-pack-year history of smoking and currently smoke or have quit within the past 15 years. Yearly screening is stopped once you have quit smoking for at least 15 years or develop a health problem that would prevent you from having lung cancer treatment.  Clinical breast exam.** / Every year after age 8 years.  BRCA-related cancer risk assessment.** / For women who have family members with a BRCA-related cancer (breast, ovarian, tubal, or peritoneal cancers).  Mammogram.** / Every year beginning at age 10 years and continuing for as long as you are in good health. Consult with your health care provider.  Pap test.** / Every 3 years starting at age 30 years through age 5 or 61 years with a history of 3 consecutive normal Pap tests.  HPV screening.** / Every 3 years from ages 39 years through ages 72 to 19 years with a history of 3 consecutive normal Pap tests.  Fecal occult blood test (FOBT) of stool. / Every year beginning at age 59 years and continuing until age 27 years. You may not need to do this test if you get a colonoscopy every 10 years.  Flexible sigmoidoscopy or colonoscopy.** / Every 5 years for a flexible sigmoidoscopy or every  10 years for a colonoscopy beginning at age 110 years and continuing until age 63 years.  Hepatitis C blood test.** / For all people born from [birth year] through 1965 and any individual with known risks for hepatitis C.  Skin self-exam. / Monthly.  Influenza vaccine. / Every year.  Tetanus, diphtheria, and acellular pertussis (Tdap/Td) vaccine.** / Consult your health care provider. Pregnant women should receive 1 dose of Tdap vaccine during each pregnancy. 1 dose of Td every 10 years.  Varicella vaccine.** / Consult your health care provider. Pregnant females who do not have evidence of immunity should receive the first dose after pregnancy.  Zoster vaccine.** / 1 dose for adults aged 46 years or older.  Measles, mumps, rubella (MMR) vaccine.** / You need at least 1 dose of MMR if you were born in 1957 or later. You may also need a 2nd dose. For females of childbearing age, rubella immunity should be determined. If there is no evidence of immunity, females who are not pregnant should be vaccinated. If there is no evidence of immunity, females who are pregnant should delay immunization until after pregnancy.  Pneumococcal 13-valent conjugate (PCV13) vaccine.** / Consult your health care provider.  Pneumococcal polysaccharide (PPSV23) vaccine.** / 1  to 2 doses if you smoke cigarettes or if you have certain conditions.  Meningococcal vaccine.** / Consult your health care provider.  Hepatitis A vaccine.** / Consult your health care provider.  Hepatitis B vaccine.** / Consult your health care provider.  Haemophilus influenzae type b (Hib) vaccine.** / Consult your health care provider. Ages 32 years and over  Blood pressure check.** / Every 1 to 2 years.  Lipid and cholesterol check.** / Every 5 years beginning at age 53 years.  Lung cancer screening. / Every year if you are aged 77 80 years and have a 30-pack-year history of smoking and currently smoke or have quit within the past 15 years.  Yearly screening is stopped once you have quit smoking for at least 15 years or develop a health problem that would prevent you from having lung cancer treatment.  Clinical breast exam.** / Every year after age 73 years.  BRCA-related cancer risk assessment.** / For women who have family members with a BRCA-related cancer (breast, ovarian, tubal, or peritoneal cancers).  Mammogram.** / Every year beginning at age 17 years and continuing for as long as you are in good health. Consult with your health care provider.  Pap test.** / Every 3 years starting at age 17 years through age 43 or 42 years with 3 consecutive normal Pap tests. Testing can be stopped between 65 and 70 years with 3 consecutive normal Pap tests and no abnormal Pap or HPV tests in the past 10 years.  HPV screening.** / Every 3 years from ages 69 years through ages 59 or 53 years with a history of 3 consecutive normal Pap tests. Testing can be stopped between 65 and 70 years with 3 consecutive normal Pap tests and no abnormal Pap or HPV tests in the past 10 years.  Fecal occult blood test (FOBT) of stool. / Every year beginning at age 71 years and continuing until age 64 years. You may not need to do this test if you get a colonoscopy every 10 years.  Flexible sigmoidoscopy or colonoscopy.** / Every 5 years for a flexible sigmoidoscopy or every 10 years for a colonoscopy beginning at age 28 years and continuing until age 25 years.  Hepatitis C blood test.** / For all people born from [birth year] through 1965 and any individual with known risks for hepatitis C.  Osteoporosis screening.** / A one-time screening for women ages 72 years and over and women at risk for fractures or osteoporosis.  Skin self-exam. / Monthly.  Influenza vaccine. / Every year.  Tetanus, diphtheria, and acellular pertussis (Tdap/Td) vaccine.** / 1 dose of Td every 10 years.  Varicella vaccine.** / Consult your health care provider.  Zoster vaccine.** / 1  dose for adults aged 45 years or older.  Pneumococcal 13-valent conjugate (PCV13) vaccine.** / Consult your health care provider.  Pneumococcal polysaccharide (PPSV23) vaccine.** / 1 dose for all adults aged 28 years and older.  Meningococcal vaccine.** / Consult your health care provider.  Hepatitis A vaccine.** / Consult your health care provider.  Hepatitis B vaccine.** / Consult your health care provider.  Haemophilus influenzae type b (Hib) vaccine.** / Consult your health care provider. ** Family history and personal history of risk and conditions may change your health care provider's recommendations. Document Released: 02/28/2001 Document Revised: 10/23/2012 Document Reviewed: 05/30/2010 [facility] Patient Information 2014 [place], [place].

## 2014-05-27 ENCOUNTER — Encounter: Payer: 59 | Admitting: Family Medicine

## 2014-06-26 ENCOUNTER — Telehealth: Payer: Self-pay | Admitting: *Deleted

## 2014-06-26 NOTE — Telephone Encounter (Signed)
Unable to reach patient at time of Pre Visit Call.   

## 2014-06-29 ENCOUNTER — Ambulatory Visit (INDEPENDENT_AMBULATORY_CARE_PROVIDER_SITE_OTHER): Payer: Medicare Other | Admitting: Family Medicine

## 2014-06-29 ENCOUNTER — Encounter: Payer: Self-pay | Admitting: Family Medicine

## 2014-06-29 ENCOUNTER — Other Ambulatory Visit (HOSPITAL_COMMUNITY)
Admission: RE | Admit: 2014-06-29 | Discharge: 2014-06-29 | Disposition: A | Discharge status: Home / Self Care | Payer: Medicare Other | Source: Ambulatory Visit | Attending: Family Medicine | Admitting: Family Medicine

## 2014-06-29 VITALS — BP 128/72 | HR 61 | Temp 98.1°F | Ht 65.0 in | Wt 149.0 lb

## 2014-06-29 DIAGNOSIS — Z1151 Encounter for screening for human papillomavirus (HPV): Secondary | ICD-10-CM | POA: Diagnosis present

## 2014-06-29 DIAGNOSIS — Z124 Encounter for screening for malignant neoplasm of cervix: Secondary | ICD-10-CM

## 2014-06-29 DIAGNOSIS — Z1231 Encounter for screening mammogram for malignant neoplasm of breast: Secondary | ICD-10-CM | POA: Diagnosis not present

## 2014-06-29 DIAGNOSIS — M810 Age-related osteoporosis without current pathological fracture: Secondary | ICD-10-CM

## 2014-06-29 DIAGNOSIS — E2839 Other primary ovarian failure: Secondary | ICD-10-CM

## 2014-06-29 DIAGNOSIS — Z23 Encounter for immunization: Secondary | ICD-10-CM

## 2014-06-29 DIAGNOSIS — Z0000 Encounter for general adult medical examination without abnormal findings: Secondary | ICD-10-CM

## 2014-06-29 DIAGNOSIS — R252 Cramp and spasm: Secondary | ICD-10-CM

## 2014-06-29 DIAGNOSIS — Z79899 Other long term (current) drug therapy: Secondary | ICD-10-CM

## 2014-06-29 DIAGNOSIS — K5901 Slow transit constipation: Secondary | ICD-10-CM

## 2014-06-29 DIAGNOSIS — R195 Other fecal abnormalities: Secondary | ICD-10-CM | POA: Diagnosis not present

## 2014-06-29 LAB — CBC WITH DIFFERENTIAL/PLATELET
Basophils Absolute: 0 10*3/uL (ref 0.0–0.1)
Basophils Relative: 0.6 % (ref 0.0–3.0)
Eosinophils Absolute: 0.6 10*3/uL (ref 0.0–0.7)
Eosinophils Relative: 8.7 % — ABNORMAL HIGH (ref 0.0–5.0)
HCT: 40.4 % (ref 36.0–46.0)
Hemoglobin: 13.6 g/dL (ref 12.0–15.0)
Lymphocytes Relative: 26 % (ref 12.0–46.0)
Lymphs Abs: 1.8 10*3/uL (ref 0.7–4.0)
MCHC: 33.6 g/dL (ref 30.0–36.0)
MCV: 89.4 fl (ref 78.0–100.0)
Monocytes Absolute: 0.5 10*3/uL (ref 0.1–1.0)
Monocytes Relative: 6.8 % (ref 3.0–12.0)
Neutro Abs: 4 10*3/uL (ref 1.4–7.7)
Neutrophils Relative %: 57.9 % (ref 43.0–77.0)
Platelets: 212 10*3/uL (ref 150.0–400.0)
RBC: 4.52 Mil/uL (ref 3.87–5.11)
RDW: 13.2 % (ref 11.5–15.5)
WBC: 7 10*3/uL (ref 4.0–10.5)

## 2014-06-29 LAB — HEPATIC FUNCTION PANEL
ALT: 17 U/L (ref 0–35)
AST: 24 U/L (ref 0–37)
Albumin: 4.3 g/dL (ref 3.5–5.2)
Alkaline Phosphatase: 80 U/L (ref 39–117)
Bilirubin, Direct: 0.1 mg/dL (ref 0.0–0.3)
Total Bilirubin: 0.5 mg/dL (ref 0.2–1.2)
Total Protein: 8 g/dL (ref 6.0–8.3)

## 2014-06-29 LAB — T3, FREE: T3, Free: 3.3 pg/mL (ref 2.3–4.2)

## 2014-06-29 LAB — LIPID PANEL
Cholesterol: 179 mg/dL (ref 0–200)
HDL: 46.1 mg/dL (ref >39.00)
LDL Cholesterol: 110 mg/dL — ABNORMAL HIGH (ref 0–99)
NonHDL: 132.9
Total CHOL/HDL Ratio: 4
Triglycerides: 117 mg/dL (ref 0.0–149.0)
VLDL: 23.4 mg/dL (ref 0.0–40.0)

## 2014-06-29 LAB — BASIC METABOLIC PANEL
BUN: 12 mg/dL (ref 6–23)
CO2: 28 mEq/L (ref 19–32)
Calcium: 9.7 mg/dL (ref 8.4–10.5)
Chloride: 102 mEq/L (ref 96–112)
Creatinine, Ser: 0.83 mg/dL (ref 0.40–1.20)
GFR: 72.45 mL/min (ref >60.00)
Glucose, Bld: 90 mg/dL (ref 70–99)
Potassium: 4 mEq/L (ref 3.5–5.1)
Sodium: 136 mEq/L (ref 135–145)

## 2014-06-29 LAB — PHOSPHORUS: Phosphorus: 3.3 mg/dL (ref 2.3–4.6)

## 2014-06-29 LAB — T4, FREE: Free T4: 0.91 ng/dL (ref 0.60–1.60)

## 2014-06-29 LAB — TSH: TSH: 2.08 u[IU]/mL (ref 0.35–4.50)

## 2014-06-29 LAB — MAGNESIUM: Magnesium: 1.9 mg/dL (ref 1.5–2.5)

## 2014-06-29 MED ORDER — LUBIPROSTONE 24 MCG PO CAPS: 24.0000 ug | ORAL_CAPSULE | Freq: Two times a day (BID) | ORAL | Status: DC

## 2014-06-29 MED ORDER — TETANUS-DIPHTH-ACELL PERTUSSIS 5-2-15.5 LF-MCG/0.5 IM SUSP: 0.5000 mL | Freq: Once | INTRAMUSCULAR | Status: DC

## 2014-06-29 NOTE — Progress Notes (Signed)
Subjective:   Charlotte Brooks is a 69 y.o. female who presents for Medicare Annual (Subsequent) preventive examination. She c/o chronic constipation --for years--she has seen GI and tried may things but not any of newer meds.   Review of Systems:   Review of Systems  Constitutional: Negative for activity change, appetite change and fatigue.  HENT: Negative for hearing loss, congestion, tinnitus and ear discharge.   Eyes: Negative for visual disturbance (see optho q1y -- vision corrected to 20/20 with glasses).  Respiratory: Negative for cough, chest tightness and shortness of breath.   Cardiovascular: Negative for chest pain, palpitations and leg swelling.  Gastrointestinal: Negative for abdominal pain, diarrhea,  and abdominal distention. + constipation Genitourinary: Negative for urgency, frequency, decreased urine volume and difficulty urinating.  Musculoskeletal: Negative for back pain, arthralgias and gait problem.  Skin: Negative for color change, pallor and rash.  Neurological: Negative for dizziness, light-headedness, numbness and headaches.  Hematological: Negative for adenopathy. Does not bruise/bleed easily.  Psychiatric/Behavioral: Negative for suicidal ideas, confusion, sleep disturbance, self-injury, dysphoric mood, decreased concentration and agitation.  Pt is able to read and write and can do all ADLs No risk for falling No abuse/ violence in home           Objective:     Vitals: BP 128/72 mmHg  Pulse 61  Temp(Src) 98.1 F (36.7 C) (Oral)  Ht 5\' 5"  (1.651 m)  Wt 149 lb (67.586 kg)  BMI 24.79 kg/m2  SpO2 99% BP 128/72 mmHg  Pulse 61  Temp(Src) 98.1 F (36.7 C) (Oral)  Ht 5\' 5"  (1.651 m)  Wt 149 lb (67.586 kg)  BMI 24.79 kg/m2  SpO2 99% General appearance: alert, cooperative, appears stated age and no distress Head: Normocephalic, without obvious abnormality, atraumatic Eyes: conjunctivae/corneas clear. PERRL, EOM's intact. Fundi benign. Ears:  normal TM's and external ear canals both ears Nose: Nares normal. Septum midline. Mucosa normal. No drainage or sinus tenderness. Throat: lips, mucosa, and tongue normal; teeth and gums normal Neck: no adenopathy, no carotid bruit, no JVD, supple, symmetrical, trachea midline and thyroid not enlarged, symmetric, no tenderness/mass/nodules Back: symmetric, no curvature. ROM normal. No CVA tenderness. Lungs: clear to auscultation bilaterally Breasts: normal appearance, no masses or tenderness Heart: S1, S2 normal Abdomen: soft, non-tender; bowel sounds normal; no masses,  no organomegaly Pelvic: cervix normal in appearance, external genitalia normal, no adnexal masses or tenderness, no cervical motion tenderness, rectovaginal septum normal, uterus normal size, shape, and consistency, vagina normal without discharge and pap done, rectal heme positive brown stool Extremities: extremities normal, atraumatic, no cyanosis or edema Pulses: 2+ and symmetric Skin: Skin color, texture, turgor normal. No rashes or lesions Lymph nodes: Cervical, supraclavicular, and axillary nodes normal. Neurologic: Alert and oriented X 3, normal strength and tone. Normal symmetric reflexes. Normal coordination and gait Psych- no depression, no anxiety  Tobacco History  Smoking status  . Never Smoker   Smokeless tobacco  . Never Used     Counseling given: Not Answered   Past Medical History  Diagnosis Date  . Depression   . Osteoporosis     Gets Reclast   Past Surgical History  Procedure Laterality Date  . Hernia repair  2/08  . Knee surgery     Family History  Problem Relation Age of Onset  . Coronary artery disease    . Stroke Mother   . Hypertension Mother   . Diabetes Brother   . Heart disease Brother   . Diabetes  Maternal Grandmother   . Diabetes Maternal Grandfather    History  Sexual Activity  . Sexual Activity:  . Partners: Male    Outpatient Encounter Prescriptions as of 06/29/2014    Medication Sig  . BIOTIN 5000 PO Take 1 tablet by mouth daily.  . Bisacodyl (LAXATIVE PO) Take by mouth. Swiss Kriss (Herbal Laxative)  . Calcium Carb-Cholecalciferol (CALTRATE 600+D) 600-800 MG-UNIT TABS Take 1 tablet by mouth daily.  . Cholecalciferol (VITAMIN D-3) 1000 UNITS CAPS Take by mouth.  . desonide (DESOWEN) 0.05 % cream Apply 1 application topically daily.  Marland Kitchen MAGNESIUM-ZINC PO Take 1 tablet by mouth daily.  . Misc Natural Products (OSTEO BI-FLEX ADV DOUBLE ST PO) Take 1 tablet by mouth daily.  . vitamin C (ASCORBIC ACID) 500 MG tablet Take 1,000 mg by mouth daily.  . zoledronic acid (RECLAST) 5 MG/100ML SOLN injection Inject 5 mg into the vein once. Every year  . lubiprostone (AMITIZA) 24 MCG capsule Take 1 capsule (24 mcg total) by mouth 2 (two) times daily with a meal.  . Tdap (ADACEL) 05-17-13.5 LF-MCG/0.5 injection Inject 0.5 mLs into the muscle once.   No facility-administered encounter medications on file as of 06/29/2014.    Activities of Daily Living In your present state of health, do you have any difficulty performing the following activities: 06/29/2014 06/29/2014  Hearing? N N  Vision? N N  Difficulty concentrating or making decisions? N N  Walking or climbing stairs? N N  Dressing or bathing? N N  Doing errands, shopping? N N    Patient Care Team: Rosalita Chessman, DO as PCP - General Karoline Caldwell, OD as Referring Physician (Optometry)    Assessment:    CPE: Exercise Activities and Dietary recommendations--- machines, yoga and pool daily    Goals    None     Fall Risk Fall Risk  06/29/2014 06/29/2014 05/21/2013  Falls in the past year? No No No   Depression Screen PHQ 2/9 Scores 06/29/2014 06/29/2014 05/21/2013 05/24/2011  PHQ - 2 Score 0 0 0 0     Cognitive Testing aaox3 nad  Immunization History  Administered Date(s) Administered  . Zoster 05/01/2007   Screening Tests Health Maintenance  Topic Date Due  . TETANUS/TDAP  06/29/1964  . MAMMOGRAM   01/10/2014  . PNA vac Low Risk Adult (1 of 2 - PCV13) 06/29/2015 (Originally 06/30/2010)  . INFLUENZA VACCINE  08/17/2014  . COLONOSCOPY  05/23/2016  . DEXA SCAN  Completed  . ZOSTAVAX  Completed      Plan:   see AVS During the course of the visit the patient was educated and counseled about the following appropriate screening and preventive services:   Vaccines to include Pneumoccal, Influenza, Hepatitis B, Td, Zostavax, HCV  Electrocardiogram  Cardiovascular Disease  Colorectal cancer screening  Bone density screening  Diabetes screening  Glaucoma screening  Mammography/PAP  Nutrition counseling   Patient Instructions (the written plan) was given to the patient.  1. Estrogen deficiency  - DG Bone Density; Future  2. Osteoporosis  - DG Bone Density; Future  3. Encounter for screening mammogram for breast cancer  - MM Digital Screening; Future  4. Preventative health care See avs - Basic metabolic panel - CBC with Differential/Platelet - Hepatic function panel - Lipid panel - TSH - POCT urinalysis dipstick  5. Slow transit constipation  - TSH - T3, free - T4, free - lubiprostone (AMITIZA) 24 MCG capsule; Take 1 capsule (24 mcg total) by mouth 2 (two) times  daily with a meal.  Dispense: 60 capsule; Refill: 5 - Ambulatory referral to Gastroenterology  6. Need for prophylactic vaccination with combined diphtheria-tetanus-pertussis (DTP) vaccine  - Tdap (ADACEL) 05-17-13.5 LF-MCG/0.5 injection; Inject 0.5 mLs into the muscle once.  Dispense: 0.5 mL; Refill: 0  7. Cramp of both lower extremities  - Magnesium; Future - Phosphorus; Future - Magnesium - Phosphorus  8. Routine history and physical examination of adult   9. Screening for malignant neoplasm of cervix  - Cytology - PAP  10. Heme positive stool Probably from constipation - Ambulatory referral to Gastroenterology  Garnet Koyanagi, DO  06/29/2014

## 2014-06-29 NOTE — Progress Notes (Signed)
Pre visit review using our clinic review tool, if applicable. No additional management support is needed unless otherwise documented below in the visit note. 

## 2014-06-29 NOTE — Patient Instructions (Addendum)
Your [facility] D[facility] has been scheduled for July 13, 2104 at 2 pm Address: [address], [place] [address]  [phone]   Hours:   Monday 8AM-5PM  Tuesday 8AM-5PM  Wednesday 8AM-5PM  Thursday 8AM-5PM  Friday 8AM-5PM  Saturday Closed  Sunday Closed    Preventive Care for Adults A healthy lifestyle and preventive care can promote health and wellness. Preventive health guidelines for women include the following key practices.  A routine yearly physical is a good way to check with your health care provider about your health and preventive screening. It is a chance to share any concerns and updates on your health and to receive a thorough exam.  Visit your dentist for a routine exam and preventive care every 6 months. Brush your teeth twice a day and floss once a day. Good oral hygiene prevents tooth decay and gum disease.  The frequency of eye exams is based on your age, health, family medical history, use of contact lenses, and other factors. Follow your health care provider's recommendations for frequency of eye exams.  Eat a healthy diet. Foods like vegetables, fruits, whole grains, low-fat dairy products, and lean protein foods contain the nutrients you need without too many calories. Decrease your intake of foods high in solid fats, added sugars, and salt. Eat the right amount of calories for you.Get information about a proper diet from your health care provider, if necessary.  Regular physical exercise is one of the most important things you can do for your health. Most adults should get at least 150 minutes of moderate-intensity exercise (any activity that increases your heart rate and causes you to sweat) each week. In addition, most adults need muscle-strengthening exercises on 2 or more days a week.  Maintain a healthy weight. The body mass index (BMI) is a screening tool to identify possible weight problems. It provides an estimate of body fat based on height and  weight. Your health care provider can find your BMI and can help you achieve or maintain a healthy weight.For adults 20 years and older:  A BMI below 18.5 is considered underweight.  A BMI of 18.5 to 24.9 is normal.  A BMI of 25 to 29.9 is considered overweight.  A BMI of 30 and above is considered obese.  Maintain normal blood lipids and cholesterol levels by exercising and minimizing your intake of saturated fat. Eat a balanced diet with plenty of fruit and vegetables. Blood tests for lipids and cholesterol should begin at age 54 and be repeated every 5 years. If your lipid or cholesterol levels are high, you are over 50, or you are at high risk for heart disease, you may need your cholesterol levels checked more frequently.Ongoing high lipid and cholesterol levels should be treated with medicines if diet and exercise are not working.  If you smoke, find out from your health care provider how to quit. If you do not use tobacco, do not start.  Lung cancer screening is recommended for adults aged 57-80 years who are at high risk for developing lung cancer because of a history of smoking. A yearly low-dose CT scan of the lungs is recommended for people who have at least a 30-pack-year history of smoking and are a current smoker or have quit within the past 15 years. A pack year of smoking is smoking an average of 1 pack of cigarettes a day for 1 year (for example: 1 pack a day for 30 years or 2 packs a  day for 15 years). Yearly screening should continue until the smoker has stopped smoking for at least 15 years. Yearly screening should be stopped for people who develop a health problem that would prevent them from having lung cancer treatment.  If you are pregnant, do not drink alcohol. If you are breastfeeding, be very cautious about drinking alcohol. If you are not pregnant and choose to drink alcohol, do not have more than 1 drink per day. One drink is considered to be 12 ounces (355 mL) of beer,  5 ounces (148 mL) of wine, or 1.5 ounces (44 mL) of liquor.  Avoid use of street drugs. Do not share needles with anyone. Ask for help if you need support or instructions about stopping the use of drugs.  High blood pressure causes heart disease and increases the risk of stroke. Your blood pressure should be checked at least every 1 to 2 years. Ongoing high blood pressure should be treated with medicines if weight loss and exercise do not work.  If you are 60-33 years old, ask your health care provider if you should take aspirin to prevent strokes.  Diabetes screening involves taking a blood sample to check your fasting blood sugar level. This should be done once every 3 years, after age 75, if you are within normal weight and without risk factors for diabetes. Testing should be considered at a younger age or be carried out more frequently if you are overweight and have at least 1 risk factor for diabetes.  Breast cancer screening is essential preventive care for women. You should practice "breast self-awareness." This means understanding the normal appearance and feel of your breasts and may include breast self-examination. Any changes detected, no matter how small, should be reported to a health care provider. Women in their 34s and 30s should have a clinical breast exam (CBE) by a health care provider as part of a regular health exam every 1 to 3 years. After age 24, women should have a CBE every year. Starting at age 55, women should consider having a mammogram (breast X-ray test) every year. Women who have a family history of breast cancer should talk to their health care provider about genetic screening. Women at a high risk of breast cancer should talk to their health care providers about having an MRI and a mammogram every year.  Breast cancer gene (BRCA)-related cancer risk assessment is recommended for women who have family members with BRCA-related cancers. BRCA-related cancers include breast,  ovarian, tubal, and peritoneal cancers. Having family members with these cancers may be associated with an increased risk for harmful changes (mutations) in the breast cancer genes BRCA1 and BRCA2. Results of the assessment will determine the need for genetic counseling and BRCA1 and BRCA2 testing.  Routine pelvic exams to screen for cancer are no longer recommended for nonpregnant women who are considered low risk for cancer of the pelvic organs (ovaries, uterus, and vagina) and who do not have symptoms. Ask your health care provider if a screening pelvic exam is right for you.  If you have had past treatment for cervical cancer or a condition that could lead to cancer, you need Pap tests and screening for cancer for at least 20 years after your treatment. If Pap tests have been discontinued, your risk factors (such as having a new sexual partner) need to be reassessed to determine if screening should be resumed. Some women have medical problems that increase the chance of getting cervical cancer. In these cases,  your health care provider may recommend more frequent screening and Pap tests.  The HPV test is an additional test that may be used for cervical cancer screening. The HPV test looks for the virus that can cause the cell changes on the cervix. The cells collected during the Pap test can be tested for HPV. The HPV test could be used to screen women aged 28 years and older, and should be used in women of any age who have unclear Pap test results. After the age of 59, women should have HPV testing at the same frequency as a Pap test.  Colorectal cancer can be detected and often prevented. Most routine colorectal cancer screening begins at the age of 92 years and continues through age 38 years. However, your health care provider may recommend screening at an earlier age if you have risk factors for colon cancer. On a yearly basis, your health care provider may provide home test kits to check for hidden  blood in the stool. Use of a small camera at the end of a tube, to directly examine the colon (sigmoidoscopy or colonoscopy), can detect the earliest forms of colorectal cancer. Talk to your health care provider about this at age 20, when routine screening begins. Direct exam of the colon should be repeated every 5-10 years through age 38 years, unless early forms of pre-cancerous polyps or small growths are found.  People who are at an increased risk for hepatitis B should be screened for this virus. You are considered at high risk for hepatitis B if:  You were born in a country where hepatitis B occurs often. Talk with your health care provider about which countries are considered high risk.  Your parents were born in a high-risk country and you have not received a shot to protect against hepatitis B (hepatitis B vaccine).  You have HIV or AIDS.  You use needles to inject street drugs.  You live with, or have sex with, someone who has hepatitis B.  You get hemodialysis treatment.  You take certain medicines for conditions like cancer, organ transplantation, and autoimmune conditions.  Hepatitis C blood testing is recommended for all people born from [birth year] through 1965 and any individual with known risks for hepatitis C.  Practice safe sex. Use condoms and avoid high-risk sexual practices to reduce the spread of sexually transmitted infections (STIs). STIs include gonorrhea, chlamydia, syphilis, trichomonas, herpes, HPV, and human immunodeficiency virus (HIV). Herpes, HIV, and HPV are viral illnesses that have no cure. They can result in disability, cancer, and death.  You should be screened for sexually transmitted illnesses (STIs) including gonorrhea and chlamydia if:  You are sexually active and are younger than 24 years.  You are older than 24 years and your health care provider tells you that you are at risk for this type of infection.  Your sexual activity has changed since you  were last screened and you are at an increased risk for chlamydia or gonorrhea. Ask your health care provider if you are at risk.  If you are at risk of being infected with HIV, it is recommended that you take a prescription medicine daily to prevent HIV infection. This is called preexposure prophylaxis (PrEP). You are considered at risk if:  You are a heterosexual woman, are sexually active, and are at increased risk for HIV infection.  You take drugs by injection.  You are sexually active with a partner who has HIV.  Talk with your health care provider about  whether you are at high risk of being infected with HIV. If you choose to begin PrEP, you should first be tested for HIV. You should then be tested every 3 months for as long as you are taking PrEP.  Osteoporosis is a disease in which the bones lose minerals and strength with aging. This can result in serious bone fractures or breaks. The risk of osteoporosis can be identified using a bone density scan. Women ages 3 years and over and women at risk for fractures or osteoporosis should discuss screening with their health care providers. Ask your health care provider whether you should take a calcium supplement or vitamin D to reduce the rate of osteoporosis.  Menopause can be associated with physical symptoms and risks. Hormone replacement therapy is available to decrease symptoms and risks. You should talk to your health care provider about whether hormone replacement therapy is right for you.  Use sunscreen. Apply sunscreen liberally and repeatedly throughout the day. You should seek shade when your shadow is shorter than you. Protect yourself by wearing long sleeves, pants, a wide-brimmed hat, and sunglasses year round, whenever you are outdoors.  Once a month, do a whole body skin exam, using a mirror to look at the skin on your back. Tell your health care provider of new moles, moles that have irregular borders, moles that are larger  than a pencil eraser, or moles that have changed in shape or color.  Stay current with required vaccines (immunizations).  Influenza vaccine. All adults should be immunized every year.  Tetanus, diphtheria, and acellular pertussis (Td, Tdap) vaccine. Pregnant women should receive 1 dose of Tdap vaccine during each pregnancy. The dose should be obtained regardless of the length of time since the last dose. Immunization is preferred during the 27th-36th week of gestation. An adult who has not previously received Tdap or who does not know her vaccine status should receive 1 dose of Tdap. This initial dose should be followed by tetanus and diphtheria toxoids (Td) booster doses every 10 years. Adults with an unknown or incomplete history of completing a 3-dose immunization series with Td-containing vaccines should begin or complete a primary immunization series including a Tdap dose. Adults should receive a Td booster every 10 years.  Varicella vaccine. An adult without evidence of immunity to varicella should receive 2 doses or a second dose if she has previously received 1 dose. Pregnant females who do not have evidence of immunity should receive the first dose after pregnancy. This first dose should be obtained before leaving the health care facility. The second dose should be obtained 4-8 weeks after the first dose.  Human papillomavirus (HPV) vaccine. Females aged 13-26 years who have not received the vaccine previously should obtain the 3-dose series. The vaccine is not recommended for use in pregnant females. However, pregnancy testing is not needed before receiving a dose. If a female is found to be pregnant after receiving a dose, no treatment is needed. In that case, the remaining doses should be delayed until after the pregnancy. Immunization is recommended for any person with an immunocompromised condition through the age of 68 years if she did not get any or all doses earlier. During the 3-dose  series, the second dose should be obtained 4-8 weeks after the first dose. The third dose should be obtained 24 weeks after the first dose and 16 weeks after the second dose.  Zoster vaccine. One dose is recommended for adults aged 64 years or older unless certain  conditions are present.  Measles, mumps, and rubella (MMR) vaccine. Adults born before [birth year] generally are considered immune to measles and mumps. Adults born in [birth year] or later should have 1 or more doses of MMR vaccine unless there is a contraindication to the vaccine or there is laboratory evidence of immunity to each of the three diseases. A routine second dose of MMR vaccine should be obtained at least 28 days after the first dose for students attending postsecondary schools, health care workers, or international travelers. People who received inactivated measles vaccine or an unknown type of measles vaccine during 1963-1967 should receive 2 doses of MMR vaccine. People who received inactivated mumps vaccine or an unknown type of mumps vaccine before 1979 and are at high risk for mumps infection should consider immunization with 2 doses of MMR vaccine. For females of childbearing age, rubella immunity should be determined. If there is no evidence of immunity, females who are not pregnant should be vaccinated. If there is no evidence of immunity, females who are pregnant should delay immunization until after pregnancy. Unvaccinated health care workers born before [birth year] who lack laboratory evidence of measles, mumps, or rubella immunity or laboratory confirmation of disease should consider measles and mumps immunization with 2 doses of MMR vaccine or rubella immunization with 1 dose of MMR vaccine.  Pneumococcal 13-valent conjugate (PCV13) vaccine. When indicated, a person who is uncertain of her immunization history and has no record of immunization should receive the PCV13 vaccine. An adult aged 44 years or older who has certain medical conditions  and has not been previously immunized should receive 1 dose of PCV13 vaccine. This PCV13 should be followed with a dose of pneumococcal polysaccharide (PPSV23) vaccine. The PPSV23 vaccine dose should be obtained at least 8 weeks after the dose of PCV13 vaccine. An adult aged 41 years or older who has certain medical conditions and previously received 1 or more doses of PPSV23 vaccine should receive 1 dose of PCV13. The PCV13 vaccine dose should be obtained 1 or more years after the last PPSV23 vaccine dose.  Pneumococcal polysaccharide (PPSV23) vaccine. When PCV13 is also indicated, PCV13 should be obtained first. All adults aged 1 years and older should be immunized. An adult younger than age 46 years who has certain medical conditions should be immunized. Any person who resides in a nursing home or long-term care facility should be immunized. An adult smoker should be immunized. People with an immunocompromised condition and certain other conditions should receive both PCV13 and PPSV23 vaccines. People with human immunodeficiency virus (HIV) infection should be immunized as soon as possible after diagnosis. Immunization during chemotherapy or radiation therapy should be avoided. Routine use of PPSV23 vaccine is not recommended for American Indians, [place] Natives, or people younger than 65 years unless there are medical conditions that require PPSV23 vaccine. When indicated, people who have unknown immunization and have no record of immunization should receive PPSV23 vaccine. One-time revaccination 5 years after the first dose of PPSV23 is recommended for people aged 19-64 years who have chronic kidney failure, nephrotic syndrome, asplenia, or immunocompromised conditions. People who received 1-2 doses of PPSV23 before age 56 years should receive another dose of PPSV23 vaccine at age 29 years or later if at least 5 years have passed since the previous dose. Doses of PPSV23 are not needed for people immunized  with PPSV23 at or after age 16 years.  Meningococcal vaccine. Adults with asplenia or persistent complement component deficiencies should receive 2 doses of quadrivalent  meningococcal conjugate (MenACWY-D) vaccine. The doses should be obtained at least 2 months apart. Microbiologists working with certain meningococcal bacteria, [employer], people at risk during an outbreak, and people who travel to or live in countries with a high rate of meningitis should be immunized. A first-year college student up through age 91 years who is living in a residence hall should receive a dose if she did not receive a dose on or after her 16th birthday. Adults who have certain high-risk conditions should receive one or more doses of vaccine.  Hepatitis A vaccine. Adults who wish to be protected from this disease, have certain high-risk conditions, work with hepatitis A-infected animals, work in hepatitis A research labs, or travel to or work in countries with a high rate of hepatitis A should be immunized. Adults who were previously unvaccinated and who anticipate close contact with an international adoptee during the first 60 days after arrival in the [place] from a country with a high rate of hepatitis A should be immunized.  Hepatitis B vaccine. Adults who wish to be protected from this disease, have certain high-risk conditions, may be exposed to blood or other infectious body fluids, are household contacts or sex partners of hepatitis B positive people, are clients or workers in certain care facilities, or travel to or work in countries with a high rate of hepatitis B should be immunized.  Haemophilus influenzae type b (Hib) vaccine. A previously unvaccinated person with asplenia or sickle cell disease or having a scheduled splenectomy should receive 1 dose of Hib vaccine. Regardless of previous immunization, a recipient of a hematopoietic stem cell transplant should receive a 3-dose series 6-12 months  after her successful transplant. Hib vaccine is not recommended for adults with HIV infection. Preventive Services / Frequency Ages 63 to 85 years  Blood pressure check.** / Every 1 to 2 years.  Lipid and cholesterol check.** / Every 5 years beginning at age 57.  Clinical breast exam.** / Every 3 years for women in their 43s and 36s.  BRCA-related cancer risk assessment.** / For women who have family members with a BRCA-related cancer (breast, ovarian, tubal, or peritoneal cancers).  Pap test.** / Every 2 years from ages 14 through 31. Every 3 years starting at age 20 through age 109 or 18 with a history of 3 consecutive normal Pap tests.  HPV screening.** / Every 3 years from ages 26 through ages 39 to 52 with a history of 3 consecutive normal Pap tests.  Hepatitis C blood test.** / For any individual with known risks for hepatitis C.  Skin self-exam. / Monthly.  Influenza vaccine. / Every year.  Tetanus, diphtheria, and acellular pertussis (Tdap, Td) vaccine.** / Consult your health care provider. Pregnant women should receive 1 dose of Tdap vaccine during each pregnancy. 1 dose of Td every 10 years.  Varicella vaccine.** / Consult your health care provider. Pregnant females who do not have evidence of immunity should receive the first dose after pregnancy.  HPV vaccine. / 3 doses over 6 months, if 50 and younger. The vaccine is not recommended for use in pregnant females. However, pregnancy testing is not needed before receiving a dose.  Measles, mumps, rubella (MMR) vaccine.** / You need at least 1 dose of MMR if you were born in 1957 or later. You may also need a 2nd dose. For females of childbearing age, rubella immunity should be determined. If there is no evidence of immunity, females who are not pregnant should be  vaccinated. If there is no evidence of immunity, females who are pregnant should delay immunization until after pregnancy.  Pneumococcal 13-valent conjugate (PCV13)  vaccine.** / Consult your health care provider.  Pneumococcal polysaccharide (PPSV23) vaccine.** / 1 to 2 doses if you smoke cigarettes or if you have certain conditions.  Meningococcal vaccine.** / 1 dose if you are age 69 to 76 years and a Market researcher living in a residence hall, or have one of several medical conditions, you need to get vaccinated against meningococcal disease. You may also need additional booster doses.  Hepatitis A vaccine.** / Consult your health care provider.  Hepatitis B vaccine.** / Consult your health care provider.  Haemophilus influenzae type b (Hib) vaccine.** / Consult your health care provider. Ages 3 to 58 years  Blood pressure check.** / Every 1 to 2 years.  Lipid and cholesterol check.** / Every 5 years beginning at age 1 years.  Lung cancer screening. / Every year if you are aged 69-80 years and have a 30-pack-year history of smoking and currently smoke or have quit within the past 15 years. Yearly screening is stopped once you have quit smoking for at least 15 years or develop a health problem that would prevent you from having lung cancer treatment.  Clinical breast exam.** / Every year after age 53 years.  BRCA-related cancer risk assessment.** / For women who have family members with a BRCA-related cancer (breast, ovarian, tubal, or peritoneal cancers).  Mammogram.** / Every year beginning at age 44 years and continuing for as long as you are in good health. Consult with your health care provider.  Pap test.** / Every 3 years starting at age 24 years through age 57 or 71 years with a history of 3 consecutive normal Pap tests.  HPV screening.** / Every 3 years from ages 55 years through ages 39 to 7 years with a history of 3 consecutive normal Pap tests.  Fecal occult blood test (FOBT) of stool. / Every year beginning at age 57 years and continuing until age 21 years. You may not need to do this test if you get a colonoscopy every  10 years.  Flexible sigmoidoscopy or colonoscopy.** / Every 5 years for a flexible sigmoidoscopy or every 10 years for a colonoscopy beginning at age 55 years and continuing until age 41 years.  Hepatitis C blood test.** / For all people born from [birth year] through 1965 and any individual with known risks for hepatitis C.  Skin self-exam. / Monthly.  Influenza vaccine. / Every year.  Tetanus, diphtheria, and acellular pertussis (Tdap/Td) vaccine.** / Consult your health care provider. Pregnant women should receive 1 dose of Tdap vaccine during each pregnancy. 1 dose of Td every 10 years.  Varicella vaccine.** / Consult your health care provider. Pregnant females who do not have evidence of immunity should receive the first dose after pregnancy.  Zoster vaccine.** / 1 dose for adults aged 88 years or older.  Measles, mumps, rubella (MMR) vaccine.** / You need at least 1 dose of MMR if you were born in 1957 or later. You may also need a 2nd dose. For females of childbearing age, rubella immunity should be determined. If there is no evidence of immunity, females who are not pregnant should be vaccinated. If there is no evidence of immunity, females who are pregnant should delay immunization until after pregnancy.  Pneumococcal 13-valent conjugate (PCV13) vaccine.** / Consult your health care provider.  Pneumococcal polysaccharide (PPSV23) vaccine.** / 1 to 2 doses  if you smoke cigarettes or if you have certain conditions.  Meningococcal vaccine.** / Consult your health care provider.  Hepatitis A vaccine.** / Consult your health care provider.  Hepatitis B vaccine.** / Consult your health care provider.  Haemophilus influenzae type b (Hib) vaccine.** / Consult your health care provider. Ages 62 years and over  Blood pressure check.** / Every 1 to 2 years.  Lipid and cholesterol check.** / Every 5 years beginning at age 60 years.  Lung cancer screening. / Every year if you are aged 18-80  years and have a 30-pack-year history of smoking and currently smoke or have quit within the past 15 years. Yearly screening is stopped once you have quit smoking for at least 15 years or develop a health problem that would prevent you from having lung cancer treatment.  Clinical breast exam.** / Every year after age 85 years.  BRCA-related cancer risk assessment.** / For women who have family members with a BRCA-related cancer (breast, ovarian, tubal, or peritoneal cancers).  Mammogram.** / Every year beginning at age 56 years and continuing for as long as you are in good health. Consult with your health care provider.  Pap test.** / Every 3 years starting at age 82 years through age 67 or 36 years with 3 consecutive normal Pap tests. Testing can be stopped between 65 and 70 years with 3 consecutive normal Pap tests and no abnormal Pap or HPV tests in the past 10 years.  HPV screening.** / Every 3 years from ages 65 years through ages 59 or 46 years with a history of 3 consecutive normal Pap tests. Testing can be stopped between 65 and 70 years with 3 consecutive normal Pap tests and no abnormal Pap or HPV tests in the past 10 years.  Fecal occult blood test (FOBT) of stool. / Every year beginning at age 78 years and continuing until age 78 years. You may not need to do this test if you get a colonoscopy every 10 years.  Flexible sigmoidoscopy or colonoscopy.** / Every 5 years for a flexible sigmoidoscopy or every 10 years for a colonoscopy beginning at age 83 years and continuing until age 45 years.  Hepatitis C blood test.** / For all people born from [birth year] through 1965 and any individual with known risks for hepatitis C.  Osteoporosis screening.** / A one-time screening for women ages 66 years and over and women at risk for fractures or osteoporosis.  Skin self-exam. / Monthly.  Influenza vaccine. / Every year.  Tetanus, diphtheria, and acellular pertussis (Tdap/Td) vaccine.** / 1 dose of  Td every 10 years.  Varicella vaccine.** / Consult your health care provider.  Zoster vaccine.** / 1 dose for adults aged 78 years or older.  Pneumococcal 13-valent conjugate (PCV13) vaccine.** / Consult your health care provider.  Pneumococcal polysaccharide (PPSV23) vaccine.** / 1 dose for all adults aged 38 years and older.  Meningococcal vaccine.** / Consult your health care provider.  Hepatitis A vaccine.** / Consult your health care provider.  Hepatitis B vaccine.** / Consult your health care provider.  Haemophilus influenzae type b (Hib) vaccine.** / Consult your health care provider. ** Family history and personal history of risk and conditions may change your health care provider's recommendations. Document Released: 02/28/2001 Document Revised: 05/19/2013 Document Reviewed: 05/30/2010 [facility] Patient Information 2015 [place], [place]. This information is not intended to replace advice given to you by your health care provider. Make sure you discuss any questions you have with your health care provider.

## 2014-06-30 LAB — CYTOLOGY - PAP

## 2014-07-01 ENCOUNTER — Encounter: Payer: Self-pay | Admitting: General Practice

## 2014-07-13 ENCOUNTER — Telehealth: Payer: Self-pay | Admitting: Internal Medicine

## 2014-07-13 NOTE — Telephone Encounter (Signed)
Patient scheduled tomorrow at 9:45 AM.

## 2014-07-14 ENCOUNTER — Encounter: Payer: Self-pay | Admitting: Internal Medicine

## 2014-07-14 ENCOUNTER — Ambulatory Visit (INDEPENDENT_AMBULATORY_CARE_PROVIDER_SITE_OTHER): Payer: Medicare Other | Admitting: Internal Medicine

## 2014-07-14 VITALS — BP 118/66 | HR 80 | Ht 65.0 in | Wt 150.4 lb

## 2014-07-14 DIAGNOSIS — K648 Other hemorrhoids: Secondary | ICD-10-CM

## 2014-07-14 DIAGNOSIS — R195 Other fecal abnormalities: Secondary | ICD-10-CM

## 2014-07-14 MED ORDER — HYDROCORTISONE ACETATE 25 MG RE SUPP: 25.0000 mg | Freq: Every day | RECTAL | Status: DC

## 2014-07-14 MED ORDER — POLYETHYLENE GLYCOL 3350 17 GM/SCOOP PO POWD: ORAL | Status: DC

## 2014-07-14 MED ORDER — LINACLOTIDE 290 MCG PO CAPS: 290.0000 ug | ORAL_CAPSULE | Freq: Every day | ORAL | Status: DC

## 2014-07-14 NOTE — Patient Instructions (Addendum)
We have sent medications to your pharmacy for you to pick up at your convenience.   Follow up in 3 months.  DR Etter Sjogren

## 2014-07-14 NOTE — Progress Notes (Signed)
Charlotte Brooks 12/02/1945 [id number]  Note: This dictation was prepared with Dragon digital system. Any transcriptional errors that result from this procedure are unintentional.   History of Present Illness: This is a 69 year old white female presents with  low volume rectal bleeding and heme positive stool on exam by Dr.Lowne. We have seen her in the past for chronic constipation. 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 Her last colonoscopy in July 2008 was a very difficult exam because of  redundant  colon. She had a hyperplastic polyp removed. Prior colonoscopy in 2002 was normal. She is currently on a regimen all herbal lax a day 4 tablets daily as well as stool softener. Amitiza 24 g twice a day for past several weeks without any improvement. She has been physically active working out 6 days a week. She is on high fiber diet.     Past Medical History  Diagnosis Date  . Depression   . Osteoporosis     Gets Reclast    Past Surgical History  Procedure Laterality Date  . Hernia repair  2/08  . Knee surgery      Allergies  Allergen Reactions  . Codeine   . Penicillins   . Sulfonamide Derivatives     Family history and social history have been reviewed.  Review of Systems: Low abdominal discomfort. Difficult evacuation. Denies nausea vomiting weight loss  The remainder of the 10 point ROS is negative except as outlined in the H&P  Physical Exam: General Appearance Well developed, in no distress Eyes  Non icteric  HEENT  Non traumatic, normocephalic  Mouth No lesion, tongue papillated, no cheilosis Neck Supple without adenopathy, thyroid not enlarged, no carotid bruits, no JVD Lungs Clear to auscultation bilaterally COR Normal S1, normal S2, regular rhythm, no murmur, quiet precordium Abdomen Soft, nontender, normoactive bowel sounds. No tympany or distention. Liver edge at costal margin   Rectal  and anoscopic exam reveals external  hemorrhoidal tags. Normal rectal sphincter tone. At least 3 internal hemorrhoids which are  erythematous and hyperemic. There was no bleeding. Stool is Hemoccult-negative.  Extremities  No pedal edema Skin No lesions Neurological Alert and oriented x 3 Psychological Normal mood and affect  Assessment and Plan:   69 year old white female with symptomatic first grade internal hemorrhoids , aggravated by chronic constipation and difficult evacuation start hydrocortisone suppository 25 mg to use every night for 10 nights one refill   history of colonic inertia causing chronic constipation. Documented on colonoscopy in 2008. Recall colonoscopy July 2018. Occult negative on my exam today. We will start Linzess 290 g daily in place of Amitiza which she working. She will also start MiraLAX 17 g every morning. She will continue on her daily work out regimen and high-fiber diet. I will recheck her in 3 months     Delfin Edis 07/14/2014

## 2014-07-15 ENCOUNTER — Telehealth: Payer: Self-pay

## 2014-07-15 NOTE — Telephone Encounter (Signed)
Relation to pt: self  Call back number: [phone]   Reason for call:  Pt returning your call regarding message below.

## 2014-07-15 NOTE — Telephone Encounter (Signed)
Reclast Benefits verified. [facility] plan) According to [facility] the plan is effective as of 08/16/13 J-code: J3489, procedure 96365 Dx: M81.0, outpatient procedure has 0 co-pay after deductible. No Prior Authorization , No referral, The plan has a $200 deductible and out of pocket max of $2200. ) has been applied to the deductible. The Reclast infusion will be filled under the Medicare part B- no co-payment after deductible. Message left for the patient to call the office.    KP

## 2014-07-21 NOTE — Telephone Encounter (Signed)
Patient states that she would like to review the Bone Density results first to determine if Reclast is helping. Open for any other suggestion other than this avenue.  Please advise.

## 2014-07-23 NOTE — Telephone Encounter (Signed)
I called [facility] and they advised that they would send over the BMD results.     KP

## 2014-07-23 NOTE — Telephone Encounter (Signed)
I don't think we have them yet.

## 2014-08-06 NOTE — Telephone Encounter (Signed)
I apologize there was a phrase used in the result that made me think they had gotten a new machine so the results could not be compared.  If it was the same machine then, yes it was worse and we can see if your insurance will let you try prolia or another option is forteo which is a shot you can give yourself at home.  It is actually a very little needle that you bearly feel.

## 2014-08-06 NOTE — Telephone Encounter (Signed)
I spoke with the patient and she would like to try Prolia.Benefits verified through the portal.    KP

## 2014-08-06 NOTE — Telephone Encounter (Signed)
BMD: Per provider note comparison can't really be done because 2 different machines. Continue Reclast.  I made the patient and she said that the above statement was untrue, she said the BMD was done in the same facility and Per the technician the spine was worst. The patient wants to know if you recommend another treatment other than Reclast, she said she does not want to take it if it is not working.        KP

## 2014-08-11 ENCOUNTER — Telehealth: Payer: Self-pay | Admitting: Family Medicine

## 2014-08-11 NOTE — Telephone Encounter (Signed)
To MD to advise.      KP 

## 2014-08-11 NOTE — Telephone Encounter (Signed)
Relation to NM:MHWK  Call back number: [phone]   Reason for call:   Patient researched prolia and does not like side effects therefore does not want to go ahead with injection.

## 2014-08-12 ENCOUNTER — Other Ambulatory Visit: Payer: Self-pay | Admitting: *Deleted

## 2014-08-12 MED ORDER — LINACLOTIDE 290 MCG PO CAPS: 290.0000 ug | ORAL_CAPSULE | Freq: Every day | ORAL | Status: DC

## 2014-08-12 MED ORDER — POLYETHYLENE GLYCOL 3350 17 GM/SCOOP PO POWD: ORAL | Status: DC

## 2014-08-13 ENCOUNTER — Encounter: Payer: Self-pay | Admitting: Family Medicine

## 2014-08-13 NOTE — Telephone Encounter (Signed)
We have had a lot of people on prolia with no problems.  The other option is forteo otherwise calcium and vita D and weight bearing exercise and we will repeat next year

## 2014-08-14 NOTE — Telephone Encounter (Signed)
Spoke with patient and she verbalized understanding, she said she would call back and let me know.    KP

## 2014-08-14 NOTE — Telephone Encounter (Signed)
Patient returned your call.

## 2014-08-14 NOTE — Telephone Encounter (Signed)
MSG left to call the office      KP 

## 2014-10-23 ENCOUNTER — Ambulatory Visit: Payer: Medicare Other | Admitting: Gastroenterology

## 2014-11-09 ENCOUNTER — Ambulatory Visit (INDEPENDENT_AMBULATORY_CARE_PROVIDER_SITE_OTHER): Payer: Medicare Other | Admitting: Gastroenterology

## 2014-11-09 ENCOUNTER — Encounter: Payer: Self-pay | Admitting: Gastroenterology

## 2014-11-09 VITALS — BP 104/70 | HR 88 | Ht 64.5 in | Wt 152.1 lb

## 2014-11-09 DIAGNOSIS — K5901 Slow transit constipation: Secondary | ICD-10-CM

## 2014-11-09 NOTE — Patient Instructions (Signed)
Recall letter for your colonoscopy will be mailed out when you are due Follow up as needed

## 2014-11-09 NOTE — Progress Notes (Signed)
Charlotte Brooks    [id number]    [birth year]  Primary Care Physician:Yvonne Etter Sjogren, DO  Referring Physician: Rosalita Chessman, DO [address] [place], [place] [address]  Chief complaint:  Constipation  HPI:  69 year old female here for follow-up of chronic constipation. Patient reports significant improvement with Linzess  290 g daily, she is having bowel movements almost daily and does take occasional over-the-counter herbal laxative in addition to Linzess. No longer having any bright red blood per rectum. Her weight is stable. Denies any nausea vomiting abdominal pain or bloating.   Outpatient Encounter Prescriptions as of 11/09/2014  Medication Sig  . BIOTIN 5000 PO Take 1 tablet by mouth daily.  . Bisacodyl (LAXATIVE PO) Take by mouth. Swiss Kriss (Herbal Laxative)  . Cholecalciferol (VITAMIN D-3) 1000 UNITS CAPS Take by mouth.  . desonide (DESOWEN) 0.05 % cream Apply 1 application topically daily.  Marland Kitchen docusate sodium (COLACE) 100 MG capsule Take 100 mg by mouth daily.  . hydrocortisone (ANUSOL-HC) 25 MG suppository Place 1 suppository (25 mg total) rectally at bedtime.  . Linaclotide (LINZESS) 290 MCG CAPS capsule Take 1 capsule (290 mcg total) by mouth at bedtime.  Marland Kitchen MAGNESIUM-ZINC PO Take 1 tablet by mouth daily.  . Misc Natural Products (OSTEO BI-FLEX ADV DOUBLE ST PO) Take 1 tablet by mouth daily.  . polyethylene glycol powder (GLYCOLAX/MIRALAX) powder Take 17 grams dissolved in liquid every morning.  . Tdap (ADACEL) 05-17-13.5 LF-MCG/0.5 injection Inject 0.5 mLs into the muscle once.  . vitamin C (ASCORBIC ACID) 500 MG tablet Take 1,000 mg by mouth daily.  . [DISCONTINUED] zoledronic acid (RECLAST) 5 MG/100ML SOLN injection Inject 5 mg into the vein once. Every year   No facility-administered encounter medications on file as of 11/09/2014.    Allergies as of 11/09/2014 - Review Complete 11/09/2014  Allergen Reaction Noted  . Codeine  07/22/2007   . Penicillins  07/22/2007  . Sulfonamide derivatives  07/22/2007    Past Medical History  Diagnosis Date  . Depression   . Osteoporosis     Gets Reclast    Past Surgical History  Procedure Laterality Date  . Hernia repair  2/08  . Knee surgery      Family History  Problem Relation Age of Onset  . Coronary artery disease    . Stroke Mother   . Hypertension Mother   . Diabetes Brother   . Heart disease Brother   . Diabetes Maternal Grandmother   . Diabetes Maternal Grandfather     Social History   Social History  . Marital Status: Married    Spouse Name: N/A  . Number of Children: N/A  . Years of Education: N/A   Occupational History  . Not on file.   Social History Main Topics  . Smoking status: Never Smoker   . Smokeless tobacco: Never Used  . Alcohol Use: Yes     Comment: Occ  . Drug Use: No  . Sexual Activity:    Partners: Male   Other Topics Concern  . Not on file   Social History Narrative   Exercise-- water aerobics, yoga, machines      Review of systems: Review of Systems  Constitutional: Negative for fever and chills.  HENT: Negative.   Eyes: Negative for blurred vision.  Respiratory: Negative for cough, shortness of breath and wheezing.   Cardiovascular: Negative for chest pain and palpitations.  Gastrointestinal: as per HPI  Genitourinary: Negative for dysuria, urgency, frequency and hematuria.  Musculoskeletal: Negative for myalgias, back pain and joint pain.  Skin: Negative for itching and rash.  Neurological: Negative for dizziness, tremors, focal weakness, seizures and loss of consciousness.  Endo/Heme/Allergies: Negative for environmental allergies. Positive for osteoporosis Psychiatric/Behavioral: Negative for depression, suicidal ideas and hallucinations.  All other systems reviewed and are negative.   Physical Exam: Filed Vitals:   11/09/14 0944  BP: 104/70  Pulse: 88   Gen:      No acute distress HEENT:  EOMI, sclera  anicteric Neck:     No masses; no thyromegaly Lungs:    Clear to auscultation bilaterally; normal respiratory effort CV:         Regular rate and rhythm; no murmurs Abd:      + bowel sounds; soft, non-tender; no palpable masses, no distension Ext:    No edema; adequate peripheral perfusion Skin:      Warm and dry; no rash Neuro: alert and oriented x 3 Psych: normal mood and affect  Data Reviewed:  Last colonoscopy in July 2008 was a very difficult exam because of redundant colon per Dr Nichola Sizer note. She had a hyperplastic polyp removed.  Prior colonoscopy in 2002 was normal.   Assessment and Plan/Recommendations: 17 yr F with h/o chronic constipation thought to be secondary to pelvic floor dysfunction and hypotonic colon with colonic inertia. She has had significant improvement in constipation with Linzess, is having almost daily bowel movements with no significant complaints. Continue Linzess  290 g daily.  She is due for recall colonoscopy in July 2018 for colorectal cancer screening. Return as needed  K. Denzil Magnuson , MD [phone] Mon-Fri 8a-5p [phone] after 5p, weekends, holidays

## 2014-11-25 ENCOUNTER — Telehealth: Payer: Self-pay | Admitting: *Deleted

## 2014-11-25 ENCOUNTER — Other Ambulatory Visit: Payer: Self-pay | Admitting: *Deleted

## 2014-11-25 MED ORDER — POLYETHYLENE GLYCOL 3350 17 GM/SCOOP PO POWD: ORAL | Status: DC

## 2014-11-25 MED ORDER — LINACLOTIDE 290 MCG PO CAPS: 290.0000 ug | ORAL_CAPSULE | Freq: Every day | ORAL | Status: DC

## 2014-11-25 NOTE — Telephone Encounter (Signed)
Called the patient to advise we got 2 refill request from [facility] for Generic Miralax and Linzess 290 mcg.  She was just seen by Dr. Silverio Decamp on 11-09-2014 . In her office note she advised the patient to continue the Linzess and the Miralax.  I sent the refills to [facility].

## 2015-07-12 ENCOUNTER — Telehealth: Payer: Self-pay

## 2015-07-12 NOTE — Telephone Encounter (Signed)
Patient is on the list for [employer] 2017 and may be a good candidate for an AWV in 2017. Please let me know if/when appt is scheduled.   

## 2015-07-13 NOTE — Telephone Encounter (Signed)
Tiffany, please schedule patient on my schedule for a subsequent AWV.

## 2015-07-16 NOTE — Telephone Encounter (Signed)
LVM advising patient of message below °

## 2015-08-02 ENCOUNTER — Emergency Department (HOSPITAL_COMMUNITY): Payer: Medicare Other

## 2015-08-02 ENCOUNTER — Emergency Department (HOSPITAL_COMMUNITY)
Admission: EM | Admit: 2015-08-02 | Discharge: 2015-08-02 | Disposition: A | Discharge status: Home / Self Care | Payer: Medicare Other | Attending: Emergency Medicine | Admitting: Emergency Medicine

## 2015-08-02 ENCOUNTER — Encounter (HOSPITAL_COMMUNITY): Payer: Self-pay | Admitting: Oncology

## 2015-08-02 DIAGNOSIS — Y929 Unspecified place or not applicable: Secondary | ICD-10-CM | POA: Diagnosis not present

## 2015-08-02 DIAGNOSIS — Y939 Activity, unspecified: Secondary | ICD-10-CM | POA: Insufficient documentation

## 2015-08-02 DIAGNOSIS — M810 Age-related osteoporosis without current pathological fracture: Secondary | ICD-10-CM | POA: Insufficient documentation

## 2015-08-02 DIAGNOSIS — S83004A Unspecified dislocation of right patella, initial encounter: Secondary | ICD-10-CM | POA: Diagnosis not present

## 2015-08-02 DIAGNOSIS — Y999 Unspecified external cause status: Secondary | ICD-10-CM | POA: Diagnosis not present

## 2015-08-02 DIAGNOSIS — W1800XA Striking against unspecified object with subsequent fall, initial encounter: Secondary | ICD-10-CM | POA: Insufficient documentation

## 2015-08-02 DIAGNOSIS — Z791 Long term (current) use of non-steroidal anti-inflammatories (NSAID): Secondary | ICD-10-CM | POA: Insufficient documentation

## 2015-08-02 DIAGNOSIS — S0990XA Unspecified injury of head, initial encounter: Secondary | ICD-10-CM | POA: Diagnosis present

## 2015-08-02 DIAGNOSIS — F329 Major depressive disorder, single episode, unspecified: Secondary | ICD-10-CM | POA: Insufficient documentation

## 2015-08-02 DIAGNOSIS — Z79899 Other long term (current) drug therapy: Secondary | ICD-10-CM | POA: Insufficient documentation

## 2015-08-02 DIAGNOSIS — S060X0A Concussion without loss of consciousness, initial encounter: Secondary | ICD-10-CM

## 2015-08-02 LAB — I-STAT TROPONIN, ED: Troponin i, poc: 0 ng/mL (ref 0.00–0.08)

## 2015-08-02 LAB — URINE MICROSCOPIC-ADD ON

## 2015-08-02 LAB — URINALYSIS, ROUTINE W REFLEX MICROSCOPIC
Bilirubin Urine: NEGATIVE
Glucose, UA: NEGATIVE mg/dL
Hgb urine dipstick: NEGATIVE
Ketones, ur: NEGATIVE mg/dL
Nitrite: NEGATIVE
Protein, ur: NEGATIVE mg/dL
Specific Gravity, Urine: 1.016 (ref 1.005–1.030)
pH: 6.5 (ref 5.0–8.0)

## 2015-08-02 LAB — CBC WITH DIFFERENTIAL/PLATELET
Basophils Absolute: 0 10*3/uL (ref 0.0–0.1)
Basophils Relative: 1 %
Eosinophils Absolute: 0.2 10*3/uL (ref 0.0–0.7)
Eosinophils Relative: 3 %
HCT: 37.4 % (ref 36.0–46.0)
Hemoglobin: 12.4 g/dL (ref 12.0–15.0)
Lymphocytes Relative: 17 %
Lymphs Abs: 1.2 10*3/uL (ref 0.7–4.0)
MCH: 30.2 pg (ref 26.0–34.0)
MCHC: 33.2 g/dL (ref 30.0–36.0)
MCV: 91 fL (ref 78.0–100.0)
Monocytes Absolute: 0.6 10*3/uL (ref 0.1–1.0)
Monocytes Relative: 9 %
Neutro Abs: 4.8 10*3/uL (ref 1.7–7.7)
Neutrophils Relative %: 70 %
Platelets: 221 10*3/uL (ref 150–400)
RBC: 4.11 MIL/uL (ref 3.87–5.11)
RDW: 13 % (ref 11.5–15.5)
WBC: 6.8 10*3/uL (ref 4.0–10.5)

## 2015-08-02 LAB — BASIC METABOLIC PANEL
Anion gap: 7 (ref 5–15)
BUN: 17 mg/dL (ref 6–20)
CO2: 24 mmol/L (ref 22–32)
Calcium: 8.8 mg/dL — ABNORMAL LOW (ref 8.9–10.3)
Chloride: 107 mmol/L (ref 101–111)
Creatinine, Ser: 1.03 mg/dL — ABNORMAL HIGH (ref 0.44–1.00)
GFR calc Af Amer: >60 mL/min (ref >60)
GFR calc non Af Amer: 54 mL/min — ABNORMAL LOW (ref >60)
Glucose, Bld: 104 mg/dL — ABNORMAL HIGH (ref 65–99)
Potassium: 4 mmol/L (ref 3.5–5.1)
Sodium: 138 mmol/L (ref 135–145)

## 2015-08-02 MED ORDER — HYDROCODONE-ACETAMINOPHEN 5-325 MG PO TABS: ORAL_TABLET | ORAL | Status: DC

## 2015-08-02 MED ORDER — ONDANSETRON HCL 4 MG PO TABS: 4.0000 mg | ORAL_TABLET | Freq: Three times a day (TID) | ORAL | Status: DC | PRN

## 2015-08-02 NOTE — ED Notes (Addendum)
Pt reports that she did hit her head on the corner of her nightstand however did not have  LOC.  Denies anticoagulant use.  Pedal pulses are equal b/l.

## 2015-08-02 NOTE — ED Provider Notes (Signed)
CSN: [id number]     Arrival date & time 08/02/15  0515 History   First MD Initiated Contact with Patient 08/02/15 [phone]     Chief Complaint  Patient presents with  . Fall     (Consider location/radiation/quality/duration/timing/severity/associated sxs/prior Treatment) HPI   Blood pressure 133/58, pulse 80, temperature 97.9 F (36.6 C), resp. rate 16, height 5\' 6"  (1.676 m), weight 66.679 kg, SpO2 97 %.  Charlotte Brooks is a 70 y.o. female complaining of Fall occurring last night after she got up to use the restroom, it's unclear if this is a mechanical fall, she can't explain to me exactly what happened, she hit the corner of her head on the nightstand, the nightstand had a glass top that did not break when she hit it. Patient denies loss of consciousness at the fall or anticoagulant loop use however she had severe pain in the left knee and felt with the patella was dislocated laterally, she put the patella back and then lost consciousness at that time. Patient denies cervicalgia (endorses a right trapezius pain), numbness, weakness chest pain, shortness of breath, hip pain, abdominal pain, she took 800 mg of ibuprofen and was given 100 g of fentanyl and route with good relief. States that both of her knees need replacement, she sees Dr. Ricki Rodriguez.   Past Medical History  Diagnosis Date  . Depression   . Osteoporosis     Gets Reclast   Past Surgical History  Procedure Laterality Date  . Hernia repair  2/08  . Knee surgery     Family History  Problem Relation Age of Onset  . Coronary artery disease    . Stroke Mother   . Hypertension Mother   . Diabetes Brother   . Heart disease Brother   . Diabetes Maternal Grandmother   . Diabetes Maternal Grandfather    Social History  Substance Use Topics  . Smoking status: Never Smoker   . Smokeless tobacco: Never Used  . Alcohol Use: Yes     Comment: Occ   OB History    No data available     Review of Systems  10 systems  reviewed and found to be negative, except as noted in the HPI.   Allergies  Penicillins; Codeine; and Sulfonamide derivatives  Home Medications   Prior to Admission medications   Medication Sig Start Date End Date Taking? Authorizing Provider  glucosamine-chondroitin 500-400 MG tablet Take 1 tablet by mouth daily.   Yes Historical Provider, MD  ibuprofen (ADVIL,MOTRIN) 200 MG tablet Take 800 mg by mouth every 6 (six) hours as needed for fever, headache, mild pain, moderate pain or cramping.   Yes Historical Provider, MD  Multiple Vitamin (MULTIVITAMIN WITH MINERALS) TABS tablet Take 1 tablet by mouth daily.   Yes Historical Provider, MD  HYDROcodone-acetaminophen (NORCO/VICODIN) 5-325 MG tablet Take 1-2 tablets by mouth every 6 hours as needed for pain and/or cough. 08/02/15   Ardith Lewman, PA-C  Linaclotide (LINZESS) 290 MCG CAPS capsule Take 1 capsule (290 mcg total) by mouth at bedtime. Patient not taking: Reported on 08/02/2015 11/25/14   Mauri Pole, MD  ondansetron (ZOFRAN) 4 MG tablet Take 1 tablet (4 mg total) by mouth every 8 (eight) hours as needed for nausea or vomiting. 08/02/15   Elmyra Ricks Nat Lowenthal, PA-C  polyethylene glycol powder (GLYCOLAX/MIRALAX) powder Take 17 grams dissolved in liquid every morning. Patient not taking: Reported on 08/02/2015 11/25/14   Mauri Pole, MD   BP 136/58 mmHg  Pulse  73  Temp(Src) 97.9 F (36.6 C)  Resp 16  Ht 5\' 6"  (1.676 m)  Wt 66.679 kg  BMI 23.74 kg/m2  SpO2 97% Physical Exam  Constitutional: She is oriented to person, place, and time. She appears well-developed and well-nourished. No distress.  HENT:  Head: Normocephalic and atraumatic.    Mouth/Throat: Oropharynx is clear and moist.  Eyes: Conjunctivae and EOM are normal. Pupils are equal, round, and reactive to light.  Neck: Normal range of motion.  No midline C-spine  tenderness to palpation or step-offs appreciated. Patient has full range of motion without  pain.  Grip strength, biceps, triceps 5/5 bilaterally;  can differentiate between pinprick and light touch bilaterally.   Cardiovascular: Normal rate, regular rhythm and intact distal pulses.   Pulmonary/Chest: Effort normal and breath sounds normal.  Abdominal: Soft. There is no tenderness.  Musculoskeletal: Normal range of motion. She exhibits edema and tenderness.  Right knee with mild effusion, significant pain on flexion. Grossly stable to posterior and anterior drawer. Distally neurovascularly intact.  She can lift both legs up off the bed, no tenderness palpation of greater trochanter bilaterally  Neurological: She is alert and oriented to person, place, and time.  Skin: She is not diaphoretic.  Psychiatric: She has a normal mood and affect.  Nursing note and vitals reviewed.   ED Course  Procedures (including critical care time) Labs Review Labs Reviewed  BASIC METABOLIC PANEL - Abnormal; Notable for the following:    Glucose, Bld 104 (*)    Creatinine, Ser 1.03 (*)    Calcium 8.8 (*)    GFR calc non Af Amer 54 (*)    All other components within normal limits  URINALYSIS, ROUTINE W REFLEX MICROSCOPIC (NOT AT [facility]) - Abnormal; Notable for the following:    Leukocytes, UA SMALL (*)    All other components within normal limits  URINE MICROSCOPIC-ADD ON - Abnormal; Notable for the following:    Squamous Epithelial / LPF 0-5 (*)    Bacteria, UA RARE (*)    All other components within normal limits  CBC WITH DIFFERENTIAL/PLATELET  I-STAT TROPOININ, ED    Imaging Review Ct Head Wo Contrast  08/02/2015  CLINICAL DATA:  Pain following fall EXAM: CT HEAD WITHOUT CONTRAST CT CERVICAL SPINE WITHOUT CONTRAST TECHNIQUE: Multidetector CT imaging of the head and cervical spine was performed following the standard protocol without intravenous contrast. Multiplanar CT image reconstructions of the cervical spine were also generated. COMPARISON:  Cervical spine MRI November 13, 2003  FINDINGS: CT HEAD FINDINGS The ventricles are normal in size and configuration. There is no intracranial mass, hemorrhage, extra-axial fluid collection, or midline shift. Gray and white compartments appear normal. No acute infarct evident. There is calcification in each distal vertebral artery. No hyperdense vessels are demonstrable. The bony calvarium appears intact. The mastoid air cells are clear. Orbits appear symmetric bilaterally. Visualized paranasal sinuses are clear. CT CERVICAL SPINE FINDINGS There is no fracture or spondylolisthesis. There is cervicothoracic dextroscoliosis. Prevertebral soft tissues and predental space regions are normal. There is mild disc space narrowing at C6-7. Other disc spaces appear normal. There is facet hypertrophy at several levels, most notably C4-5 on the right and C5-6 bilaterally. No disc extrusion or stenosis. IMPRESSION: CT head: Mild distal vertebral artery calcification. No intracranial mass, hemorrhage, or extra-axial fluid collection. Gray-white compartments appear normal. CT cervical spine: No fracture or spondylolisthesis. Cervicothoracic dextroscoliosis noted. Osteoarthritic change at several levels. Electronically Signed   By: Lowella Grip III M.D.  On: 08/02/2015 07:03   Ct Cervical Spine Wo Contrast  08/02/2015  CLINICAL DATA:  Pain following fall EXAM: CT HEAD WITHOUT CONTRAST CT CERVICAL SPINE WITHOUT CONTRAST TECHNIQUE: Multidetector CT imaging of the head and cervical spine was performed following the standard protocol without intravenous contrast. Multiplanar CT image reconstructions of the cervical spine were also generated. COMPARISON:  Cervical spine MRI November 13, 2003 FINDINGS: CT HEAD FINDINGS The ventricles are normal in size and configuration. There is no intracranial mass, hemorrhage, extra-axial fluid collection, or midline shift. Gray and white compartments appear normal. No acute infarct evident. There is calcification in each distal  vertebral artery. No hyperdense vessels are demonstrable. The bony calvarium appears intact. The mastoid air cells are clear. Orbits appear symmetric bilaterally. Visualized paranasal sinuses are clear. CT CERVICAL SPINE FINDINGS There is no fracture or spondylolisthesis. There is cervicothoracic dextroscoliosis. Prevertebral soft tissues and predental space regions are normal. There is mild disc space narrowing at C6-7. Other disc spaces appear normal. There is facet hypertrophy at several levels, most notably C4-5 on the right and C5-6 bilaterally. No disc extrusion or stenosis. IMPRESSION: CT head: Mild distal vertebral artery calcification. No intracranial mass, hemorrhage, or extra-axial fluid collection. Gray-white compartments appear normal. CT cervical spine: No fracture or spondylolisthesis. Cervicothoracic dextroscoliosis noted. Osteoarthritic change at several levels. Electronically Signed   By: Lowella Grip III M.D.   On: 08/02/2015 07:03   Dg Knee Complete 4 Views Right  08/02/2015  CLINICAL DATA:  Pain following fall EXAM: RIGHT KNEE - COMPLETE 4+ VIEW COMPARISON:  Right knee MRI January 22, 2008 FINDINGS: Frontal, lateral, and bilateral oblique views were obtained. No fracture or dislocation is evident. There is a joint effusion. There is no appreciable joint space narrowing. There is subchondral cystic change in the lateral tibial plateau region. IMPRESSION: There is a joint effusion. No fracture or dislocation evident. Subchondral cystic change in the lateral tibial plateau. The joint spaces do not appear narrowed, however. Electronically Signed   By: Lowella Grip III M.D.   On: 08/02/2015 07:36   I have personally reviewed and evaluated these images and lab results as part of my medical decision-making.   EKG Interpretation   Date/Time:  Monday August 02 2015 05:21:32 EDT Ventricular Rate:  82 PR Interval:    QRS Duration: 97 QT Interval:  402 QTC Calculation: 470 R Axis:    60 Text Interpretation:  Sinus rhythm Confirmed by HORTON  MD, COURTNEY  [id number]) on 08/02/2015 6:42:33 AM Also confirmed by HORTON  MD, COURTNEY  [id number]), editor [place]  CCT, BEVERLY ([id number])  on 08/02/2015 7:29:53 AM      MDM   Final diagnoses:  Concussion, without loss of consciousness, initial encounter  Patellar dislocation, right, initial encounter    Filed Vitals:   08/02/15 0515 08/02/15 0521 08/02/15 0719  BP:  133/58 136/58  Pulse:  80 73  Temp:  97.9 F (36.6 C)   Resp:  16 16  Height:  5\' 6"  (1.676 m)   Weight:  66.679 kg   SpO2: 100% 97% 97%    Charlotte Brooks is 70 y.o. female presenting with Fall and likely right patellar dislocation. She also had head trauma without loss of consciousness, neuro exam is nonfocal, she is not anticoagulated. CT head and C-spine without acute abnormality, x-ray of the right knee with joint effusion, subchondral cystic change in the lateral tibial plateau.  Patient likely had a patellar dislocation, will put her knee immobilizer and give  her crutches, she will follow with her orthopedist. Patient declines pain medication.  EKG, bloodwork reassuring.   This is a shared visit with the attending physician who personally evaluated the patient and agrees with the care plan.   Evaluation does not show pathology that would require ongoing emergent intervention or inpatient treatment. Pt is hemodynamically stable and mentating appropriately. Discussed findings and plan with patient/guardian, who agrees with care plan. All questions answered. Return precautions discussed and outpatient follow up given.   New Prescriptions   HYDROCODONE-ACETAMINOPHEN (NORCO/VICODIN) 5-325 MG TABLET    Take 1-2 tablets by mouth every 6 hours as needed for pain and/or cough.   ONDANSETRON (ZOFRAN) 4 MG TABLET    Take 1 tablet (4 mg total) by mouth every 8 (eight) hours as needed for nausea or vomiting.        Monico Blitz, PA-C 08/02/15 [phone]

## 2015-08-02 NOTE — Discharge Instructions (Signed)
Take vicodin for breakthrough pain, do not drink alcohol, drive, care for children or do other critical tasks while taking vicodin.  Keep the knee immobilizer on at all times including when sleeping except when bathing. Do not stop using the knee immobilizer until you are cleared by your orthopedist.  Please be very careful not to fall! The pain medication and crutches puts you at risk for falls. Please rest as much as possible and try to not stay alone.   Do not participate in any sports or any activities that could result in head trauma until you are cleared by your pediatrician,  primary care physician or neurologist.     Concussion, Adult A concussion, or closed-head injury, is a brain injury caused by a direct blow to the head or by a quick and sudden movement (jolt) of the head or neck. Concussions are usually not life-threatening. Even so, the effects of a concussion can be serious. If you have had a concussion before, you are more likely to experience concussion-like symptoms after a direct blow to the head.  CAUSES  Direct blow to the head, such as from running into another player during a soccer game, being hit in a fight, or hitting your head on a hard surface.  A jolt of the head or neck that causes the brain to move back and forth inside the skull, such as in a car crash. SIGNS AND SYMPTOMS The signs of a concussion can be hard to notice. Early on, they may be missed by you, family members, and health care providers. You may look fine but act or feel differently. Symptoms are usually temporary, but they may last for days, weeks, or even longer. Some symptoms may appear right away while others may not show up for hours or days. Every head injury is different. Symptoms include:  Mild to moderate headaches that will not go away.  A feeling of pressure inside your head.  Having more trouble than usual:  Learning or remembering things you have heard.  Answering  questions.  Paying attention or concentrating.  Organizing daily tasks.  Making decisions and solving problems.  Slowness in thinking, acting or reacting, speaking, or reading.  Getting lost or being easily confused.  Feeling tired all the time or lacking energy (fatigued).  Feeling drowsy.  Sleep disturbances.  Sleeping more than usual.  Sleeping less than usual.  Trouble falling asleep.  Trouble sleeping (insomnia).  Loss of balance or feeling lightheaded or dizzy.  Nausea or vomiting.  Numbness or tingling.  Increased sensitivity to:  Sounds.  Lights.  Distractions.  Vision problems or eyes that tire easily.  Diminished sense of taste or smell.  Ringing in the ears.  Mood changes such as feeling sad or anxious.  Becoming easily irritated or angry for little or no reason.  Lack of motivation.  Seeing or hearing things other people do not see or hear (hallucinations). DIAGNOSIS Your health care provider can usually diagnose a concussion based on a description of your injury and symptoms. He or she will ask whether you passed out (lost consciousness) and whether you are having trouble remembering events that happened right before and during your injury. Your evaluation might include:  A brain scan to look for signs of injury to the brain. Even if the test shows no injury, you may still have a concussion.  Blood tests to be sure other problems are not present. TREATMENT  Concussions are usually treated in an emergency department, in urgent  care, or at a clinic. You may need to stay in the hospital overnight for further treatment.  Tell your health care provider if you are taking any medicines, including prescription medicines, over-the-counter medicines, and natural remedies. Some medicines, such as blood thinners (anticoagulants) and aspirin, may increase the chance of complications. Also tell your health care provider whether you have had alcohol or  are taking illegal drugs. This information may affect treatment.  Your health care provider will send you home with important instructions to follow.  How fast you will recover from a concussion depends on many factors. These factors include how severe your concussion is, what part of your brain was injured, your age, and how healthy you were before the concussion.  Most people with mild injuries recover fully. Recovery can take time. In general, recovery is slower in older persons. Also, persons who have had a concussion in the past or have other medical problems may find that it takes longer to recover from their current injury. HOME CARE INSTRUCTIONS General Instructions  Carefully follow the directions your health care provider gave you.  Only take over-the-counter or prescription medicines for pain, discomfort, or fever as directed by your health care provider.  Take only those medicines that your health care provider has approved.  Do not drink alcohol until your health care provider says you are well enough to do so. Alcohol and certain other drugs may slow your recovery and can put you at risk of further injury.  If it is harder than usual to remember things, write them down.  If you are easily distracted, try to do one thing at a time. For example, do not try to watch TV while fixing dinner.  Talk with family members or close friends when making important decisions.  Keep all follow-up appointments. Repeated evaluation of your symptoms is recommended for your recovery.  Watch your symptoms and tell others to do the same. Complications sometimes occur after a concussion. Older adults with a brain injury may have a higher risk of serious complications, such as a blood clot on the brain.  Tell your teachers, school nurse, school counselor, coach, athletic trainer, or work Freight forwarder about your injury, symptoms, and restrictions. Tell them about what you can or cannot do. They should  watch for:  Increased problems with attention or concentration.  Increased difficulty remembering or learning new information.  Increased time needed to complete tasks or assignments.  Increased irritability or decreased ability to cope with stress.  Increased symptoms.  Rest. Rest helps the brain to heal. Make sure you:  Get plenty of sleep at night. Avoid staying up late at night.  Keep the same bedtime hours on weekends and weekdays.  Rest during the day. Take daytime naps or rest breaks when you feel tired.  Limit activities that require a lot of thought or concentration. These include:  Doing homework or job-related work.  Watching TV.  Working on the computer.  Avoid any situation where there is potential for another head injury (football, hockey, soccer, basketball, martial arts, downhill snow sports and horseback riding). Your condition will get worse every time you experience a concussion. You should avoid these activities until you are evaluated by the appropriate follow-up health care providers. Returning To Your Regular Activities You will need to return to your normal activities slowly, not all at once. You must give your body and brain enough time for recovery.  Do not return to sports or other athletic activities until your  health care provider tells you it is safe to do so.  Ask your health care provider when you can drive, ride a bicycle, or operate heavy machinery. Your ability to react may be slower after a brain injury. Never do these activities if you are dizzy.  Ask your health care provider about when you can return to work or school. Preventing Another Concussion It is very important to avoid another brain injury, especially before you have recovered. In rare cases, another injury can lead to permanent brain damage, brain swelling, or death. The risk of this is greatest during the first 7-10 days after a head injury. Avoid injuries by:  Wearing a seat  belt when riding in a car.  Drinking alcohol only in moderation.  Wearing a helmet when biking, skiing, skateboarding, skating, or doing similar activities.  Avoiding activities that could lead to a second concussion, such as contact or recreational sports, until your health care provider says it is okay.  Taking safety measures in your home.  Remove clutter and tripping hazards from floors and stairways.  Use grab bars in bathrooms and handrails by stairs.  Place non-slip mats on floors and in bathtubs.  Improve lighting in dim areas. SEEK MEDICAL CARE IF:  You have increased problems paying attention or concentrating.  You have increased difficulty remembering or learning new information.  You need more time to complete tasks or assignments than before.  You have increased irritability or decreased ability to cope with stress.  You have more symptoms than before. Seek medical care if you have any of the following symptoms for more than 2 weeks after your injury:  Lasting (chronic) headaches.  Dizziness or balance problems.  Nausea.  Vision problems.  Increased sensitivity to noise or light.  Depression or mood swings.  Anxiety or irritability.  Memory problems.  Difficulty concentrating or paying attention.  Sleep problems.  Feeling tired all the time. SEEK IMMEDIATE MEDICAL CARE IF:  You have severe or worsening headaches. These may be a sign of a blood clot in the brain.  You have weakness (even if only in one hand, leg, or part of the face).  You have numbness.  You have decreased coordination.  You vomit repeatedly.  You have increased sleepiness.  One pupil is larger than the other.  You have convulsions.  You have slurred speech.  You have increased confusion. This may be a sign of a blood clot in the brain.  You have increased restlessness, agitation, or irritability.  You are unable to recognize people or places.  You have neck  pain.  It is difficult to wake you up.  You have unusual behavior changes.  You lose consciousness. MAKE SURE YOU:  Understand these instructions.  Will watch your condition.  Will get help right away if you are not doing well or get worse.   This information is not intended to replace advice given to you by your health care provider. Make sure you discuss any questions you have with your health care provider.   Document Released: 03/25/2003 Document Revised: 01/23/2014 Document Reviewed: 07/25/2012 Elsevier Interactive Patient Education [employer].

## 2015-08-02 NOTE — ED Provider Notes (Signed)
Medical screening examination/treatment/procedure(s) were conducted as a shared visit with non-physician practitioner(s) and myself.  I personally evaluated the patient during the encounter.   EKG Interpretation   Date/Time:  Monday August 02 2015 05:21:32 EDT Ventricular Rate:  82 PR Interval:    QRS Duration: 97 QT Interval:  402 QTC Calculation: 470 R Axis:   60 Text Interpretation:  Sinus rhythm Confirmed by HORTON  MD, COURTNEY  [id number]) on 08/02/2015 6:42:33 AM Also confirmed by Dina Rich  MD, COURTNEY  [id number]), editor [place]  CCT, BEVERLY ([id number])  on 08/02/2015 7:29:53 AM     Patient here after mechanical fall. X-rays reviewed. Right knee effusion noted likely from trauma. Orthopedic referral given  Lacretia Leigh, MD 08/02/15 [phone]

## 2015-08-02 NOTE — ED Notes (Signed)
Pt stated her IV was causing her severe pain.  Per pt's request IV was removed.  Importance of IV access explained to patient.

## 2015-08-02 NOTE — ED Notes (Signed)
Ortho tech at bedside 

## 2015-08-02 NOTE — ED Notes (Signed)
Per EMS pt fell while trying to get OOB, dislocating right patella, which pt reduced prior to EMS arrival.  Pt has is c/o right knee and right neck pain.  Denies hitting head or LOC.  Pt given 100 mcg of fentanyl en route.  Pt is A&O x 3.

## 2015-08-02 NOTE — ED Notes (Signed)
Bed: WA03 Expected date:  Expected time:  Means of arrival:  Comments: EMS/fall/dislocated patella

## 2015-09-22 LAB — HM MAMMOGRAPHY

## 2015-10-15 ENCOUNTER — Encounter: Payer: Self-pay | Admitting: Family Medicine

## 2015-11-23 ENCOUNTER — Ambulatory Visit (INDEPENDENT_AMBULATORY_CARE_PROVIDER_SITE_OTHER): Payer: Medicare Other | Admitting: Family Medicine

## 2015-11-23 ENCOUNTER — Encounter: Payer: Self-pay | Admitting: Family Medicine

## 2015-11-23 VITALS — BP 118/66 | HR 97 | Temp 98.0°F | Ht 63.0 in | Wt 153.4 lb

## 2015-11-23 DIAGNOSIS — R51 Headache: Secondary | ICD-10-CM | POA: Diagnosis not present

## 2015-11-23 DIAGNOSIS — R519 Headache, unspecified: Secondary | ICD-10-CM

## 2015-11-23 DIAGNOSIS — Z0000 Encounter for general adult medical examination without abnormal findings: Secondary | ICD-10-CM | POA: Diagnosis not present

## 2015-11-23 DIAGNOSIS — M810 Age-related osteoporosis without current pathological fracture: Secondary | ICD-10-CM

## 2015-11-23 DIAGNOSIS — G4700 Insomnia, unspecified: Secondary | ICD-10-CM | POA: Diagnosis not present

## 2015-11-23 LAB — VITAMIN D 25 HYDROXY (VIT D DEFICIENCY, FRACTURES): VITD: 31.23 ng/mL (ref 30.00–100.00)

## 2015-11-23 LAB — COMPREHENSIVE METABOLIC PANEL
ALT: 14 U/L (ref 0–35)
AST: 24 U/L (ref 0–37)
Albumin: 4.4 g/dL (ref 3.5–5.2)
Alkaline Phosphatase: 78 U/L (ref 39–117)
BUN: 13 mg/dL (ref 6–23)
CO2: 29 mEq/L (ref 19–32)
Calcium: 9.9 mg/dL (ref 8.4–10.5)
Chloride: 103 mEq/L (ref 96–112)
Creatinine, Ser: 0.88 mg/dL (ref 0.40–1.20)
GFR: 67.44 mL/min (ref >60.00)
Glucose, Bld: 90 mg/dL (ref 70–99)
Potassium: 4.1 mEq/L (ref 3.5–5.1)
Sodium: 139 mEq/L (ref 135–145)
Total Bilirubin: 0.6 mg/dL (ref 0.2–1.2)
Total Protein: 8.3 g/dL (ref 6.0–8.3)

## 2015-11-23 LAB — LIPID PANEL
Cholesterol: 184 mg/dL (ref 0–200)
HDL: 49.9 mg/dL (ref >39.00)
LDL Cholesterol: 105 mg/dL — ABNORMAL HIGH (ref 0–99)
NonHDL: 133.62
Total CHOL/HDL Ratio: 4
Triglycerides: 144 mg/dL (ref 0.0–149.0)
VLDL: 28.8 mg/dL (ref 0.0–40.0)

## 2015-11-23 LAB — CBC WITH DIFFERENTIAL/PLATELET
Basophils Absolute: 0.1 10*3/uL (ref 0.0–0.1)
Basophils Relative: 0.9 % (ref 0.0–3.0)
Eosinophils Absolute: 0.7 10*3/uL (ref 0.0–0.7)
Eosinophils Relative: 8.7 % — ABNORMAL HIGH (ref 0.0–5.0)
HCT: 40.1 % (ref 36.0–46.0)
Hemoglobin: 13.6 g/dL (ref 12.0–15.0)
Lymphocytes Relative: 35.1 % (ref 12.0–46.0)
Lymphs Abs: 2.7 10*3/uL (ref 0.7–4.0)
MCHC: 33.8 g/dL (ref 30.0–36.0)
MCV: 87.9 fl (ref 78.0–100.0)
Monocytes Absolute: 0.5 10*3/uL (ref 0.1–1.0)
Monocytes Relative: 6.7 % (ref 3.0–12.0)
Neutro Abs: 3.7 10*3/uL (ref 1.4–7.7)
Neutrophils Relative %: 48.6 % (ref 43.0–77.0)
Platelets: 192 10*3/uL (ref 150.0–400.0)
RBC: 4.56 Mil/uL (ref 3.87–5.11)
RDW: 12.9 % (ref 11.5–15.5)
WBC: 7.6 10*3/uL (ref 4.0–10.5)

## 2015-11-23 LAB — POCT URINALYSIS DIPSTICK
Bilirubin, UA: NEGATIVE
Blood, UA: NEGATIVE
Glucose, UA: NEGATIVE
Ketones, UA: NEGATIVE
Leukocytes, UA: NEGATIVE
Nitrite, UA: NEGATIVE
Protein, UA: NEGATIVE
Spec Grav, UA: 1.015
Urobilinogen, UA: 0.2
pH, UA: 6

## 2015-11-23 MED ORDER — TRAZODONE HCL 50 MG PO TABS: 25.0000 mg | ORAL_TABLET | Freq: Every evening | ORAL | 3 refills | Status: DC | PRN

## 2015-11-23 NOTE — Progress Notes (Addendum)
Subjective:   Charlotte Brooks is a 70 y.o. female who presents for Medicare Annual (Subsequent) preventive examination. Pt c/o insomnia that is getting worse-- she is in bedroom by 8 pm to relax ---- lights out about 930-10 pm and she is awake by 3 am  She also c/o daily headaches x 2 weeks--- ? Sinus pressure She is under care of opth for ---visual changes--- + head injury in July -- CT ;neg  Review of Systems: \  Review of Systems  Constitutional: Negative for activity change, appetite change and fatigue.  HENT: Negative for hearing loss, congestion, tinnitus and ear discharge.   Eyes: Negative for visual disturbance (see optho q1y -- vision corrected to 20/20 with glasses).  Respiratory: Negative for cough, chest tightness and shortness of breath.   Cardiovascular: Negative for chest pain, palpitations and leg swelling.  Gastrointestinal: Negative for abdominal pain, diarrhea, constipation and abdominal distention.  Genitourinary: Negative for urgency, frequency, decreased urine volume and difficulty urinating.  Musculoskeletal: Negative for back pain, arthralgias and gait problem.  Skin: Negative for color change, pallor and rash.  Neurological: Negative for dizziness, light-headedness, numbness and headaches.  Hematological: Negative for adenopathy. Does not bruise/bleed easily.  Psychiatric/Behavioral: Negative for suicidal ideas, confusion, sleep disturbance, self-injury, dysphoric mood, decreased concentration and agitation.  Pt is able to read and write and can do all ADLs No risk for falling No abuse/ violence in home          Objective:     Vitals: BP 118/66 (BP Location: Left Arm, Patient Position: Sitting, Cuff Size: Small)   Pulse 97   Temp 98 F (36.7 C) (Oral)   Ht 5\' 3"  (1.6 m)   Wt 153 lb 6.4 oz (69.6 kg)   SpO2 97%   BMI 27.17 kg/m   Body mass index is 27.17 kg/m. BP 118/66 (BP Location: Left Arm, Patient Position: Sitting, Cuff Size: Small)   Pulse  97   Temp 98 F (36.7 C) (Oral)   Ht 5\' 3"  (1.6 m)   Wt 153 lb 6.4 oz (69.6 kg)   SpO2 97%   BMI 27.17 kg/m  General appearance: alert, cooperative, appears stated age and no distress Head: Normocephalic, without obvious abnormality, atraumatic Eyes: conjunctivae/corneas clear. PERRL, EOM's intact. Fundi benign. Ears: normal TM's and external ear canals both ears Nose: Nares normal. Septum midline. Mucosa normal. No drainage or sinus tenderness. Throat: lips, mucosa, and tongue normal; teeth and gums normal Neck: no adenopathy, no carotid bruit, no JVD, supple, symmetrical, trachea midline and thyroid not enlarged, symmetric, no tenderness/mass/nodules Back: symmetric, no curvature. ROM normal. No CVA tenderness. Lungs: clear to auscultation bilaterally Breasts: normal appearance, no masses or tenderness Heart: regular rate and rhythm, S1, S2 normal, no murmur, click, rub or gallop Abdomen: soft, non-tender; bowel sounds normal; no masses,  no organomegaly Pelvic: not indicated; post-menopausal, no abnormal Pap smears in past Extremities: extremities normal, atraumatic, no cyanosis or edema Pulses: 2+ and symmetric Skin: Skin color, texture, turgor normal. No rashes or lesions Lymph nodes: Cervical, supraclavicular, and axillary nodes normal. Neurologic: Alert and oriented X 3, normal strength and tone. Normal symmetric reflexes. Normal coordination and gait  Tobacco History  Smoking Status  . Never Smoker  Smokeless Tobacco  . Never Used     Counseling given: Not Answered   Past Medical History:  Diagnosis Date  . Depression   . Osteoporosis    Gets Reclast   Past Surgical History:  Procedure Laterality Date  .  HERNIA REPAIR  2/08  . KNEE SURGERY     Family History  Problem Relation Age of Onset  . Stroke Mother   . Hypertension Mother   . Diabetes Brother   . Heart disease Brother   . Coronary artery disease    . Diabetes Maternal Grandmother   . Diabetes  Maternal Grandfather    History  Sexual Activity  . Sexual activity: Yes  . Partners: Male    Outpatient Encounter Prescriptions as of 11/23/2015  Medication Sig  . aspirin 81 MG chewable tablet Chew 81 mg by mouth daily.  . Biotin 5000 MCG CAPS Take 1 capsule by mouth daily.  . Cholecalciferol (VITAMIN D-3) 1000 units CAPS Take 1 capsule by mouth daily.  Marland Kitchen glucosamine-chondroitin 500-400 MG tablet Take 1 tablet by mouth daily.  . Multiple Minerals-Vitamins (CALCIUM-MAGNESIUM-ZINC-D3) TABS Take 1 tablet by mouth daily.  . Multiple Vitamin (MULTIVITAMIN WITH MINERALS) TABS tablet Take 1 tablet by mouth daily.  . polyethylene glycol powder (GLYCOLAX/MIRALAX) powder Take 17 grams dissolved in liquid every morning.  . vitamin C (ASCORBIC ACID) 500 MG tablet Take 500 mg by mouth daily.  Marland Kitchen ibuprofen (ADVIL,MOTRIN) 200 MG tablet Take 800 mg by mouth every 6 (six) hours as needed for fever, headache, mild pain, moderate pain or cramping.  . Linaclotide (LINZESS) 290 MCG CAPS capsule Take 1 capsule (290 mcg total) by mouth at bedtime. (Patient not taking: Reported on 11/23/2015)  . ondansetron (ZOFRAN) 4 MG tablet Take 1 tablet (4 mg total) by mouth every 8 (eight) hours as needed for nausea or vomiting. (Patient not taking: Reported on 11/23/2015)  . traZODone (DESYREL) 50 MG tablet Take 0.5-1 tablets (25-50 mg total) by mouth at bedtime as needed for sleep.  . [DISCONTINUED] HYDROcodone-acetaminophen (NORCO/VICODIN) 5-325 MG tablet Take 1-2 tablets by mouth every 6 hours as needed for pain and/or cough. (Patient not taking: Reported on 11/23/2015)   No facility-administered encounter medications on file as of 11/23/2015.     Activities of Daily Living In your present state of health, do you have any difficulty performing the following activities: 11/23/2015 11/23/2015  Hearing? N N  Vision? N -  Difficulty concentrating or making decisions? N -  Walking or climbing stairs? N -  Dressing or  bathing? N -  Doing errands, shopping? N -  Preparing Food and eating ? N -  Using the Toilet? N -  In the past six months, have you accidently leaked urine? N -  Do you have problems with loss of bowel control? N -  Managing your Medications? N -  Managing your Finances? N -  Housekeeping or managing your Housekeeping? N -  Some recent data might be hidden    Patient Care Team: Ann Held, DO as PCP - Curtiss, OD as Referring Physician (Optometry) Iona Beard, MD as Referring Physician (Optometry) Mauri Pole, MD as Consulting Physician (Gastroenterology)    Assessment:    CPE Exercise Activities and Dietary recommendations Current Exercise Habits: Home exercise routine, Type of exercise: yoga, Intensity: Moderate, Exercise limited by: None identified  Goals    None     Fall Risk Fall Risk  11/23/2015 11/23/2015 11/23/2015 06/29/2014 06/29/2014  Falls in the past year? Yes Yes Yes No No  Number falls in past yr: 1 1 1  - -  Injury with Fall? Yes Yes Yes - -  Risk for fall due to : History of fall(s) - - - -   Depression  Screen PHQ 2/9 Scores 11/23/2015 11/23/2015 06/29/2014 06/29/2014  PHQ - 2 Score 0 0 0 0     Cognitive Function MMSE - Mini Mental State Exam 11/23/2015  Orientation to time 5  Orientation to Place 5  Registration 3  Attention/ Calculation 5  Recall 3  Language- name 2 objects 2  Language- repeat 1  Language- follow 3 step command 3  Language- read & follow direction 1  Write a sentence 1  Copy design 1  Total score 30     6CIT Screen 11/23/2015  What Year? 0 points  What month? 0 points  What time? 0 points  Count back from 20 0 points  Months in reverse 0 points  Repeat phrase 0 points  Total Score 0    Immunization History  Administered Date(s) Administered  . Zoster 05/01/2007   Screening Tests Health Maintenance  Topic Date Due  . Hepatitis C Screening  1945/02/24  . TETANUS/TDAP  06/29/1964  . PNA vac Low  Risk Adult (1 of 2 - PCV13) 06/30/2010  . INFLUENZA VACCINE  08/17/2015  . COLONOSCOPY  05/23/2016  . MAMMOGRAM  09/21/2017  . DEXA SCAN  Completed  . ZOSTAVAX  Completed      Plan:   see AVS During the course of the visit the patient was educated and counseled about the following appropriate screening and preventive services:   Vaccines to include Pneumoccal, Influenza, Hepatitis B, Td, Zostavax, HCV  Electrocardiogram  Cardiovascular Disease  Colorectal cancer screening  Bone density screening  Diabetes screening  Glaucoma screening  Mammography/PAP  Nutrition counseling   Patient Instructions (the written plan) was given to the patient.  1. Osteoporosis, unspecified osteoporosis type, unspecified pathological fracture presence Check bmd - DG Bone Density; Future - Comprehensive metabolic panel - Lipid panel - CBC with Differential/Platelet - POCT urinalysis dipstick - Vitamin D (25 hydroxy)  2. Insomnia, unspecified type  - Comprehensive metabolic panel - Lipid panel - CBC with Differential/Platelet - POCT urinalysis dipstick - Vitamin D (25 hydroxy) - traZODone (DESYREL) 50 MG tablet; Take 0.5-1 tablets (25-50 mg total) by mouth at bedtime as needed for sleep.  Dispense: 30 tablet; Refill: 3  3. Frequent headaches  - Comprehensive metabolic panel - Lipid panel - CBC with Differential/Platelet - POCT urinalysis dipstick - Vitamin D (25 hydroxy)  4. Encounter for Medicare annual wellness exam See above  Ann Held, DO  11/24/2015

## 2015-11-23 NOTE — Progress Notes (Signed)
Pre visit review using our clinic review tool, if applicable. No additional management support is needed unless otherwise documented below in the visit note. 

## 2015-11-23 NOTE — Patient Instructions (Signed)
Preventive Care for Adults, Female A healthy lifestyle and preventive care can promote health and wellness. Preventive health guidelines for women include the following key practices.  A routine yearly physical is a good way to check with your health care provider about your health and preventive screening. It is a chance to share any concerns and updates on your health and to receive a thorough exam.  Visit your dentist for a routine exam and preventive care every 6 months. Brush your teeth twice a day and floss once a day. Good oral hygiene prevents tooth decay and gum disease.  The frequency of eye exams is based on your age, health, family medical history, use of contact lenses, and other factors. Follow your health care provider's recommendations for frequency of eye exams.  Eat a healthy diet. Foods like vegetables, fruits, whole grains, low-fat dairy products, and lean protein foods contain the nutrients you need without too many calories. Decrease your intake of foods high in solid fats, added sugars, and salt. Eat the right amount of calories for you.Get information about a proper diet from your health care provider, if necessary.  Regular physical exercise is one of the most important things you can do for your health. Most adults should get at least 150 minutes of moderate-intensity exercise (any activity that increases your heart rate and causes you to sweat) each week. In addition, most adults need muscle-strengthening exercises on 2 or more days a week.  Maintain a healthy weight. The body mass index (BMI) is a screening tool to identify possible weight problems. It provides an estimate of body fat based on height and weight. Your health care provider can find your BMI and can help you achieve or maintain a healthy weight.For adults 20 years and older:  A BMI below 18.5 is considered underweight.  A BMI of 18.5 to 24.9 is normal.  A BMI of 25 to 29.9 is considered overweight.  A  BMI of 30 and above is considered obese.  Maintain normal blood lipids and cholesterol levels by exercising and minimizing your intake of saturated fat. Eat a balanced diet with plenty of fruit and vegetables. Blood tests for lipids and cholesterol should begin at age 45 and be repeated every 5 years. If your lipid or cholesterol levels are high, you are over 50, or you are at high risk for heart disease, you may need your cholesterol levels checked more frequently.Ongoing high lipid and cholesterol levels should be treated with medicines if diet and exercise are not working.  If you smoke, find out from your health care provider how to quit. If you do not use tobacco, do not start.  Lung cancer screening is recommended for adults aged 45-80 years who are at high risk for developing lung cancer because of a history of smoking. A yearly low-dose CT scan of the lungs is recommended for people who have at least a 30-pack-year history of smoking and are a current smoker or have quit within the past 15 years. A pack year of smoking is smoking an average of 1 pack of cigarettes a day for 1 year (for example: 1 pack a day for 30 years or 2 packs a day for 15 years). Yearly screening should continue until the smoker has stopped smoking for at least 15 years. Yearly screening should be stopped for people who develop a health problem that would prevent them from having lung cancer treatment.  If you are pregnant, do not drink alcohol. If you are  breastfeeding, be very cautious about drinking alcohol. If you are not pregnant and choose to drink alcohol, do not have more than 1 drink per day. One drink is considered to be 12 ounces (355 mL) of beer, 5 ounces (148 mL) of wine, or 1.5 ounces (44 mL) of liquor.  Avoid use of street drugs. Do not share needles with anyone. Ask for help if you need support or instructions about stopping the use of drugs.  High blood pressure causes heart disease and increases the risk  of stroke. Your blood pressure should be checked at least every 1 to 2 years. Ongoing high blood pressure should be treated with medicines if weight loss and exercise do not work.  If you are 55-79 years old, ask your health care provider if you should take aspirin to prevent strokes.  Diabetes screening is done by taking a blood sample to check your blood glucose level after you have not eaten for a certain period of time (fasting). If you are not overweight and you do not have risk factors for diabetes, you should be screened once every 3 years starting at age 45. If you are overweight or obese and you are 40-70 years of age, you should be screened for diabetes every year as part of your cardiovascular risk assessment.  Breast cancer screening is essential preventive care for women. You should practice "breast self-awareness." This means understanding the normal appearance and feel of your breasts and may include breast self-examination. Any changes detected, no matter how small, should be reported to a health care provider. Women in their 20s and 30s should have a clinical breast exam (CBE) by a health care provider as part of a regular health exam every 1 to 3 years. After age 40, women should have a CBE every year. Starting at age 40, women should consider having a mammogram (breast X-ray test) every year. Women who have a family history of breast cancer should talk to their health care provider about genetic screening. Women at a high risk of breast cancer should talk to their health care providers about having an MRI and a mammogram every year.  Breast cancer gene (BRCA)-related cancer risk assessment is recommended for women who have family members with BRCA-related cancers. BRCA-related cancers include breast, ovarian, tubal, and peritoneal cancers. Having family members with these cancers may be associated with an increased risk for harmful changes (mutations) in the breast cancer genes BRCA1 and  BRCA2. Results of the assessment will determine the need for genetic counseling and BRCA1 and BRCA2 testing.  Your health care provider may recommend that you be screened regularly for cancer of the pelvic organs (ovaries, uterus, and vagina). This screening involves a pelvic examination, including checking for microscopic changes to the surface of your cervix (Pap test). You may be encouraged to have this screening done every 3 years, beginning at age 21.  For women ages 30-65, health care providers may recommend pelvic exams and Pap testing every 3 years, or they may recommend the Pap and pelvic exam, combined with testing for human papilloma virus (HPV), every 5 years. Some types of HPV increase your risk of cervical cancer. Testing for HPV may also be done on women of any age with unclear Pap test results.  Other health care providers may not recommend any screening for nonpregnant women who are considered low risk for pelvic cancer and who do not have symptoms. Ask your health care provider if a screening pelvic exam is right for   you.  If you have had past treatment for cervical cancer or a condition that could lead to cancer, you need Pap tests and screening for cancer for at least 20 years after your treatment. If Pap tests have been discontinued, your risk factors (such as having a new sexual partner) need to be reassessed to determine if screening should resume. Some women have medical problems that increase the chance of getting cervical cancer. In these cases, your health care provider may recommend more frequent screening and Pap tests.  Colorectal cancer can be detected and often prevented. Most routine colorectal cancer screening begins at the age of 50 years and continues through age 75 years. However, your health care provider may recommend screening at an earlier age if you have risk factors for colon cancer. On a yearly basis, your health care provider may provide home test kits to check  for hidden blood in the stool. Use of a small camera at the end of a tube, to directly examine the colon (sigmoidoscopy or colonoscopy), can detect the earliest forms of colorectal cancer. Talk to your health care provider about this at age 50, when routine screening begins. Direct exam of the colon should be repeated every 5-10 years through age 75 years, unless early forms of precancerous polyps or small growths are found.  People who are at an increased risk for hepatitis B should be screened for this virus. You are considered at high risk for hepatitis B if:  You were born in a country where hepatitis B occurs often. Talk with your health care provider about which countries are considered high risk.  Your parents were born in a high-risk country and you have not received a shot to protect against hepatitis B (hepatitis B vaccine).  You have HIV or AIDS.  You use needles to inject street drugs.  You live with, or have sex with, someone who has hepatitis B.  You get hemodialysis treatment.  You take certain medicines for conditions like cancer, organ transplantation, and autoimmune conditions.  Hepatitis C blood testing is recommended for all people born from 1945 through 1965 and any individual with known risks for hepatitis C.  Practice safe sex. Use condoms and avoid high-risk sexual practices to reduce the spread of sexually transmitted infections (STIs). STIs include gonorrhea, chlamydia, syphilis, trichomonas, herpes, HPV, and human immunodeficiency virus (HIV). Herpes, HIV, and HPV are viral illnesses that have no cure. They can result in disability, cancer, and death.  You should be screened for sexually transmitted illnesses (STIs) including gonorrhea and chlamydia if:  You are sexually active and are younger than 24 years.  You are older than 24 years and your health care provider tells you that you are at risk for this type of infection.  Your sexual activity has changed  since you were last screened and you are at an increased risk for chlamydia or gonorrhea. Ask your health care provider if you are at risk.  If you are at risk of being infected with HIV, it is recommended that you take a prescription medicine daily to prevent HIV infection. This is called preexposure prophylaxis (PrEP). You are considered at risk if:  You are sexually active and do not regularly use condoms or know the HIV status of your partner(s).  You take drugs by injection.  You are sexually active with a partner who has HIV.  Talk with your health care provider about whether you are at high risk of being infected with HIV. If   you choose to begin PrEP, you should first be tested for HIV. You should then be tested every 3 months for as long as you are taking PrEP.  Osteoporosis is a disease in which the bones lose minerals and strength with aging. This can result in serious bone fractures or breaks. The risk of osteoporosis can be identified using a bone density scan. Women ages 67 years and over and women at risk for fractures or osteoporosis should discuss screening with their health care providers. Ask your health care provider whether you should take a calcium supplement or vitamin D to reduce the rate of osteoporosis.  Menopause can be associated with physical symptoms and risks. Hormone replacement therapy is available to decrease symptoms and risks. You should talk to your health care provider about whether hormone replacement therapy is right for you.  Use sunscreen. Apply sunscreen liberally and repeatedly throughout the day. You should seek shade when your shadow is shorter than you. Protect yourself by wearing long sleeves, pants, a wide-brimmed hat, and sunglasses year round, whenever you are outdoors.  Once a month, do a whole body skin exam, using a mirror to look at the skin on your back. Tell your health care provider of new moles, moles that have irregular borders, moles that  are larger than a pencil eraser, or moles that have changed in shape or color.  Stay current with required vaccines (immunizations).  Influenza vaccine. All adults should be immunized every year.  Tetanus, diphtheria, and acellular pertussis (Td, Tdap) vaccine. Pregnant women should receive 1 dose of Tdap vaccine during each pregnancy. The dose should be obtained regardless of the length of time since the last dose. Immunization is preferred during the 27th-36th week of gestation. An adult who has not previously received Tdap or who does not know her vaccine status should receive 1 dose of Tdap. This initial dose should be followed by tetanus and diphtheria toxoids (Td) booster doses every 10 years. Adults with an unknown or incomplete history of completing a 3-dose immunization series with Td-containing vaccines should begin or complete a primary immunization series including a Tdap dose. Adults should receive a Td booster every 10 years.  Varicella vaccine. An adult without evidence of immunity to varicella should receive 2 doses or a second dose if she has previously received 1 dose. Pregnant females who do not have evidence of immunity should receive the first dose after pregnancy. This first dose should be obtained before leaving the health care facility. The second dose should be obtained 4-8 weeks after the first dose.  Human papillomavirus (HPV) vaccine. Females aged 13-26 years who have not received the vaccine previously should obtain the 3-dose series. The vaccine is not recommended for use in pregnant females. However, pregnancy testing is not needed before receiving a dose. If a female is found to be pregnant after receiving a dose, no treatment is needed. In that case, the remaining doses should be delayed until after the pregnancy. Immunization is recommended for any person with an immunocompromised condition through the age of 61 years if she did not get any or all doses earlier. During the  3-dose series, the second dose should be obtained 4-8 weeks after the first dose. The third dose should be obtained 24 weeks after the first dose and 16 weeks after the second dose.  Zoster vaccine. One dose is recommended for adults aged 30 years or older unless certain conditions are present.  Measles, mumps, and rubella (MMR) vaccine. Adults born  before 1957 generally are considered immune to measles and mumps. Adults born in 1957 or later should have 1 or more doses of MMR vaccine unless there is a contraindication to the vaccine or there is laboratory evidence of immunity to each of the three diseases. A routine second dose of MMR vaccine should be obtained at least 28 days after the first dose for students attending postsecondary schools, health care workers, or international travelers. People who received inactivated measles vaccine or an unknown type of measles vaccine during 1963-1967 should receive 2 doses of MMR vaccine. People who received inactivated mumps vaccine or an unknown type of mumps vaccine before 1979 and are at high risk for mumps infection should consider immunization with 2 doses of MMR vaccine. For females of childbearing age, rubella immunity should be determined. If there is no evidence of immunity, females who are not pregnant should be vaccinated. If there is no evidence of immunity, females who are pregnant should delay immunization until after pregnancy. Unvaccinated health care workers born before 1957 who lack laboratory evidence of measles, mumps, or rubella immunity or laboratory confirmation of disease should consider measles and mumps immunization with 2 doses of MMR vaccine or rubella immunization with 1 dose of MMR vaccine.  Pneumococcal 13-valent conjugate (PCV13) vaccine. When indicated, a person who is uncertain of his immunization history and has no record of immunization should receive the PCV13 vaccine. All adults 65 years of age and older should receive this  vaccine. An adult aged 19 years or older who has certain medical conditions and has not been previously immunized should receive 1 dose of PCV13 vaccine. This PCV13 should be followed with a dose of pneumococcal polysaccharide (PPSV23) vaccine. Adults who are at high risk for pneumococcal disease should obtain the PPSV23 vaccine at least 8 weeks after the dose of PCV13 vaccine. Adults older than 70 years of age who have normal immune system function should obtain the PPSV23 vaccine dose at least 1 year after the dose of PCV13 vaccine.  Pneumococcal polysaccharide (PPSV23) vaccine. When PCV13 is also indicated, PCV13 should be obtained first. All adults aged 65 years and older should be immunized. An adult younger than age 65 years who has certain medical conditions should be immunized. Any person who resides in a nursing home or long-term care facility should be immunized. An adult smoker should be immunized. People with an immunocompromised condition and certain other conditions should receive both PCV13 and PPSV23 vaccines. People with human immunodeficiency virus (HIV) infection should be immunized as soon as possible after diagnosis. Immunization during chemotherapy or radiation therapy should be avoided. Routine use of PPSV23 vaccine is not recommended for American Indians, [place] Natives, or people younger than 65 years unless there are medical conditions that require PPSV23 vaccine. When indicated, people who have unknown immunization and have no record of immunization should receive PPSV23 vaccine. One-time revaccination 5 years after the first dose of PPSV23 is recommended for people aged 19-64 years who have chronic kidney failure, nephrotic syndrome, asplenia, or immunocompromised conditions. People who received 1-2 doses of PPSV23 before age 65 years should receive another dose of PPSV23 vaccine at age 65 years or later if at least 5 years have passed since the previous dose. Doses of PPSV23 are not  needed for people immunized with PPSV23 at or after age 65 years.  Meningococcal vaccine. Adults with asplenia or persistent complement component deficiencies should receive 2 doses of quadrivalent meningococcal conjugate (MenACWY-D) vaccine. The doses should be obtained   at least 2 months apart. Microbiologists working with certain meningococcal bacteria, [place] recruits, people at risk during an outbreak, and people who travel to or live in countries with a high rate of meningitis should be immunized. A first-year college student up through age 34 years who is living in a residence hall should receive a dose if she did not receive a dose on or after her 16th birthday. Adults who have certain high-risk conditions should receive one or more doses of vaccine.  Hepatitis A vaccine. Adults who wish to be protected from this disease, have certain high-risk conditions, work with hepatitis A-infected animals, work in hepatitis A research labs, or travel to or work in countries with a high rate of hepatitis A should be immunized. Adults who were previously unvaccinated and who anticipate close contact with an international adoptee during the first 60 days after arrival in the [place] from a country with a high rate of hepatitis A should be immunized.  Hepatitis B vaccine. Adults who wish to be protected from this disease, have certain high-risk conditions, may be exposed to blood or other infectious body fluids, are household contacts or sex partners of hepatitis B positive people, are clients or workers in certain care facilities, or travel to or work in countries with a high rate of hepatitis B should be immunized.  Haemophilus influenzae type b (Hib) vaccine. A previously unvaccinated person with asplenia or sickle cell disease or having a scheduled splenectomy should receive 1 dose of Hib vaccine. Regardless of previous immunization, a recipient of a hematopoietic stem cell transplant should receive a  3-dose series 6-12 months after her successful transplant. Hib vaccine is not recommended for adults with HIV infection. Preventive Services / Frequency Ages 35 to 4 years  Blood pressure check.** / Every 3-5 years.  Lipid and cholesterol check.** / Every 5 years beginning at age 60.  Clinical breast exam.** / Every 3 years for women in their 71s and 10s.  BRCA-related cancer risk assessment.** / For women who have family members with a BRCA-related cancer (breast, ovarian, tubal, or peritoneal cancers).  Pap test.** / Every 2 years from ages 76 through 26. Every 3 years starting at age 61 through age 76 or 93 with a history of 3 consecutive normal Pap tests.  HPV screening.** / Every 3 years from ages 37 through ages 60 to 51 with a history of 3 consecutive normal Pap tests.  Hepatitis C blood test.** / For any individual with known risks for hepatitis C.  Skin self-exam. / Monthly.  Influenza vaccine. / Every year.  Tetanus, diphtheria, and acellular pertussis (Tdap, Td) vaccine.** / Consult your health care provider. Pregnant women should receive 1 dose of Tdap vaccine during each pregnancy. 1 dose of Td every 10 years.  Varicella vaccine.** / Consult your health care provider. Pregnant females who do not have evidence of immunity should receive the first dose after pregnancy.  HPV vaccine. / 3 doses over 6 months, if 93 and younger. The vaccine is not recommended for use in pregnant females. However, pregnancy testing is not needed before receiving a dose.  Measles, mumps, rubella (MMR) vaccine.** / You need at least 1 dose of MMR if you were born in 1957 or later. You may also need a 2nd dose. For females of childbearing age, rubella immunity should be determined. If there is no evidence of immunity, females who are not pregnant should be vaccinated. If there is no evidence of immunity, females who are  pregnant should delay immunization until after pregnancy.  Pneumococcal  13-valent conjugate (PCV13) vaccine.** / Consult your health care provider.  Pneumococcal polysaccharide (PPSV23) vaccine.** / 1 to 2 doses if you smoke cigarettes or if you have certain conditions.  Meningococcal vaccine.** / 1 dose if you are age 68 to 8 years and a Market researcher living in a residence hall, or have one of several medical conditions, you need to get vaccinated against meningococcal disease. You may also need additional booster doses.  Hepatitis A vaccine.** / Consult your health care provider.  Hepatitis B vaccine.** / Consult your health care provider.  Haemophilus influenzae type b (Hib) vaccine.** / Consult your health care provider. Ages 7 to 53 years  Blood pressure check.** / Every year.  Lipid and cholesterol check.** / Every 5 years beginning at age 25 years.  Lung cancer screening. / Every year if you are aged 11-80 years and have a 30-pack-year history of smoking and currently smoke or have quit within the past 15 years. Yearly screening is stopped once you have quit smoking for at least 15 years or develop a health problem that would prevent you from having lung cancer treatment.  Clinical breast exam.** / Every year after age 48 years.  BRCA-related cancer risk assessment.** / For women who have family members with a BRCA-related cancer (breast, ovarian, tubal, or peritoneal cancers).  Mammogram.** / Every year beginning at age 41 years and continuing for as long as you are in good health. Consult with your health care provider.  Pap test.** / Every 3 years starting at age 65 years through age 37 or 70 years with a history of 3 consecutive normal Pap tests.  HPV screening.** / Every 3 years from ages 72 years through ages 60 to 40 years with a history of 3 consecutive normal Pap tests.  Fecal occult blood test (FOBT) of stool. / Every year beginning at age 21 years and continuing until age 5 years. You may not need to do this test if you get  a colonoscopy every 10 years.  Flexible sigmoidoscopy or colonoscopy.** / Every 5 years for a flexible sigmoidoscopy or every 10 years for a colonoscopy beginning at age 35 years and continuing until age 48 years.  Hepatitis C blood test.** / For all people born from [birth year] through 1965 and any individual with known risks for hepatitis C.  Skin self-exam. / Monthly.  Influenza vaccine. / Every year.  Tetanus, diphtheria, and acellular pertussis (Tdap/Td) vaccine.** / Consult your health care provider. Pregnant women should receive 1 dose of Tdap vaccine during each pregnancy. 1 dose of Td every 10 years.  Varicella vaccine.** / Consult your health care provider. Pregnant females who do not have evidence of immunity should receive the first dose after pregnancy.  Zoster vaccine.** / 1 dose for adults aged 30 years or older.  Measles, mumps, rubella (MMR) vaccine.** / You need at least 1 dose of MMR if you were born in 1957 or later. You may also need a second dose. For females of childbearing age, rubella immunity should be determined. If there is no evidence of immunity, females who are not pregnant should be vaccinated. If there is no evidence of immunity, females who are pregnant should delay immunization until after pregnancy.  Pneumococcal 13-valent conjugate (PCV13) vaccine.** / Consult your health care provider.  Pneumococcal polysaccharide (PPSV23) vaccine.** / 1 to 2 doses if you smoke cigarettes or if you have certain conditions.  Meningococcal vaccine.** /  Consult your health care provider.  Hepatitis A vaccine.** / Consult your health care provider.  Hepatitis B vaccine.** / Consult your health care provider.  Haemophilus influenzae type b (Hib) vaccine.** / Consult your health care provider. Ages 64 years and over  Blood pressure check.** / Every year.  Lipid and cholesterol check.** / Every 5 years beginning at age 23 years.  Lung cancer screening. / Every year if you  are aged 16-80 years and have a 30-pack-year history of smoking and currently smoke or have quit within the past 15 years. Yearly screening is stopped once you have quit smoking for at least 15 years or develop a health problem that would prevent you from having lung cancer treatment.  Clinical breast exam.** / Every year after age 74 years.  BRCA-related cancer risk assessment.** / For women who have family members with a BRCA-related cancer (breast, ovarian, tubal, or peritoneal cancers).  Mammogram.** / Every year beginning at age 44 years and continuing for as long as you are in good health. Consult with your health care provider.  Pap test.** / Every 3 years starting at age 58 years through age 22 or 39 years with 3 consecutive normal Pap tests. Testing can be stopped between 65 and 70 years with 3 consecutive normal Pap tests and no abnormal Pap or HPV tests in the past 10 years.  HPV screening.** / Every 3 years from ages 64 years through ages 70 or 61 years with a history of 3 consecutive normal Pap tests. Testing can be stopped between 65 and 70 years with 3 consecutive normal Pap tests and no abnormal Pap or HPV tests in the past 10 years.  Fecal occult blood test (FOBT) of stool. / Every year beginning at age 40 years and continuing until age 27 years. You may not need to do this test if you get a colonoscopy every 10 years.  Flexible sigmoidoscopy or colonoscopy.** / Every 5 years for a flexible sigmoidoscopy or every 10 years for a colonoscopy beginning at age 7 years and continuing until age 32 years.  Hepatitis C blood test.** / For all people born from 65 through 1965 and any individual with known risks for hepatitis C.  Osteoporosis screening.** / A one-time screening for women ages 30 years and over and women at risk for fractures or osteoporosis.  Skin self-exam. / Monthly.  Influenza vaccine. / Every year.  Tetanus, diphtheria, and acellular pertussis (Tdap/Td)  vaccine.** / 1 dose of Td every 10 years.  Varicella vaccine.** / Consult your health care provider.  Zoster vaccine.** / 1 dose for adults aged 35 years or older.  Pneumococcal 13-valent conjugate (PCV13) vaccine.** / Consult your health care provider.  Pneumococcal polysaccharide (PPSV23) vaccine.** / 1 dose for all adults aged 46 years and older.  Meningococcal vaccine.** / Consult your health care provider.  Hepatitis A vaccine.** / Consult your health care provider.  Hepatitis B vaccine.** / Consult your health care provider.  Haemophilus influenzae type b (Hib) vaccine.** / Consult your health care provider. ** Family history and personal history of risk and conditions may change your health care provider's recommendations.   This information is not intended to replace advice given to you by your health care provider. Make sure you discuss any questions you have with your health care provider.   Document Released: 02/28/2001 Document Revised: 01/23/2014 Document Reviewed: 05/30/2010 Elsevier Interactive Patient Education [employer].

## 2015-11-29 ENCOUNTER — Other Ambulatory Visit: Payer: Self-pay | Admitting: Behavioral Health

## 2015-11-29 ENCOUNTER — Telehealth: Payer: Self-pay | Admitting: Behavioral Health

## 2015-11-29 NOTE — Telephone Encounter (Addendum)
-----   Message from Ann Held, DO sent at 11/27/2015  5:16 PM EST ----- Low normal vita D -- inc vita D3 to 2000u daily Rest of the labs are good  Patient made aware of lab results and provider's recommendations. She voiced understanding and requested an update with the bone density scan order, stating that she "has not received a call regarding appointment date & time". Called & spoke with Maudie Mercury at [facility] and confirmed that the order was received. She will call the patient shortly to schedule an appointment.

## 2015-11-30 LAB — HM DEXA SCAN

## 2015-12-07 ENCOUNTER — Telehealth: Payer: Self-pay

## 2015-12-07 NOTE — Telephone Encounter (Signed)
Yes---  Another option is prolia

## 2015-12-07 NOTE — Telephone Encounter (Signed)
I have spoken to patient to alert her of Dr.Lowne recommendations. Pt states she has a history of being on Reclast and states she was on it for 5 years and medication has stopped working for her. Pt also states she has been on Fosamax before and her dentist informed her that it was making her jaw deteriorate. She would like to discuss other treatment options with you. Would you like me to schedule her for a follow up appt.? TL/CMA

## 2015-12-08 NOTE — Telephone Encounter (Signed)
Called and spoke with the pt and informed her of the note below.  Pt stated that she just came in for an appt.  She said she would like to try the Prolia.  Informed the pt I will speak with Dr. Etter Sjogren and inform her that she would like to try the Prolia and to see if she can just come in and start the injection.  Pt agreed.//AB/CMA

## 2015-12-08 NOTE — Telephone Encounter (Signed)
Called and [facility] @ 10:20am @ [phone]) asking the pt to RTC regarding message below.//AB/CMA

## 2015-12-08 NOTE — Telephone Encounter (Signed)
Patient returned call

## 2015-12-13 NOTE — Telephone Encounter (Signed)
Ok to see if prolia is covered by ins

## 2015-12-15 NOTE — Telephone Encounter (Signed)
Called and [facility] @ 5:51pm @ [phone]) asking the pt to RTC regarding the note below.//AB/CMA

## 2015-12-17 NOTE — Telephone Encounter (Signed)
Patient calling back to do a follow up on Prolia injection. Plse call

## 2015-12-20 ENCOUNTER — Other Ambulatory Visit: Payer: Self-pay | Admitting: Gastroenterology

## 2015-12-23 NOTE — Telephone Encounter (Signed)
Patient returning call best  # [phone]

## 2016-01-03 NOTE — Telephone Encounter (Signed)
She had cmp in Nov--- not sure how old labs can be for injection to be given

## 2016-01-03 NOTE — Telephone Encounter (Signed)
Insurance benefits verified NO PA required and no OOP for patient as she has met her $200 deductible. Patient is scheduled for 2pm Thursday 01/06/16.  Dr. Etter Sjogren, please advise is patient needs any bloodwork prior to injection.

## 2016-01-06 ENCOUNTER — Encounter: Payer: Self-pay | Admitting: *Deleted

## 2016-01-06 ENCOUNTER — Ambulatory Visit (INDEPENDENT_AMBULATORY_CARE_PROVIDER_SITE_OTHER): Payer: Medicare Other

## 2016-01-06 DIAGNOSIS — M8000XA Age-related osteoporosis with current pathological fracture, unspecified site, initial encounter for fracture: Secondary | ICD-10-CM

## 2016-01-06 MED ORDER — DENOSUMAB 60 MG/ML ~~LOC~~ SOLN
60.0000 mg | Freq: Once | SUBCUTANEOUS | Status: AC
  Administered 2016-01-06: 60 mg via SUBCUTANEOUS

## 2016-01-06 NOTE — Progress Notes (Signed)
Pre visit review using our clinic tool,if applicable. No additional management support is needed unless otherwise documented below in the visit note.   Patient in for Prolia injection per order notes from Dr. Etter Sjogren dated 11/21 and 11/22. Given SQ Right arm. Patient tolerated well had her sit in the waiting room for 15 minutes to make sure she felt ok.

## 2016-05-05 NOTE — Progress Notes (Signed)
Pre visit review using our clinic review tool, if applicable. No additional management support is needed unless otherwise documented below in the visit note. 

## 2016-05-05 NOTE — Progress Notes (Signed)
Subjective:   Charlotte Brooks is a 71 y.o. female who presents for Medicare Annual (Subsequent) preventive examination.  Review of Systems:  No ROS.  Medicare Wellness Visit.  Cardiac Risk Factors include: advanced age (>5men, >21 women);dyslipidemia  Sleep patterns: has difficulty falling asleep, has restless sleep, has frequent nighttime awakenings and sleeps 5 hours nightly. She is not sure if she has used the Trazodone prescribed at last visit.   Home Safety/Smoke Alarms: Feels safe in home. Smoke alarms in place. Carbon monoxide detectors in place.  Living environment; residence and Firearm Safety: Lives w/ in [place] w/ husband. 2-story house, can live on one level, firearms stored safely. Seat Belt Safety/Bike Helmet: Wears seat belt.   Counseling:   Eye Exam- Follows w/ Dr. Juluis Mire office and retina specialist in [facility]. She is looking to transfer to another optometrist. Dental- Dr. Benay Pillow regularly.  Female:   Pap- Aged out.       Mammo- last 09/22/15. BI-RADS CATEGORY 1: Negative.       Dexa scan- last 11/30/15 via [facility]. Osteoporosis.       CCS- last 08/02/06. Hypotonic colon, 1 polyp removed. 10 year recall.     Objective:     Vitals: BP 118/74   Pulse 81   Ht 5\' 3"  (1.6 m)   Wt 150 lb 6.4 oz (68.2 kg)   SpO2 98%   BMI 26.64 kg/m   Body mass index is 26.64 kg/m.   Tobacco History  Smoking Status  . Never Smoker  Smokeless Tobacco  . Never Used     Counseling given: Not Answered   Past Medical History:  Diagnosis Date  . Depression   . Osteoporosis    Gets Reclast   Past Surgical History:  Procedure Laterality Date  . HERNIA REPAIR  2/08  . KNEE SURGERY     Family History  Problem Relation Age of Onset  . Stroke Mother   . Hypertension Mother   . Diabetes Brother   . Heart disease Brother   . Coronary artery disease    . Diabetes Maternal Grandmother   . Diabetes Maternal Grandfather    History  Sexual  Activity  . Sexual activity: Yes  . Partners: Male    Outpatient Encounter Prescriptions as of 05/08/2016  Medication Sig  . aspirin 81 MG chewable tablet Chew 81 mg by mouth daily.  . Biotin 5000 MCG CAPS Take 1 capsule by mouth daily.  . Cholecalciferol (VITAMIN D-3) 1000 units CAPS Take 2 capsules by mouth daily.   Marland Kitchen glucosamine-chondroitin 500-400 MG tablet Take 1 tablet by mouth daily.  Marland Kitchen ibuprofen (ADVIL,MOTRIN) 200 MG tablet Take 800 mg by mouth every 6 (six) hours as needed for fever, headache, mild pain, moderate pain or cramping.  . Multiple Minerals-Vitamins (CALCIUM-MAGNESIUM-ZINC-D3) TABS Take 1 tablet by mouth daily.  . Multiple Vitamin (MULTIVITAMIN WITH MINERALS) TABS tablet Take 1 tablet by mouth daily.  . ondansetron (ZOFRAN) 4 MG tablet Take 1 tablet (4 mg total) by mouth every 8 (eight) hours as needed for nausea or vomiting.  . polyethylene glycol powder (GLYCOLAX/MIRALAX) powder USING MEASURING CAP MIX  17GM IN WATER AND DRINK  EVERY MORNING  . vitamin C (ASCORBIC ACID) 500 MG tablet Take 500 mg by mouth daily.  . traZODone (DESYREL) 50 MG tablet Take 0.5-1 tablets (25-50 mg total) by mouth at bedtime as needed for sleep. (Patient not taking: Reported on 05/08/2016)  . [DISCONTINUED] Linaclotide (LINZESS) 290 MCG CAPS capsule  Take 1 capsule (290 mcg total) by mouth at bedtime. (Patient not taking: Reported on 11/23/2015)   No facility-administered encounter medications on file as of 05/08/2016.     Activities of Daily Living In your present state of health, do you have any difficulty performing the following activities: 05/08/2016 11/23/2015  Hearing? N N  Vision? N N  Difficulty concentrating or making decisions? N N  Walking or climbing stairs? N N  Dressing or bathing? N N  Doing errands, shopping? N N  Preparing Food and eating ? N N  Using the Toilet? N N  In the past six months, have you accidently leaked urine? N N  Do you have problems with loss of bowel  control? N N  Managing your Medications? N N  Managing your Finances? N N  Housekeeping or managing your Housekeeping? N N  Some recent data might be hidden    Patient Care Team: Ann Held, DO as PCP - General Iona Beard, MD as Referring Physician (Optometry) Mauri Pole, MD as Consulting Physician (Gastroenterology) Murlean Iba, MD as Consulting Physician (Orthopedic Surgery) Nada Libman, MD as Consulting Physician (Ophthalmology) Benay Pillow, DDS as Consulting Physician (Dentistry)    Assessment:    Physical assessment deferred to PCP.  Exercise Activities and Dietary recommendations Current Exercise Habits: Structured exercise class, Type of exercise: yoga;strength training/weights;Other - see comments (water aerobics), Frequency (Times/Week): 4, Intensity: Moderate   Diet (meal preparation, eat out, water intake, caffeinated beverages, dairy products, fruits and vegetables): in general, a "healthy" diet  , well balanced, on average, 3 meals per day. Eats mostly fish, and sometimes chicken or [place]. Does not eat red meat. Drinks lots of water-always keeps a bottle with her.    Goals    . Weight (lb) < 145 lb (65.8 kg)      Fall Risk Fall Risk  05/08/2016 11/23/2015 11/23/2015 11/23/2015 06/29/2014  Falls in the past year? Yes Yes Yes Yes No  Number falls in past yr: 1 1 1 1  -  Injury with Fall? Yes Yes Yes Yes -  Risk for fall due to : - History of fall(s) - - -   Depression Screen PHQ 2/9 Scores 05/08/2016 11/23/2015 11/23/2015 06/29/2014  PHQ - 2 Score 0 0 0 0     Cognitive Function MMSE - Mini Mental State Exam 05/08/2016 11/23/2015  Orientation to time 4 5  Orientation to Place 5 5  Registration 3 3  Attention/ Calculation 5 5  Recall 3 3  Language- name 2 objects 2 2  Language- repeat 1 1  Language- follow 3 step command 3 3  Language- read & follow direction 1 1  Write a sentence 1 1  Copy design 1 1  Total score 29 30      6CIT Screen 11/23/2015  What Year? 0 points  What month? 0 points  What time? 0 points  Count back from 20 0 points  Months in reverse 0 points  Repeat phrase 0 points  Total Score 0    Immunization History  Administered Date(s) Administered  . Zoster 05/01/2007   Screening Tests Health Maintenance  Topic Date Due  . Hepatitis C Screening  10-04-1945  . TETANUS/TDAP  11/07/2016 (Originally 06/29/1964)  . PNA vac Low Risk Adult (1 of 2 - PCV13) 11/07/2016 (Originally 06/30/2010)  . COLONOSCOPY  05/23/2016  . INFLUENZA VACCINE  08/16/2016  . MAMMOGRAM  09/21/2017  . DEXA SCAN  11/29/2017  Plan:    Follow-up w/ PCP as scheduled.  Bring a copy of your advance directives to your next office visit.  Continue to eat heart healthy diet (full of fruits, vegetables, whole grains, lean protein, water--limit salt, fat, and sugar intake) and increase physical activity as tolerated.  Consider trial of trazodone prescribed at last visit.  Declines pneumococcal vaccine.  During the course of the visit the patient was educated and counseled about the following appropriate screening and preventive services:   Vaccines to include Pneumococcal, Influenza, Td, HCV  Cardiovascular Disease  Colorectal cancer screening  Bone density screening  Diabetes screening  Glaucoma screening  Mammography  Nutrition counseling   Patient Instructions (the written plan) was given to the patient.   Dorrene German, RN  05/08/2016

## 2016-05-08 ENCOUNTER — Ambulatory Visit (INDEPENDENT_AMBULATORY_CARE_PROVIDER_SITE_OTHER): Payer: Medicare Other | Admitting: *Deleted

## 2016-05-08 ENCOUNTER — Encounter: Payer: Self-pay | Admitting: *Deleted

## 2016-05-08 VITALS — BP 118/74 | HR 81 | Ht 63.0 in | Wt 150.4 lb

## 2016-05-08 DIAGNOSIS — Z1159 Encounter for screening for other viral diseases: Secondary | ICD-10-CM

## 2016-05-08 DIAGNOSIS — Z0000 Encounter for general adult medical examination without abnormal findings: Secondary | ICD-10-CM

## 2016-05-08 NOTE — Patient Instructions (Addendum)
  Charlotte Brooks , Thank you for taking time to come for your Medicare Wellness Visit. I appreciate your ongoing commitment to your health goals. Please review the following plan we discussed and let me know if I can assist you in the future.   Bring a copy of your advance directives to your next office visit.  Continue to eat heart healthy diet (full of fruits, vegetables, whole grains, lean protein, water--limit salt, fat, and sugar intake) and increase physical activity as tolerated.  These are the goals we discussed: Goals    . Weight (lb) < 145 lb (65.8 kg)       This is a list of the screening recommended for you and due dates:  Health Maintenance  Topic Date Due  .  Hepatitis C: One time screening is recommended by Center for Disease Control  (CDC) for  adults born from [birth year] through 1965.   1945-02-17  . Tetanus Vaccine  11/07/2016*  . Pneumonia vaccines (1 of 2 - PCV13) 11/07/2016*  . Colon Cancer Screening  05/23/2016  . Flu Shot  08/16/2016  . Mammogram  09/21/2017  . DEXA scan (bone density measurement)  11/29/2017  *Topic was postponed. The date shown is not the original due date.

## 2016-06-07 ENCOUNTER — Encounter: Payer: Self-pay | Admitting: Gastroenterology

## 2016-06-28 ENCOUNTER — Telehealth: Payer: Self-pay | Admitting: Family Medicine

## 2016-06-28 NOTE — Telephone Encounter (Signed)
Prolia benefits verified NO PA required $200 ded-met 100% covered after deductible  Patient may owe approximately $0 OOP  Due after 07/04/16

## 2016-06-29 NOTE — Telephone Encounter (Signed)
Called left message to call back 

## 2016-06-30 ENCOUNTER — Ambulatory Visit: Payer: Medicare Other

## 2016-06-30 ENCOUNTER — Other Ambulatory Visit (INDEPENDENT_AMBULATORY_CARE_PROVIDER_SITE_OTHER): Payer: Medicare Other

## 2016-06-30 ENCOUNTER — Telehealth: Payer: Self-pay | Admitting: Behavioral Health

## 2016-06-30 DIAGNOSIS — Z931 Gastrostomy status: Secondary | ICD-10-CM

## 2016-06-30 DIAGNOSIS — M810 Age-related osteoporosis without current pathological fracture: Secondary | ICD-10-CM | POA: Diagnosis not present

## 2016-06-30 DIAGNOSIS — Z1159 Encounter for screening for other viral diseases: Secondary | ICD-10-CM

## 2016-06-30 LAB — BASIC METABOLIC PANEL
BUN: 19 mg/dL (ref 6–23)
CO2: 29 mEq/L (ref 19–32)
Calcium: 9.7 mg/dL (ref 8.4–10.5)
Chloride: 104 mEq/L (ref 96–112)
Creatinine, Ser: 0.92 mg/dL (ref 0.40–1.20)
GFR: 63.96 mL/min (ref >60.00)
Glucose, Bld: 52 mg/dL — ABNORMAL LOW (ref 70–99)
Potassium: 3.7 mEq/L (ref 3.5–5.1)
Sodium: 140 mEq/L (ref 135–145)

## 2016-06-30 NOTE — Telephone Encounter (Signed)
Nurse visit appointment for Prolia injection rescheduled for next Wednesday, 07/05/16 at 10:15 AM.

## 2016-06-30 NOTE — Telephone Encounter (Signed)
Called the patient informed her husband to have her call me back.

## 2016-07-01 LAB — HEPATITIS C ANTIBODY: HCV Ab: NEGATIVE

## 2016-07-03 NOTE — Telephone Encounter (Signed)
Patient had been notified already /scheduled nurse visit for 07/05/16 and have notified Gilmore Laroche to order a prolia.

## 2016-07-05 ENCOUNTER — Ambulatory Visit (INDEPENDENT_AMBULATORY_CARE_PROVIDER_SITE_OTHER): Payer: Medicare Other | Admitting: Behavioral Health

## 2016-07-05 DIAGNOSIS — M810 Age-related osteoporosis without current pathological fracture: Secondary | ICD-10-CM

## 2016-07-05 MED ORDER — DENOSUMAB 60 MG/ML ~~LOC~~ SOLN
60.0000 mg | Freq: Once | SUBCUTANEOUS | Status: AC
  Administered 2016-07-05: 60 mg via SUBCUTANEOUS

## 2016-07-05 NOTE — Progress Notes (Signed)
Pre visit review using our clinic review tool, if applicable. No additional management support is needed unless otherwise documented below in the visit note.  Patient came in clinic for Prolia injection. SQ injection given in the right arm. Patient tolerated it well. No signs or symptoms of a reaction before leaving the nurse visit.

## 2016-07-05 NOTE — Progress Notes (Signed)
Reviewed  Lylliana Kitamura R Lowne Chase, DO  

## 2016-10-23 ENCOUNTER — Encounter: Payer: Self-pay | Admitting: Family Medicine

## 2016-11-07 ENCOUNTER — Encounter: Payer: Self-pay | Admitting: Gastroenterology

## 2016-11-08 ENCOUNTER — Telehealth: Payer: Self-pay | Admitting: *Deleted

## 2016-11-08 NOTE — Telephone Encounter (Signed)
Received results from [place] via [facility]; forwarded to provider/SLS 10/24

## 2016-11-16 ENCOUNTER — Encounter (HOSPITAL_BASED_OUTPATIENT_CLINIC_OR_DEPARTMENT_OTHER): Payer: Self-pay | Admitting: *Deleted

## 2016-11-16 ENCOUNTER — Emergency Department (HOSPITAL_BASED_OUTPATIENT_CLINIC_OR_DEPARTMENT_OTHER): Payer: Medicare Other

## 2016-11-16 ENCOUNTER — Emergency Department (HOSPITAL_BASED_OUTPATIENT_CLINIC_OR_DEPARTMENT_OTHER)
Admission: EM | Admit: 2016-11-16 | Discharge: 2016-11-16 | Disposition: A | Discharge status: Home / Self Care | Payer: Medicare Other | Attending: Emergency Medicine | Admitting: Emergency Medicine

## 2016-11-16 DIAGNOSIS — Y999 Unspecified external cause status: Secondary | ICD-10-CM | POA: Insufficient documentation

## 2016-11-16 DIAGNOSIS — Y9301 Activity, walking, marching and hiking: Secondary | ICD-10-CM | POA: Insufficient documentation

## 2016-11-16 DIAGNOSIS — Z79899 Other long term (current) drug therapy: Secondary | ICD-10-CM | POA: Diagnosis not present

## 2016-11-16 DIAGNOSIS — Y929 Unspecified place or not applicable: Secondary | ICD-10-CM | POA: Insufficient documentation

## 2016-11-16 DIAGNOSIS — W101XXA Fall (on)(from) sidewalk curb, initial encounter: Secondary | ICD-10-CM | POA: Insufficient documentation

## 2016-11-16 DIAGNOSIS — Z7982 Long term (current) use of aspirin: Secondary | ICD-10-CM | POA: Diagnosis not present

## 2016-11-16 DIAGNOSIS — S82001A Unspecified fracture of right patella, initial encounter for closed fracture: Secondary | ICD-10-CM | POA: Diagnosis not present

## 2016-11-16 DIAGNOSIS — S8991XA Unspecified injury of right lower leg, initial encounter: Secondary | ICD-10-CM | POA: Diagnosis present

## 2016-11-16 NOTE — ED Provider Notes (Signed)
[facility] EMERGENCY DEPARTMENT Provider Note   CSN: [id number] Arrival date & time: 11/16/16  1453     History   Chief Complaint Chief Complaint  Patient presents with  . Fall  . Knee Pain    HPI Charlotte Brooks is a 71 y.o. female presenting with right knee pain.  Patient states she was walking to her car when she tripped over the curb and landed on the front of her knees yesterday.  At that time, she had acute onset right knee pain.  Since then, she states that she is unable to walk due to the pain.  She has been using crutches to get around her house.  She has been trying to tough it out, but was concerned that there is something wrong.  She denies injury or pain elsewhere.  She denies numbness, tingling, pain in her foot, hip, or back.  She is not on blood thinners.  She took ibuprofen last night with mild relief of pain.  Weightbearing and movement makes the pain worse.  She sees Dr. Wynelle Link with orthopedics.  HPI  Past Medical History:  Diagnosis Date  . Depression   . Osteoporosis    Gets Reclast    Patient Active Problem List   Diagnosis Date Noted  . 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 DEPRESSION 05/31/2006    Past Surgical History:  Procedure Laterality Date  . HERNIA REPAIR  2/08  . KNEE SURGERY      OB History    No data available       Home Medications    Prior to Admission medications   Medication Sig Start Date End Date Taking? Authorizing Provider  aspirin 81 MG chewable tablet Chew 81 mg by mouth daily.    [provider]  Biotin 5000 MCG CAPS Take 1 capsule by mouth daily.    [provider]  Cholecalciferol (VITAMIN D-3) 1000 units CAPS Take 2 capsules by mouth daily.     [provider]  glucosamine-chondroitin 500-400 MG tablet Take 1 tablet by mouth daily.    [provider]  ibuprofen (ADVIL,MOTRIN) 200 MG tablet Take 800  mg by mouth every 6 (six) hours as needed for fever, headache, mild pain, moderate pain or cramping.    [provider]  Multiple Minerals-Vitamins (CALCIUM-MAGNESIUM-ZINC-D3) TABS Take 1 tablet by mouth daily.    [provider]  Multiple Vitamin (MULTIVITAMIN WITH MINERALS) TABS tablet Take 1 tablet by mouth daily.    [provider]  ondansetron (ZOFRAN) 4 MG tablet Take 1 tablet (4 mg total) by mouth every 8 (eight) hours as needed for nausea or vomiting. 08/02/15   Pisciotta, Elmyra Ricks, PA-C  polyethylene glycol powder (GLYCOLAX/MIRALAX) powder USING MEASURING CAP MIX  17GM IN WATER AND DRINK  EVERY MORNING 12/20/15   Nandigam, Venia Minks, MD  traZODone (DESYREL) 50 MG tablet Take 0.5-1 tablets (25-50 mg total) by mouth at bedtime as needed for sleep. Patient not taking: Reported on 05/08/2016 11/23/15   Roma Schanz R, DO  vitamin C (ASCORBIC ACID) 500 MG tablet Take 500 mg by mouth daily.    [provider]    Family History Family History  Problem Relation Age of Onset  . Stroke Mother   . Hypertension Mother   . Diabetes Brother   . Heart disease Brother   . Coronary artery disease Unknown   . Diabetes Maternal Grandmother   . Diabetes  Maternal Grandfather     Social History Social History  Substance Use Topics  . Smoking status: Never Smoker  . Smokeless tobacco: Never Used  . Alcohol use Yes     Comment: Occ     Allergies   Penicillins; Codeine; and Sulfonamide derivatives   Review of Systems Review of Systems  Musculoskeletal: Positive for arthralgias and gait problem.  Neurological: Negative for numbness.  Hematological: Does not bruise/bleed easily.     Physical Exam Updated Vital Signs BP (!) 118/57   Pulse 68   Temp 97.7 F (36.5 C) (Oral)   Resp 18   Ht 5\' 6"  (1.676 m)   Wt 68 kg (150 lb)   SpO2 100%   BMI 24.21 kg/m   Physical Exam  Constitutional: She is oriented to person, place, and time. She appears  well-developed and well-nourished. No distress.  HENT:  Head: Normocephalic and atraumatic.  Eyes: EOM are normal.  Neck: Normal range of motion.  Pulmonary/Chest: Effort normal.  Abdominal: She exhibits no distension.  Musculoskeletal: She exhibits tenderness.  Tenderness to palpation of the anterior R knee, worse over the patellar tendon.  Minimal swelling.  No obvious deformity, contusion, or laceration.  Patient able to bend and straighten knee with pain.  Pain with passive range of motion with full extension and increased flexion.  Pedal pulses intact bilaterally.  Strength intact bilaterally.  Sensation intact bilaterally.  Soft compartments.  Neurological: She is alert and oriented to person, place, and time.  Skin: Skin is warm. No rash noted.  Psychiatric: She has a normal mood and affect.  Nursing note and vitals reviewed.    ED Treatments / Results  Labs (all labs ordered are listed, but only abnormal results are displayed) Labs Reviewed - No data to display  EKG  EKG Interpretation None       Radiology Dg Knee Complete 4 Views Right  Result Date: 11/16/2016 CLINICAL DATA:  71 y/o F; right knee injury with pain, initial encounter. EXAM: RIGHT KNEE - COMPLETE 4+ VIEW COMPARISON:  08/02/2015 right knee radiograph FINDINGS: Lucency traversing inferior patella likely representing nondisplaced fracture. Suprapatellar joint effusion. Joint spaces are well maintained. No other fracture or dislocation identified. IMPRESSION: Lucency traversing inferior patella likely representing nondisplaced fracture. Suprapatellar joint effusion. Electronically Signed   By: Kristine Garbe M.D.   On: 11/16/2016 16:19    Procedures Procedures (including critical care time)  Medications Ordered in ED Medications - No data to display   Initial Impression / Assessment and Plan / ED Course  I have reviewed the triage vital signs and the nursing notes.  Pertinent labs & imaging  results that were available during my care of the patient were reviewed by me and considered in my medical decision making (see chart for details).     Patient presenting with right knee pain beginning yesterday after a fall.  Physical exam shows tenderness to palpation of the anterior right knee.  No obvious deformity.  X-ray shows nondisplaced mild patellar fracture.  Will place patient in knee immobilizer and have her follow-up with orthopedics.  Conservative treatment for pain including Tylenol, ibuprofen, and ice.  Case discussed with attending, Dr. Maryan Rued evaluated the patient.  At this time, patient appears safe for discharge.  Return precautions given.  Patient states she understands and agrees to plan.   Final Clinical Impressions(s) / ED Diagnoses   Final diagnoses:  Closed nondisplaced fracture of right patella, unspecified fracture morphology, initial encounter    New  Prescriptions Discharge Medication List as of 11/16/2016  4:44 PM       Franchot Heidelberg, PA-C 11/16/16 1728    Blanchie Dessert, MD 11/16/16 2303

## 2016-11-16 NOTE — Discharge Instructions (Signed)
Wear the knee immobilizer when walking.  You may wear this at night if you feel that it helps, but it is not required If you need to, you may use crutches to help with pain. Use Tylenol or ibuprofen as needed for pain. Ice your knee multiple times throughout the day, 20 minutes at a time. Follow-up with your orthopedic doctor for evaluation of your knee. Return to the emergency department if you develop numbness of your foot, color change of your foot, or significantly worsening pain.

## 2016-11-16 NOTE — ED Triage Notes (Signed)
She tripped and fell yesterday am. Pain in her right knee. She was on her way to get the nerves in her lower back burned for pain relief and ignored the knee injury. This am the knee and lower leg pain was worse.

## 2016-12-01 ENCOUNTER — Encounter: Payer: Self-pay | Admitting: Family Medicine

## 2016-12-01 ENCOUNTER — Ambulatory Visit (INDEPENDENT_AMBULATORY_CARE_PROVIDER_SITE_OTHER): Payer: Medicare Other | Admitting: Family Medicine

## 2016-12-01 VITALS — BP 108/70 | HR 59 | Temp 97.9°F | Ht 66.25 in | Wt 156.0 lb

## 2016-12-01 DIAGNOSIS — Z0000 Encounter for general adult medical examination without abnormal findings: Secondary | ICD-10-CM | POA: Diagnosis not present

## 2016-12-01 DIAGNOSIS — R5383 Other fatigue: Secondary | ICD-10-CM

## 2016-12-01 LAB — CBC WITH DIFFERENTIAL/PLATELET
Basophils Absolute: 0.2 10*3/uL — ABNORMAL HIGH (ref 0.0–0.1)
Basophils Relative: 2.7 % (ref 0.0–3.0)
Eosinophils Absolute: 0.6 10*3/uL (ref 0.0–0.7)
Eosinophils Relative: 10 % — ABNORMAL HIGH (ref 0.0–5.0)
HCT: 42.3 % (ref 36.0–46.0)
Hemoglobin: 13.8 g/dL (ref 12.0–15.0)
Lymphocytes Relative: 31.7 % (ref 12.0–46.0)
Lymphs Abs: 2 10*3/uL (ref 0.7–4.0)
MCHC: 32.6 g/dL (ref 30.0–36.0)
MCV: 92.6 fl (ref 78.0–100.0)
Monocytes Absolute: 0.4 10*3/uL (ref 0.1–1.0)
Monocytes Relative: 7 % (ref 3.0–12.0)
Neutro Abs: 3 10*3/uL (ref 1.4–7.7)
Neutrophils Relative %: 48.6 % (ref 43.0–77.0)
Platelets: 226 10*3/uL (ref 150.0–400.0)
RBC: 4.56 Mil/uL (ref 3.87–5.11)
RDW: 13.2 % (ref 11.5–15.5)
WBC: 6.2 10*3/uL (ref 4.0–10.5)

## 2016-12-01 LAB — LIPID PANEL
Cholesterol: 185 mg/dL (ref 0–200)
HDL: 47.3 mg/dL (ref >39.00)
LDL Cholesterol: 112 mg/dL — ABNORMAL HIGH (ref 0–99)
NonHDL: 137.74
Total CHOL/HDL Ratio: 4
Triglycerides: 127 mg/dL (ref 0.0–149.0)
VLDL: 25.4 mg/dL (ref 0.0–40.0)

## 2016-12-01 LAB — COMPREHENSIVE METABOLIC PANEL
ALT: 14 U/L (ref 0–35)
AST: 21 U/L (ref 0–37)
Albumin: 4.3 g/dL (ref 3.5–5.2)
Alkaline Phosphatase: 91 U/L (ref 39–117)
BUN: 13 mg/dL (ref 6–23)
CO2: 29 mEq/L (ref 19–32)
Calcium: 10.5 mg/dL (ref 8.4–10.5)
Chloride: 105 mEq/L (ref 96–112)
Creatinine, Ser: 0.89 mg/dL (ref 0.40–1.20)
GFR: 66.37 mL/min (ref >60.00)
Glucose, Bld: 104 mg/dL — ABNORMAL HIGH (ref 70–99)
Potassium: 5.5 mEq/L — ABNORMAL HIGH (ref 3.5–5.1)
Sodium: 144 mEq/L (ref 135–145)
Total Bilirubin: 0.6 mg/dL (ref 0.2–1.2)
Total Protein: 8.1 g/dL (ref 6.0–8.3)

## 2016-12-01 LAB — TSH: TSH: 2.01 u[IU]/mL (ref 0.35–4.50)

## 2016-12-01 NOTE — Patient Instructions (Signed)
Preventive Care 65 Years and Older, Female Preventive care refers to lifestyle choices and visits with your health care provider that can promote health and wellness. What does preventive care include?  A yearly physical exam. This is also called an annual well check.  Dental exams once or twice a year.  Routine eye exams. Ask your health care provider how often you should have your eyes checked.  Personal lifestyle choices, including: ? Daily care of your teeth and gums. ? Regular physical activity. ? Eating a healthy diet. ? Avoiding tobacco and drug use. ? Limiting alcohol use. ? Practicing safe sex. ? Taking low-dose aspirin every day. ? Taking vitamin and mineral supplements as recommended by your health care provider. What happens during an annual well check? The services and screenings done by your health care provider during your annual well check will depend on your age, overall health, lifestyle risk factors, and family history of disease. Counseling Your health care provider may ask you questions about your:  Alcohol use.  Tobacco use.  Drug use.  Emotional well-being.  Home and relationship well-being.  Sexual activity.  Eating habits.  History of falls.  Memory and ability to understand (cognition).  Work and work environment.  Reproductive health.  Screening You may have the following tests or measurements:  Height, weight, and BMI.  Blood pressure.  Lipid and cholesterol levels. These may be checked every 5 years, or more frequently if you are over 50 years old.  Skin check.  Lung cancer screening. You may have this screening every year starting at age 55 if you have a 30-pack-year history of smoking and currently smoke or have quit within the past 15 years.  Fecal occult blood test (FOBT) of the stool. You may have this test every year starting at age 50.  Flexible sigmoidoscopy or colonoscopy. You may have a sigmoidoscopy every 5 years or  a colonoscopy every 10 years starting at age 50.  Hepatitis C blood test.  Hepatitis B blood test.  Sexually transmitted disease (STD) testing.  Diabetes screening. This is done by checking your blood sugar (glucose) after you have not eaten for a while (fasting). You may have this done every 1-3 years.  Bone density scan. This is done to screen for osteoporosis. You may have this done starting at age 65.  Mammogram. This may be done every 1-2 years. Talk to your health care provider about how often you should have regular mammograms.  Talk with your health care provider about your test results, treatment options, and if necessary, the need for more tests. Vaccines Your health care provider may recommend certain vaccines, such as:  Influenza vaccine. This is recommended every year.  Tetanus, diphtheria, and acellular pertussis (Tdap, Td) vaccine. You may need a Td booster every 10 years.  Varicella vaccine. You may need this if you have not been vaccinated.  Zoster vaccine. You may need this after age 60.  Measles, mumps, and rubella (MMR) vaccine. You may need at least one dose of MMR if you were born in 1957 or later. You may also need a second dose.  Pneumococcal 13-valent conjugate (PCV13) vaccine. One dose is recommended after age 65.  Pneumococcal polysaccharide (PPSV23) vaccine. One dose is recommended after age 65.  Meningococcal vaccine. You may need this if you have certain conditions.  Hepatitis A vaccine. You may need this if you have certain conditions or if you travel or work in places where you may be exposed to hepatitis   A.  Hepatitis B vaccine. You may need this if you have certain conditions or if you travel or work in places where you may be exposed to hepatitis B.  Haemophilus influenzae type b (Hib) vaccine. You may need this if you have certain conditions.  Talk to your health care provider about which screenings and vaccines you need and how often you  need them. This information is not intended to replace advice given to you by your health care provider. Make sure you discuss any questions you have with your health care provider. Document Released: 01/29/2015 Document Revised: 09/22/2015 Document Reviewed: 11/03/2014 Elsevier Interactive Patient Education  2017 [employer].

## 2016-12-01 NOTE — Progress Notes (Signed)
Subjective:     Charlotte Brooks is a 71 y.o. female and is here for a comprehensive physical exam. The patient reports problems - extreme exhaustion -- she is normally very active -- works out Set designer day.  .  Social History   Socioeconomic History  . Marital status: Married    Spouse name: Not on file  . Number of children: Not on file  . Years of education: Not on file  . Highest education level: Not on file  Social Needs  . Financial resource strain: Not on file  . Food insecurity - worry: Not on file  . Food insecurity - inability: Not on file  . Transportation needs - medical: Not on file  . Transportation needs - non-medical: Not on file  Occupational History  . Not on file  Tobacco Use  . Smoking status: Never Smoker  . Smokeless tobacco: Never Used  Substance and Sexual Activity  . Alcohol use: Yes    Comment: Occ  . Drug use: No  . Sexual activity: Yes    Partners: Male  Other Topics Concern  . Not on file  Social History Narrative   Exercise-- water aerobics, yoga, machines   Health Maintenance  Topic Date Due  . TETANUS/TDAP  06/29/1964  . PNA vac Low Risk Adult (1 of 2 - PCV13) 06/30/2010  . COLONOSCOPY  05/23/2016  . INFLUENZA VACCINE  04/26/2017 (Originally 08/16/2016)  . MAMMOGRAM  09/21/2017  . DEXA SCAN  11/29/2017  . Hepatitis C Screening  Completed    The following portions of the patient's history were reviewed and updated as appropriate:  She  has a past medical history of Depression and Osteoporosis. She does not have any pertinent problems on file. She  has a past surgical history that includes Hernia repair (2/08) and Knee surgery. Her family history includes Coronary artery disease in her unknown relative; Diabetes in her brother, maternal grandfather, and maternal grandmother; Heart disease in her brother; Hypertension in her mother; Stroke in her mother. She  reports that  has never smoked. she has never used smokeless tobacco. She reports  that she drinks alcohol. She reports that she does not use drugs. She has a current medication list which includes the following prescription(s): aspirin, biotin, vitamin d-3, glucosamine-chondroitin, ibuprofen, calcium-magnesium-zinc-d3, polyethylene glycol powder, and vitamin c. Current Outpatient Medications on File Prior to Visit  Medication Sig Dispense Refill  . aspirin 81 MG chewable tablet Chew 81 mg by mouth daily.    . Biotin 5000 MCG CAPS Take 1 capsule by mouth daily.    . Cholecalciferol (VITAMIN D-3) 1000 units CAPS Take 2 capsules by mouth daily.     Marland Kitchen glucosamine-chondroitin 500-400 MG tablet Take 1 tablet by mouth daily.    Marland Kitchen ibuprofen (ADVIL,MOTRIN) 200 MG tablet Take 800 mg by mouth every 6 (six) hours as needed for fever, headache, mild pain, moderate pain or cramping.    . Multiple Minerals-Vitamins (CALCIUM-MAGNESIUM-ZINC-D3) TABS Take 1 tablet by mouth daily.    . polyethylene glycol powder (GLYCOLAX/MIRALAX) powder USING MEASURING CAP MIX  17GM IN WATER AND DRINK  EVERY MORNING 1581 g 3  . vitamin C (ASCORBIC ACID) 500 MG tablet Take 500 mg by mouth daily.     No current facility-administered medications on file prior to visit.    She is allergic to penicillins; codeine; and sulfonamide derivatives..  Review of Systems Review of Systems  Constitutional: Negative for activity change, appetite change and fatigue.  HENT: Negative for  hearing loss, congestion, tinnitus and ear discharge.  dentist q54m Eyes: Negative for visual disturbance (see optho q1y -- vision corrected to 20/20 with glasses).  Respiratory: Negative for cough, chest tightness and shortness of breath.   Cardiovascular: Negative for chest pain, palpitations and leg swelling.  Gastrointestinal: Negative for abdominal pain, diarrhea,  and abdominal distention. + constipation Genitourinary: Negative for urgency, frequency, decreased urine volume and difficulty urinating.  Musculoskeletal: Negative for back  pain,  R knee pain Skin: Negative for color change, pallor and rash.  Neurological: Negative for dizziness, light-headedness, numbness and headaches.  Hematological: Negative for adenopathy. Does not bruise/bleed easily.  Psychiatric/Behavioral: Negative for suicidal ideas, confusion, sleep disturbance, self-injury, dysphoric mood, decreased concentration and agitation.       Objective:    BP 108/70 (BP Location: Right Arm, Patient Position: Sitting, Cuff Size: Normal)   Pulse (!) 59   Temp 97.9 F (36.6 C) (Oral)   Ht 5' 6.25" (1.683 m)   Wt 156 lb (70.8 kg)   SpO2 97%   BMI 24.99 kg/m  General appearance: alert, cooperative, appears stated age and no distress Head: Normocephalic, without obvious abnormality, atraumatic Eyes: conjunctivae/corneas clear. PERRL, EOM's intact. Fundi benign. Ears: normal TM's and external ear canals both ears Nose: Nares normal. Septum midline. Mucosa normal. No drainage or sinus tenderness. Throat: lips, mucosa, and tongue normal; teeth and gums normal Neck: no adenopathy, no carotid bruit, no JVD, supple, symmetrical, trachea midline and thyroid not enlarged, symmetric, no tenderness/mass/nodules Back: symmetric, no curvature. ROM normal. No CVA tenderness. Lungs: clear to auscultation bilaterally Breasts: gyn Heart: regular rate and rhythm, S1, S2 normal, no murmur, click, rub or gallop Abdomen: soft, non-tender; bowel sounds normal; no masses,  no organomegaly Pelvic: not indicated; post-menopausal, no abnormal Pap smears in past Extremities: r knee brace on from patella fracture Pulses: 2+ and symmetric Skin: Skin color, texture, turgor normal. No rashes or lesions Lymph nodes: Cervical, supraclavicular, and axillary nodes normal. Neurologic: Alert and oriented X 3, normal strength and tone. Normal symmetric reflexes. Normal coordination and gait    Assessment:    Healthy female exam.      Plan:    ghm utd Check labs See After Visit  Summary for Counseling Recommendations    1. Preventative health care See above  2. Fatigue, unspecified type Check labs  - Lipid panel - CBC with Differential/Platelet - Comprehensive metabolic panel - TSH - POCT Urinalysis Dipstick (Automated)

## 2016-12-11 ENCOUNTER — Telehealth: Payer: Self-pay | Admitting: *Deleted

## 2016-12-11 DIAGNOSIS — E875 Hyperkalemia: Secondary | ICD-10-CM

## 2016-12-11 NOTE — Telephone Encounter (Signed)
See my chart message

## 2016-12-15 ENCOUNTER — Other Ambulatory Visit: Payer: Self-pay

## 2016-12-15 ENCOUNTER — Ambulatory Visit (AMBULATORY_SURGERY_CENTER): Payer: Self-pay

## 2016-12-15 ENCOUNTER — Encounter: Payer: Self-pay | Admitting: Gastroenterology

## 2016-12-15 VITALS — Ht 66.0 in | Wt 150.0 lb

## 2016-12-15 DIAGNOSIS — Z86010 Personal history of colonic polyps: Secondary | ICD-10-CM

## 2016-12-15 MED ORDER — NA SULFATE-K SULFATE-MG SULF 17.5-3.13-1.6 GM/177ML PO SOLN: 1.0000 | Freq: Once | ORAL | 0 refills | Status: AC

## 2016-12-15 NOTE — Progress Notes (Signed)
Denies allergies to eggs or soy products. Denies complication of anesthesia or sedation. Denies use of weight loss medication. Denies use of O2.   Emmi instructions declined.  

## 2016-12-20 ENCOUNTER — Telehealth: Payer: Self-pay | Admitting: Family Medicine

## 2016-12-20 NOTE — Telephone Encounter (Signed)
P[employer] benefits received PA NOT required Deductible has been met   Patient may owe approximately $37 OOP  Patient due after 01/01/17  Letter mailed to inform patient of benefits and to schedule

## 2016-12-29 ENCOUNTER — Encounter: Payer: Self-pay | Admitting: Gastroenterology

## 2016-12-29 ENCOUNTER — Other Ambulatory Visit: Payer: Self-pay

## 2016-12-29 ENCOUNTER — Ambulatory Visit (AMBULATORY_SURGERY_CENTER): Payer: Medicare Other | Admitting: Gastroenterology

## 2016-12-29 VITALS — BP 122/66 | HR 62 | Temp 98.6°F | Resp 11 | Ht 66.25 in | Wt 156.0 lb

## 2016-12-29 DIAGNOSIS — D120 Benign neoplasm of cecum: Secondary | ICD-10-CM | POA: Diagnosis not present

## 2016-12-29 DIAGNOSIS — Z86010 Personal history of colonic polyps: Secondary | ICD-10-CM

## 2016-12-29 DIAGNOSIS — D122 Benign neoplasm of ascending colon: Secondary | ICD-10-CM | POA: Diagnosis not present

## 2016-12-29 MED ORDER — SODIUM CHLORIDE 0.9 % IV SOLN: 500.0000 mL | INTRAVENOUS | Status: DC

## 2016-12-29 NOTE — Progress Notes (Signed)
Called to room to assist during endoscopic procedure.  Patient ID and intended procedure confirmed with present staff. Received instructions for my participation in the procedure from the performing physician.  

## 2016-12-29 NOTE — Patient Instructions (Addendum)
YOU HAD AN ENDOSCOPIC PROCEDURE TODAY AT [facility]:   Refer to the procedure report that was given to you for any specific questions about what was found during the examination.  If the procedure report does not answer your questions, please call your gastroenterologist to clarify.  If you requested that your care partner not be given the details of your procedure findings, then the procedure report has been included in a sealed envelope for you to review at your convenience later.  YOU SHOULD EXPECT: Some feelings of bloating in the abdomen. Passage of more gas than usual.  Walking can help get rid of the air that was put into your GI tract during the procedure and reduce the bloating. If you had a lower endoscopy (such as a colonoscopy or flexible sigmoidoscopy) you may notice spotting of blood in your stool or on the toilet paper. If you underwent a bowel prep for your procedure, you may not have a normal bowel movement for a few days.  Please Note:  You might notice some irritation and congestion in your nose or some drainage.  This is from the oxygen used during your procedure.  There is no need for concern and it should clear up in a day or so.  SYMPTOMS TO REPORT IMMEDIATELY:   Following lower endoscopy (colonoscopy or flexible sigmoidoscopy):  Excessive amounts of blood in the stool  Significant tenderness or worsening of abdominal pains  Swelling of the abdomen that is new, acute  Fever of 100F or higher  For urgent or emergent issues, a gastroenterologist can be reached at any hour by calling [phone].   DIET:  We do recommend a small meal at first, but then you may proceed to your regular diet.  Drink plenty of fluids but you should avoid alcoholic beverages for 24 hours.  ACTIVITY:  You should plan to take it easy for the rest of today and you should NOT DRIVE or use heavy machinery until tomorrow (because of the sedation medicines used during the test).     FOLLOW UP: Our staff will call the number listed on your records the next business day following your procedure to check on you and address any questions or concerns that you may have regarding the information given to you following your procedure. If we do not reach you, we will leave a message.  However, if you are feeling well and you are not experiencing any problems, there is no need to return our call.  We will assume that you have returned to your regular daily activities without incident.  If any biopsies were taken you will be contacted by phone or by letter within the next 1-3 weeks.  Please call us at [phone] if you have not heard about the biopsies in 3 weeks.   Await for biopsy results  Repeat Colonoscopy screening in a year Polyps (handout given) Diverticulosis (handout given) Hemorrhoids (handout given) High Fiber Diet (handout given) .Marland KitchenNo ibuprofen, naproxen or other non-steroidal anti-inflammatory drugs for 2 weeks after polyp removal. Tylenol okay if needed.  SIGNATURES/CONFIDENTIALITY: You and/or your care partner have signed paperwork which will be entered into your electronic medical record.  These signatures attest to the fact that that the information above on your After Visit Summary has been reviewed and is understood.  Full responsibility of the confidentiality of this discharge information lies with you and/or your care-partner.

## 2016-12-29 NOTE — Op Note (Signed)
[place] Patient Name: Charlotte Brooks Procedure Date: 12/29/2016 10:57 AM MRN: [id number] Endoscopist: Mauri Pole , MD Age: 71 Referring MD:  Date of Birth: 1945/03/01 Gender: Female Account #: [id number] Procedure:                Colonoscopy Indications:              Screening for colorectal malignant neoplasm, Last                            colonoscopy: 2008 Medicines:                Monitored Anesthesia Care Procedure:                Pre-Anesthesia Assessment:                           - Prior to the procedure, a History and Physical                            was performed, and patient medications and                            allergies were reviewed. The patient's tolerance of                            previous anesthesia was also reviewed. The risks                            and benefits of the procedure and the sedation                            options and risks were discussed with the patient.                            All questions were answered, and informed consent                            was obtained. Prior Anticoagulants: The patient has                            taken no previous anticoagulant or antiplatelet                            agents. ASA Grade Assessment: II - A patient with                            mild systemic disease. After reviewing the risks                            and benefits, the patient was deemed in                            satisfactory condition to undergo the procedure.  After obtaining informed consent, the colonoscope                            was passed under direct vision. Throughout the                            procedure, the patient's blood pressure, pulse, and                            oxygen saturations were monitored continuously. The                            Colonoscope was introduced through the anus and                            advanced to the the terminal ileum,  with                            identification of the appendiceal orifice and IC                            valve. The colonoscopy was performed without                            difficulty. The patient tolerated the procedure                            well. The quality of the bowel preparation was                            fair. The terminal ileum, ileocecal valve,                            appendiceal orifice, and rectum were photographed. Scope In: 16:07:37 AM Scope Out: 11:31:37 AM Scope Withdrawal Time: 0 hours 20 minutes 59 seconds  Total Procedure Duration: 0 hours 25 minutes 29 seconds  Findings:                 The perianal and digital rectal examinations were                            normal.                           A 20 mm polyp was found in the cecum. The polyp was                            multi-lobulated. The polyp was removed with a                            piecemeal technique using a cold snare. Resection                            and retrieval were complete.  Four carpet-like polyps were found in the ascending                            colon and cecum. The polyps were 9 to 18 mm in                            size. These polyps were removed with a piecemeal                            technique using a cold snare. Resection and                            retrieval were complete.                           A 3 mm polyp was found in the cecum. The polyp was                            sessile. The polyp was removed with a cold biopsy                            forceps. Resection and retrieval were complete.                           A patchy area of severe melanosis was found in the                            rectum, in the sigmoid colon, in the descending                            colon, in the transverse colon, in the ascending                            colon and in the cecum.                           Multiple small-mouthed diverticula  were found in                            the sigmoid colon and descending colon.                           Non-bleeding internal hemorrhoids were found during                            retroflexion. The hemorrhoids were small.                           The exam was otherwise without abnormality. Complications:            No immediate complications. Estimated Blood Loss:     Estimated blood loss was minimal. Impression:               - Preparation of the colon was  fair.                           - One 20 mm polyp in the cecum, removed piecemeal                            using a cold snare. Resected and retrieved.                           - Four 9 to 18 mm polyps in the ascending colon and                            in the cecum, removed piecemeal using a cold snare.                            Resected and retrieved.                           - One 3 mm polyp in the cecum, removed with a cold                            biopsy forceps. Resected and retrieved.                           - Melanosis in the colon.                           - Diverticulosis in the sigmoid colon and in the                            descending colon.                           - Non-bleeding internal hemorrhoids.                           - The examination was otherwise normal. Recommendation:           - Patient has a contact number available for                            emergencies. The signs and symptoms of potential                            delayed complications were discussed with the                            patient. Return to normal activities tomorrow.                            Written discharge instructions were provided to the                            patient.                           -  Resume previous diet.                           - Continue present medications.                           - Await pathology results.                           - Repeat colonoscopy in 1 year for surveillance                             after piecemeal polypectomy and for surveillance                            based on pathology results. Mauri Pole, MD 12/29/2016 11:37:45 AM This report has been signed electronically.

## 2016-12-29 NOTE — Progress Notes (Signed)
Pt. Reports no change in her medical or surgical history since her pre-visit 12/15/2016.

## 2016-12-29 NOTE — Progress Notes (Signed)
Report to PACU, RN, vss, BBS= Clear.  

## 2017-01-01 ENCOUNTER — Telehealth: Payer: Self-pay

## 2017-01-01 ENCOUNTER — Telehealth: Payer: Self-pay | Admitting: *Deleted

## 2017-01-01 NOTE — Telephone Encounter (Addendum)
Patient in today for Prolia injection per order from Dr. Carollee Herter  Due to patient having Senile Osteoporosis.  Given 60 mg SQ right arm patient tolerated well.  Reminder card given for 6 month return.

## 2017-01-01 NOTE — Telephone Encounter (Signed)
  Follow up Call-  Call back number 12/29/2016  Post procedure Call Back phone  # [phone]  Permission to leave phone message Yes  Some recent data might be hidden     Patient questions:  Do you have a fever, pain , or abdominal swelling? No. Pain Score  0 *  Have you tolerated food without any problems? Yes.    Have you been able to return to your normal activities? Yes.    Do you have any questions about your discharge instructions: Diet   No. Medications  No. Follow up visit  No.  Do you have questions or concerns about your Care? No.  Actions: * If pain score is 4 or above: No action needed, pain <4.

## 2017-01-02 ENCOUNTER — Ambulatory Visit: Payer: Medicare Other

## 2017-01-05 ENCOUNTER — Telehealth: Payer: Self-pay | Admitting: Gastroenterology

## 2017-01-05 DIAGNOSIS — M810 Age-related osteoporosis without current pathological fracture: Secondary | ICD-10-CM

## 2017-01-05 NOTE — Telephone Encounter (Signed)
Spoke with pts huband and he is aware. Recall in epic.

## 2017-01-05 NOTE — Telephone Encounter (Signed)
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 Repeat colonoscopy in 1 year for surveillance. Please inform patient the results. Thanks

## 2017-01-05 NOTE — Telephone Encounter (Signed)
Patient calling requesting path results from Colonoscopy. Please advise.

## 2017-01-11 ENCOUNTER — Encounter: Payer: Self-pay | Admitting: Gastroenterology

## 2017-01-19 ENCOUNTER — Other Ambulatory Visit: Payer: Self-pay | Admitting: Gastroenterology

## 2017-01-26 MED ORDER — DENOSUMAB 60 MG/ML ~~LOC~~ SOLN: 60.0000 mg | SUBCUTANEOUS | 0 refills | Status: DC

## 2017-01-26 NOTE — Addendum Note (Signed)
Addended by: Vernie Shanks E on: 01/26/2017 11:05 AM   Modules accepted: Orders

## 2017-05-09 ENCOUNTER — Ambulatory Visit: Payer: Medicare Other | Admitting: *Deleted

## 2017-05-10 ENCOUNTER — Ambulatory Visit: Payer: Medicare Other | Admitting: *Deleted

## 2017-05-11 NOTE — Addendum Note (Signed)
Addended by: Bartholome Bill on: 05/11/2017 10:53 AM   Modules accepted: Orders

## 2017-06-14 NOTE — Progress Notes (Signed)
Office Visit Note  Patient: Charlotte Brooks             Date of Birth: [birth year]           MRN: [id number]             PCP: Ann Held, DO Referring: Ann Held, * Visit Date: 06/28/2017 Occupation: Retired from [employer]    Subjective:  Lower back pain.   History of Present Illness: Charlotte Brooks is a 72 y.o. female with history of DDD and osteoporosis.  According to patient she started having lower back pain about 5 to 6 years ago.  At the time she was seen by [facility] orthoped[facility].  She recalls having x-rays and MRI and was diagnosed with disc disease of lumbar spine.  The lower back pain persist.  She started seeing a spine and a scoliosis specialist.  She was under the care for 1 year.  She recalls having injections and even nerve ablation without much relief.  She underwent physical therapy which did not help much.  She is very active she works out on a daily basis she does pool exercises, chair yoga and strengthening exercises.  She continues to have lower back discomfort she describes her pain on the scale of 0-10 anywhere around 5-6 and sometimes it goes at the higher level.  She has noticed some swelling in her hands lately and she has difficulty wearing her rings.  She gives history of osteoporosis for many years.  She was treated with Fosamax, IV Reclast and later she has been on Prolia.  Activities of Daily Living:  Patient reports morning stiffness for 1 hour.   Patient Denies nocturnal pain.  Difficulty dressing/grooming: Denies Difficulty climbing stairs: Reports Difficulty getting out of chair: Reports Difficulty using hands for taps, buttons, cutlery, and/or writing: Denies   Review of Systems  Constitutional: Negative for fatigue, night sweats, weight gain and weight loss.  HENT: Positive for mouth dryness. Negative for mouth sores, trouble swallowing, trouble swallowing and nose dryness.   Eyes: Negative for pain, redness, visual  disturbance and dryness.  Respiratory: Negative for cough, shortness of breath and difficulty breathing.   Cardiovascular: Negative for chest pain, palpitations, hypertension, irregular heartbeat and swelling in legs/feet.  Gastrointestinal: Positive for constipation. Negative for blood in stool and diarrhea.  Endocrine: Negative for cold intolerance and increased urination.  Genitourinary: Negative for difficulty urinating and vaginal dryness.  Musculoskeletal: Positive for arthralgias, joint pain, joint swelling and morning stiffness. Negative for myalgias, muscle weakness, muscle tenderness and myalgias.  Skin: Negative for color change, rash, hair loss, skin tightness, ulcers and sensitivity to sunlight.  Allergic/Immunologic: Negative for susceptible to infections.  Neurological: Negative for dizziness, numbness, memory loss, night sweats and weakness.  Hematological: Negative for bruising/bleeding tendency and swollen glands.  Psychiatric/Behavioral: Positive for sleep disturbance. Negative for depressed mood. The patient is not nervous/anxious.     PMFS History:  Patient Active Problem List   Diagnosis Date Noted  . DDD (degenerative disc disease), lumbar 06/28/2017  . Chronic SI joint pain 06/28/2017  . Primary osteoarthritis of both hands 06/28/2017  . Primary osteoarthritis of both knees 06/28/2017  . 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 DEPRESSION 05/31/2006    Past Medical History:  Diagnosis Date  . Arthritis   . Depression   . Osteoporosis    Gets Reclast    Family History  Problem Relation Age  of Onset  . Stroke Mother   . Hypertension Mother   . Diabetes Brother   . Heart disease Brother   . Coronary artery disease Unknown   . Diabetes Maternal Grandmother   . Diabetes Maternal Grandfather   . Colon cancer Neg Hx   . Esophageal cancer Neg Hx   . Pancreatic cancer Neg Hx   . Rectal cancer Neg  Hx   . Stomach cancer Neg Hx    Past Surgical History:  Procedure Laterality Date  . HERNIA REPAIR  2/08  . KNEE ARTHROPLASTY    . KNEE SURGERY     Social History   Social History Narrative   Exercise-- water aerobics, yoga, machines     Objective: Vital Signs: BP 122/60 (BP Location: Left Arm, Patient Position: Sitting, Cuff Size: Normal)   Pulse 77   Resp 16   Ht 5\' 6"  (1.676 m)   Wt 160 lb (72.6 kg)   BMI 25.82 kg/m    Physical Exam  Constitutional: She is oriented to person, place, and time. She appears well-developed and well-nourished.  HENT:  Head: Normocephalic and atraumatic.  Eyes: Conjunctivae and EOM are normal.  Neck: Normal range of motion.  Cardiovascular: Normal rate, regular rhythm, normal heart sounds and intact distal pulses.  Pulmonary/Chest: Effort normal and breath sounds normal.  Abdominal: Soft. Bowel sounds are normal.  Lymphadenopathy:    She has no cervical adenopathy.  Neurological: She is alert and oriented to person, place, and time.  Skin: Skin is warm and dry. Capillary refill takes less than 2 seconds.  Psychiatric: She has a normal mood and affect. Her behavior is normal.  Nursing note and vitals reviewed.    Musculoskeletal Exam: Spine thoracic spine good range of motion.  She had discomfort with range of motion of her lumbar spine.  She had tenderness on palpation over left SI joint.  Shoulder joints and elbow joints were in good range of motion.  She has DIP PIP thickening in her bilateral hands with no synovitis.  She had good range of motion of bilateral hip joints.  She has some warmth and discomfort with range of motion of bilateral knee joints.  Ankle joints MTPs PIPs were in good range of motion with no synovitis.  CDAI Exam: No CDAI exam completed.    Investigation: Findings:  MRI 05/09/16: L2-L3 and L3-L4 DDD w/ Schmorl's node& edema at inferior endplate of L3. Diffuse facet arthrosis mild at all levels, L5-S1 grade 1  anterolistesis. L2-L3 right lateral recess stenosis  X-ray of the lumbar spine from [facility] March 02, 2016 showed dextroscoliosis with the spondylosis.  Significant narrowing was noted between L2-3, L3-4, L4-5.  L2-3 spondylolisthesis was noted.  Facet joint arthropathy was noted.  No syndesmophytes were noted.  Some osteoarthritic changes were noted in the SI joints.  CBC Latest Ref Rng & Units 12/01/2016 11/23/2015 08/02/2015  WBC 4.0 - 10.5 K/uL 6.2 7.6 6.8  Hemoglobin 12.0 - 15.0 g/dL 13.8 13.6 12.4  Hematocrit 36.0 - 46.0 % 42.3 40.1 37.4  Platelets 150.0 - 400.0 K/uL 226.0 192.0 221   CMP Latest Ref Rng & Units 12/01/2016 06/30/2016 11/23/2015  Glucose 70 - 99 mg/dL 104(H) 52(L) 90  BUN 6 - 23 mg/dL 13 19 13   Creatinine 0.40 - 1.20 mg/dL 0.89 0.92 0.88  Sodium 135 - 145 mEq/L 144 140 139  Potassium 3.5 - 5.1 mEq/L 5.5(H) 3.7 4.1  Chloride 96 - 112 mEq/L 105 104 103  CO2 19 -  32 mEq/L 29 29 29   Calcium 8.4 - 10.5 mg/dL 10.5 9.7 9.9  Total Protein 6.0 - 8.3 g/dL 8.1 - 8.3  Total Bilirubin 0.2 - 1.2 mg/dL 0.6 - 0.6  Alkaline Phos 39 - 117 U/L 91 - 78  AST 0 - 37 U/L 21 - 24  ALT 0 - 35 U/L 14 - 14     Imaging: No results found.  Speciality Comments: No specialty comments available.    Procedures:  Sacroiliac Joint Inj on 06/28/2017 9:33 AM Indications: pain Details: 27 G 1.5 in needle, posterior approach Medications: 1 mL lidocaine 1 %; 40 mg triamcinolone acetonide 40 MG/ML Aspirate: 0 mL Outcome: tolerated well, no immediate complications Procedure, treatment alternatives, risks and benefits explained, specific risks discussed. Consent was given by the patient. Immediately prior to procedure a time out was called to verify the correct patient, procedure, equipment, support staff and site/side marked as required. Patient was prepped and draped in the usual sterile fashion.     Allergies: Penicillins; Codeine; and Sulfonamide derivatives   Assessment /  Plan:     Visit Diagnoses: DDD (degenerative disc disease), lumbar - MRI 05/09/16: L2-L3 and L3-L4 DDD w/ Schmorl's node& edema at inferior endplate of L3. Diffuse facet arthrosis mild at all levelsseveral epidural inj.  I also reviewed x-rays of her lumbar spine which showed severe disc disease of lumbar spine.  She also had dextroscoliosis.  There was no point tenderness over lumbar spine.  Chronic SI joint pain -she had tenderness on palpation over left SI joint.  Plan: XR Pelvis 1-2 Views.  The x-rays were consistent with osteoarthritis.  After informed consent was obtained left SI joint was injected with cortisone and lidocaine as described above.  We will see response to that.  Primary osteoarthritis of both hands-she has clinical findings of osteoarthritis in her hands.  I have given her a handout on hand exercises.  Hand exercises were also demonstrated in the office.  I have given her prescription for diclofenac gel which can be used topically on osteoarthritic joints.  Side effects were discussed.  Primary osteoarthritis of both knees-patient reports that she has osteoarthritis in her bilateral knee joints and has been getting Visco supplement injections at [facility].  List of natural anti-inflammatories was given.  Age-related osteoporosis without current pathological fracture - on Prolia by her PCP.  Patient has tried Fosamax and Reclast in the past.  Other insomnia -she is on medication.   Orders: Orders Placed This Encounter  Procedures  . Sacroiliac Joint Inj  . XR Pelvis 1-2 Views   Meds ordered this encounter  Medications  . diclofenac sodium (VOLTAREN) 1 % GEL    Sig: Apply 3 gm to 3 large joints up to 3 times a day.Dispense 3 tubes with 3 refills.    Dispense:  3 Tube    Refill:  1    Face-to-face time spent with patient was 50 minutes. >50% of time was spent in counseling and coordination of care.  Follow-Up Instructions: Return for  Osteoarthritis.   Bo Merino, MD  Note - This record has been created using Editor, commissioning.  Chart creation errors have been sought, but may not always  have been located. Such creation errors do not reflect on  the standard of medical care.

## 2017-06-18 ENCOUNTER — Telehealth: Payer: Self-pay | Admitting: Family Medicine

## 2017-06-18 NOTE — Telephone Encounter (Signed)
Prolia benefits received PA not required Covered by 100%   Patient may owe approximately $0 OOP  Patient due after 06/30/17  Letter mailed to inform patient of benefits and to schedule

## 2017-06-19 ENCOUNTER — Telehealth: Payer: Self-pay | Admitting: *Deleted

## 2017-06-19 NOTE — Telephone Encounter (Signed)
Copied from [employer] [id number]. Topic: Quick Communication - See Telephone Encounter >> Jun 18, 2017  2:06 PM Laren Everts wrote: CRM for notification. See Telephone encounter for: 06/18/17. Letter mailed with Prolia benefits, please send CRM when patient schedules to office can order prolia >> Jun 19, 2017  3:31 PM Synthia Innocent wrote: Patient scheduled for 07/03/17

## 2017-06-20 NOTE — Telephone Encounter (Signed)
Med has been ordered

## 2017-06-20 NOTE — Telephone Encounter (Signed)
appt scheduled

## 2017-06-20 NOTE — Telephone Encounter (Signed)
Can you please order Prolia for patient.

## 2017-06-22 NOTE — Telephone Encounter (Signed)
Medication is in fridge for pt. 

## 2017-06-28 ENCOUNTER — Ambulatory Visit (INDEPENDENT_AMBULATORY_CARE_PROVIDER_SITE_OTHER): Payer: Self-pay

## 2017-06-28 ENCOUNTER — Ambulatory Visit (INDEPENDENT_AMBULATORY_CARE_PROVIDER_SITE_OTHER): Payer: Medicare Other | Admitting: Rheumatology

## 2017-06-28 ENCOUNTER — Encounter: Payer: Self-pay | Admitting: Rheumatology

## 2017-06-28 VITALS — BP 122/60 | HR 77 | Resp 16 | Ht 66.0 in | Wt 160.0 lb

## 2017-06-28 DIAGNOSIS — M533 Sacrococcygeal disorders, not elsewhere classified: Secondary | ICD-10-CM | POA: Diagnosis not present

## 2017-06-28 DIAGNOSIS — M19041 Primary osteoarthritis, right hand: Secondary | ICD-10-CM | POA: Diagnosis not present

## 2017-06-28 DIAGNOSIS — G8929 Other chronic pain: Secondary | ICD-10-CM

## 2017-06-28 DIAGNOSIS — M810 Age-related osteoporosis without current pathological fracture: Secondary | ICD-10-CM

## 2017-06-28 DIAGNOSIS — M5136 Other intervertebral disc degeneration, lumbar region: Secondary | ICD-10-CM | POA: Diagnosis not present

## 2017-06-28 DIAGNOSIS — M170 Bilateral primary osteoarthritis of knee: Secondary | ICD-10-CM | POA: Insufficient documentation

## 2017-06-28 DIAGNOSIS — M19042 Primary osteoarthritis, left hand: Secondary | ICD-10-CM | POA: Diagnosis not present

## 2017-06-28 DIAGNOSIS — G4709 Other insomnia: Secondary | ICD-10-CM

## 2017-06-28 MED ORDER — TRIAMCINOLONE ACETONIDE 40 MG/ML IJ SUSP
40.0000 mg | INTRAMUSCULAR | Status: AC | PRN
  Administered 2017-06-28: 40 mg via INTRA_ARTICULAR

## 2017-06-28 MED ORDER — LIDOCAINE HCL 1 % IJ SOLN
1.0000 mL | INTRAMUSCULAR | Status: AC | PRN
  Administered 2017-06-28: 1 mL

## 2017-06-28 MED ORDER — DICLOFENAC SODIUM 1 % TD GEL: TRANSDERMAL | 1 refills | Status: DC

## 2017-06-28 NOTE — Patient Instructions (Addendum)
Natural anti-inflammatories  You can purchase these at [employer], [employer] or online.  . Turmeric (capsules)  . Ginger (ginger root or capsules)  . Omega 3 (Fish, flax seeds, chia seeds, walnuts, almonds)  . Tart cherry (dried or extract)   Patient should be under the care of a physician while taking these supplements. This may not be reproduced without the permission of Dr. Kjuan Seipp.  Knee Exercises Ask your health care provider which exercises are safe for you. Do exercises exactly as told by your health care provider and adjust them as directed. It is normal to feel mild stretching, pulling, tightness, or discomfort as you do these exercises, but you should stop right away if you feel sudden pain or your pain gets worse.Do not begin these exercises until told by your health care provider. STRETCHING AND RANGE OF MOTION EXERCISES These exercises warm up your muscles and joints and improve the movement and flexibility of your knee. These exercises also help to relieve pain, numbness, and tingling. Exercise A: Knee Extension, Prone 1. Lie on your abdomen on a bed. 2. Place your left / right knee just beyond the edge of the surface so your knee is not on the bed. You can put a towel under your left / right thigh just above your knee for comfort. 3. Relax your leg muscles and allow gravity to straighten your knee. You should feel a stretch behind your left / right knee. 4. Hold this position for __________ seconds. 5. Scoot up so your knee is supported between repetitions. Repeat __________ times. Complete this stretch __________ times a day. Exercise B: Knee Flexion, Active  1. Lie on your back with both knees straight. If this causes back discomfort, bend your left / right knee so your foot is flat on the floor. 2. Slowly slide your left / right heel back toward your buttocks until you feel a gentle stretch in the front of your knee or thigh. 3. Hold this position for  __________ seconds. 4. Slowly slide your left / right heel back to the starting position. Repeat __________ times. Complete this exercise __________ times a day. Exercise C: Quadriceps, Prone  1. Lie on your abdomen on a firm surface, such as a bed or padded floor. 2. Bend your left / right knee and hold your ankle. If you cannot reach your ankle or pant leg, loop a belt around your foot and grab the belt instead. 3. Gently pull your heel toward your buttocks. Your knee should not slide out to the side. You should feel a stretch in the front of your thigh and knee. 4. Hold this position for __________ seconds. Repeat __________ times. Complete this stretch __________ times a day. Exercise D: Hamstring, Supine 1. Lie on your back. 2. Loop a belt or towel over the ball of your left / right foot. The ball of your foot is on the walking surface, right under your toes. 3. Straighten your left / right knee and slowly pull on the belt to raise your leg until you feel a gentle stretch behind your knee. ? Do not let your left / right knee bend while you do this. ? Keep your other leg flat on the floor. 4. Hold this position for __________ seconds. Repeat __________ times. Complete this stretch __________ times a day. STRENGTHENING EXERCISES These exercises build strength and endurance in your knee. Endurance is the ability to use your muscles for a long time, even after they get tired. Exercise E:   E: Quadriceps, Isometric  1. Lie on your back with your left / right leg extended and your other knee bent. Put a rolled towel or small pillow under your knee if told by your health care provider. 2. Slowly tense the muscles in the front of your left / right thigh. You should see your kneecap slide up toward your hip or see increased dimpling just above the knee. This motion will push the back of the knee toward the floor. 3. For __________ seconds, keep the muscle as tight as you can without increasing  your pain. 4. Relax the muscles slowly and completely. Repeat __________ times. Complete this exercise __________ times a day. Exercise F: Straight Leg Raises - Quadriceps 1. Lie on your back with your left / right leg extended and your other knee bent. 2. Tense the muscles in the front of your left / right thigh. You should see your kneecap slide up or see increased dimpling just above the knee. Your thigh may even shake a bit. 3. Keep these muscles tight as you raise your leg 4-6 inches (10-15 cm) off the floor. Do not let your knee bend. 4. Hold this position for __________ seconds. 5. Keep these muscles tense as you lower your leg. 6. Relax your muscles slowly and completely after each repetition. Repeat __________ times. Complete this exercise __________ times a day. Exercise G: Hamstring, Isometric 1. Lie on your back on a firm surface. 2. Bend your left / right knee approximately __________ degrees. 3. Dig your left / right heel into the surface as if you are trying to pull it toward your buttocks. Tighten the muscles in the back of your thighs to dig as hard as you can without increasing any pain. 4. Hold this position for __________ seconds. 5. Release the tension gradually and allow your muscles to relax completely for __________ seconds after each repetition. Repeat __________ times. Complete this exercise __________ times a day. Exercise H: Hamstring Curls  If told by your health care provider, do this exercise while wearing ankle weights. Begin with __________ weights. Then increase the weight by 1 lb (0.5 kg) increments. Do not wear ankle weights that are more than __________. 1. Lie on your abdomen with your legs straight. 2. Bend your left / right knee as far as you can without feeling pain. Keep your hips flat against the floor. 3. Hold this position for __________ seconds. 4. Slowly lower your leg to the starting position.  Repeat __________ times. Complete this exercise  __________ times a day. Exercise I: Squats (Quadriceps) 1. Stand in front of a table, with your feet and knees pointing straight ahead. You may rest your hands on the table for balance but not for support. 2. Slowly bend your knees and lower your hips like you are going to sit in a chair. ? Keep your weight over your heels, not over your toes. ? Keep your lower legs upright so they are parallel with the table legs. ? Do not let your hips go lower than your knees. ? Do not bend lower than told by your health care provider. ? If your knee pain increases, do not bend as low. 3. Hold the squat position for __________ seconds. 4. Slowly push with your legs to return to standing. Do not use your hands to pull yourself to standing. Repeat __________ times. Complete this exercise __________ times a day. Exercise J: Wall Slides (Quadriceps)  1. Lean your back against a smooth wall or door   you walk your feet out 18-24 inches (46-61 cm) from it. 2. Place your feet hip-width apart. 3. Slowly slide down the wall or door until your knees bend __________ degrees. Keep your knees over your heels, not over your toes. Keep your knees in line with your hips. 4. Hold for __________ seconds. Repeat __________ times. Complete this exercise __________ times a day. Exercise K: Straight Leg Raises - Hip Abductors 1. Lie on your side with your left / right leg in the top position. Lie so your head, shoulder, knee, and hip line up. You may bend your bottom knee to help you keep your balance. 2. Roll your hips slightly forward so your hips are stacked directly over each other and your left / right knee is facing forward. 3. Leading with your heel, lift your top leg 4-6 inches (10-15 cm). You should feel the muscles in your outer hip lifting. ? Do not let your foot drift forward. ? Do not let your knee roll toward the ceiling. 4. Hold this position for __________ seconds. 5. Slowly return your leg to the starting  position. 6. Let your muscles relax completely after each repetition. Repeat __________ times. Complete this exercise __________ times a day. Exercise L: Straight Leg Raises - Hip Extensors 1. Lie on your abdomen on a firm surface. You can put a pillow under your hips if that is more comfortable. 2. Tense the muscles in your buttocks and lift your left / right leg about 4-6 inches (10-15 cm). Keep your knee straight as you lift your leg. 3. Hold this position for __________ seconds. 4. Slowly lower your leg to the starting position. 5. Let your leg relax completely after each repetition. Repeat __________ times. Complete this exercise __________ times a day. This information is not intended to replace advice given to you by your health care provider. Make sure you discuss any questions you have with your health care provider. Document Released: 11/16/2004 Document Revised: 09/27/2015 Document Reviewed: 11/08/2014 Elsevier Interactive Patient Education  2018 [employer]  Hand Exercises Hand exercises can be helpful to almost anyone. These exercises can strengthen the hands, improve flexibility and movement, and increase blood flow to the hands. These results can make work and daily tasks easier. Hand exercises can be especially helpful for people who have joint pain from arthritis or have nerve damage from overuse (carpal tunnel syndrome). These exercises can also help people who have injured a hand. Most of these hand exercises are fairly gentle stretching routines. You can do them often throughout the day. Still, it is a good idea to ask your health care provider which exercises would be best for you. Warming your hands before exercise may help to reduce stiffness. You can do this with gentle massage or by placing your hands in warm water for 15 minutes. Also, make sure you pay attention to your level of hand pain as you begin an exercise routine. Exercises Knuckle Bend Repeat this exercise  5-10 times with each hand. 1. Stand or sit with your arm, hand, and all five fingers pointed straight up. Make sure your wrist is straight. 2. Gently and slowly bend your fingers down and inward until the tips of your fingers are touching the tops of your palm. 3. Hold this position for a few seconds. 4. Extend your fingers out to their original position, all pointing straight up again.  Finger Fan Repeat this exercise 5-10 times with each hand. 1. Hold your arm and hand out in   in front of you. Keep your wrist straight. 2. Squeeze your hand into a fist. 3. Hold this position for a few seconds. 4. [place] out, or spread apart, your hand and fingers as much as possible, stretching every joint fully.  Tabletop Repeat this exercise 5-10 times with each hand. 1. Stand or sit with your arm, hand, and all five fingers pointed straight up. Make sure your wrist is straight. 2. Gently and slowly bend your fingers at the knuckles where they meet the hand until your hand is making an upside-down L shape. Your fingers should form a tabletop. 3. Hold this position for a few seconds. 4. Extend your fingers out to their original position, all pointing straight up again.  Making Os Repeat this exercise 5-10 times with each hand. 1. Stand or sit with your arm, hand, and all five fingers pointed straight up. Make sure your wrist is straight. 2. Make an O shape by touching your pointer finger to your thumb. Hold for a few seconds. Then open your hand wide. 3. Repeat this motion with each finger on your hand.  Table Spread Repeat this exercise 5-10 times with each hand. 1. Place your hand on a table with your palm facing down. Make sure your wrist is straight. 2. Spread your fingers out as much as possible. Hold this position for a few seconds. 3. Slide your fingers back together again. Hold for a few seconds.  Ball Grip  Repeat this exercise 10-15 times with each hand. 1. Hold a tennis ball or another soft ball  in your hand. 2. While slowly increasing pressure, squeeze the ball as hard as possible. 3. Squeeze as hard as you can for 3-5 seconds. 4. Relax and repeat.  Wrist Curls Repeat this exercise 10-15 times with each hand. 1. Sit in a chair that has armrests. 2. Hold a light weight in your hand, such as a dumbbell that weighs 1-3 pounds (0.5-1.4 kg). Ask your health care provider what weight would be best for you. 3. Rest your hand just over the end of the chair arm with your palm facing up. 4. Gently pivot your wrist up and down while holding the weight. Do not twist your wrist from side to side.  Contact a health care provider if:  Your hand pain or discomfort gets much worse when you do an exercise.  Your hand pain or discomfort does not improve within 2 hours after you exercise. If you have any of these problems, stop doing these exercises right away. Do not do them again unless your health care provider says that you can. Get help right away if:  You develop sudden, severe hand pain. If this happens, stop doing these exercises right away. Do not do them again unless your health care provider says that you can. This information is not intended to replace advice given to you by your health care provider. Make sure you discuss any questions you have with your health care provider. Document Released: 12/14/2014 Document Revised: 06/10/2015 Document Reviewed: 07/13/2014 Elsevier Interactive Patient Education  2018 [place]. Back Exercises The following exercises strengthen the muscles that help to support the back. They also help to keep the lower back flexible. Doing these exercises can help to prevent back pain or lessen existing pain. If you have back pain or discomfort, try doing these exercises 2-3 times each day or as told by your health care provider. When the pain goes away, do them once each day, but increase the  number of times that you repeat the steps for each exercise (do more  repetitions). If you do not have back pain or discomfort, do these exercises once each day or as told by your health care provider. Exercises Single Knee to Chest  Repeat these steps 3-5 times for each leg: 1. Lie on your back on a firm bed or the floor with your legs extended. 2. Bring one knee to your chest. Your other leg should stay extended and in contact with the floor. 3. Hold your knee in place by grabbing your knee or thigh. 4. Pull on your knee until you feel a gentle stretch in your lower back. 5. Hold the stretch for 10-30 seconds. 6. Slowly release and straighten your leg.  Pelvic Tilt  Repeat these steps 5-10 times: 1. Lie on your back on a firm bed or the floor with your legs extended. 2. Bend your knees so they are pointing toward the ceiling and your feet are flat on the floor. 3. Tighten your lower abdominal muscles to press your lower back against the floor. This motion will tilt your pelvis so your tailbone points up toward the ceiling instead of pointing to your feet or the floor. 4. With gentle tension and even breathing, hold this position for 5-10 seconds.  Cat-Cow  Repeat these steps until your lower back becomes more flexible: 1. Get into a hands-and-knees position on a firm surface. Keep your hands under your shoulders, and keep your knees under your hips. You may place padding under your knees for comfort. 2. Let your head hang down, and point your tailbone toward the floor so your lower back becomes rounded like the back of a cat. 3. Hold this position for 5 seconds. 4. Slowly lift your head and point your tailbone up toward the ceiling so your back forms a sagging arch like the back of a cow. 5. Hold this position for 5 seconds.  Press-Ups  Repeat these steps 5-10 times: 1. Lie on your abdomen (face-down) on the floor. 2. Place your palms near your head, about shoulder-width apart. 3. While you keep your back as relaxed as possible and keep your hips on  the floor, slowly straighten your arms to raise the top half of your body and lift your shoulders. Do not use your back muscles to raise your upper torso. You may adjust the placement of your hands to make yourself more comfortable. 4. Hold this position for 5 seconds while you keep your back relaxed. 5. Slowly return to lying flat on the floor.  Bridges  Repeat these steps 10 times: 1. Lie on your back on a firm surface. 2. Bend your knees so they are pointing toward the ceiling and your feet are flat on the floor. 3. Tighten your buttocks muscles and lift your buttocks off of the floor until your waist is at almost the same height as your knees. You should feel the muscles working in your buttocks and the back of your thighs. If you do not feel these muscles, slide your feet 1-2 inches farther away from your buttocks. 4. Hold this position for 3-5 seconds. 5. Slowly lower your hips to the starting position, and allow your buttocks muscles to relax completely.  If this exercise is too easy, try doing it with your arms crossed over your chest. Abdominal Crunches  Repeat these steps 5-10 times: 1. Lie on your back on a firm bed or the floor with your legs extended. 2. [place] your  knees so they are pointing toward the ceiling and your feet are flat on the floor. 3. Cross your arms over your chest. 4. Tip your chin slightly toward your chest without bending your neck. 5. Tighten your abdominal muscles and slowly raise your trunk (torso) high enough to lift your shoulder blades a tiny bit off of the floor. Avoid raising your torso higher than that, because it can put too much stress on your low back and it does not help to strengthen your abdominal muscles. 6. Slowly return to your starting position.  Back Lifts Repeat these steps 5-10 times: 1. Lie on your abdomen (face-down) with your arms at your sides, and rest your forehead on the floor. 2. Tighten the muscles in your legs and your  buttocks. 3. Slowly lift your chest off of the floor while you keep your hips pressed to the floor. Keep the back of your head in line with the curve in your back. Your eyes should be looking at the floor. 4. Hold this position for 3-5 seconds. 5. Slowly return to your starting position.  Contact a health care provider if:  Your back pain or discomfort gets much worse when you do an exercise.  Your back pain or discomfort does not lessen within 2 hours after you exercise. If you have any of these problems, stop doing these exercises right away. Do not do them again unless your health care provider says that you can. Get help right away if:  You develop sudden, severe back pain. If this happens, stop doing the exercises right away. Do not do them again unless your health care provider says that you can. This information is not intended to replace advice given to you by your health care provider. Make sure you discuss any questions you have with your health care provider. Document Released: 02/10/2004 Document Revised: 05/12/2015 Document Reviewed: 02/26/2014 Elsevier Interactive Patient Education  2017 [employer].

## 2017-07-03 ENCOUNTER — Ambulatory Visit (INDEPENDENT_AMBULATORY_CARE_PROVIDER_SITE_OTHER): Payer: Medicare Other | Admitting: *Deleted

## 2017-07-03 DIAGNOSIS — M810 Age-related osteoporosis without current pathological fracture: Secondary | ICD-10-CM | POA: Diagnosis not present

## 2017-07-03 MED ORDER — DENOSUMAB 60 MG/ML ~~LOC~~ SOSY
60.0000 mg | PREFILLED_SYRINGE | Freq: Once | SUBCUTANEOUS | Status: AC
  Administered 2017-07-03: 60 mg via SUBCUTANEOUS

## 2017-07-03 NOTE — Progress Notes (Signed)
Pt here for Prolia injection per Dr Carollee Herter.  Prolia given SQ right arm and pt tolerated injection well but notes some burning at injection site. Advised pt that is common.   Advised her we will contact her when next Prolia is due and benefits have been verified. Also asked her to call us in 5 months to start the verification process and pt is agreeable.

## 2017-07-06 ENCOUNTER — Encounter: Payer: Self-pay | Admitting: Gastroenterology

## 2017-07-09 ENCOUNTER — Other Ambulatory Visit: Payer: Self-pay | Admitting: *Deleted

## 2017-07-09 MED ORDER — POLYETHYLENE GLYCOL 3350 17 GM/SCOOP PO POWD: ORAL | 3 refills | Status: DC

## 2017-07-20 NOTE — Progress Notes (Signed)
Office Visit Note  Patient: Charlotte Brooks             Date of Birth: July 30, 1945           MRN: [id number]             PCP: Ann Held, DO Referring: Ann Held, * Visit Date: 08/02/2017 Occupation: @GUAROCC @    Subjective:  Neck stiffness.   History of Present Illness: Charlotte Brooks is a 72 y.o. female with history of osteoarthritis and degenerative disc disease.  She states the left SI joint injection done in June 2019 was helpful.  She still has minimal discomfort.  She has been experiencing pain and stiffness in her neck for the last half.  She states the pain sometimes radiate into her left arm.  She denies any paresthesias in her left arm.  She complains of some discomfort with range of motion of her cervical spine.  The hands and knee joints are doing quite well.  Activities of Daily Living:  Patient reports morning stiffness for 1 hour.   Patient Reports nocturnal pain.  Difficulty dressing/grooming: Denies Difficulty climbing stairs: Reports Difficulty getting out of chair: Denies Difficulty using hands for taps, buttons, cutlery, and/or writing: Denies   Review of Systems  Constitutional: Negative for fatigue, night sweats, weight gain and weight loss.  HENT: Negative for mouth sores, trouble swallowing, trouble swallowing, mouth dryness and nose dryness.   Eyes: Positive for dryness. Negative for pain, redness and visual disturbance.  Respiratory: Negative for cough, shortness of breath and difficulty breathing.   Cardiovascular: Negative for chest pain, palpitations, hypertension, irregular heartbeat and swelling in legs/feet.  Gastrointestinal: Positive for constipation. Negative for abdominal pain, blood in stool and diarrhea.  Endocrine: Negative for increased urination.  Genitourinary: Negative for pelvic pain and vaginal dryness.  Musculoskeletal: Positive for arthralgias, joint pain and morning stiffness. Negative for joint swelling,  myalgias, muscle weakness, muscle tenderness and myalgias.  Skin: Negative for color change, rash, hair loss, skin tightness, ulcers and sensitivity to sunlight.  Allergic/Immunologic: Negative for susceptible to infections.  Neurological: Negative for dizziness, light-headedness, headaches, memory loss, night sweats and weakness.  Hematological: Negative for bruising/bleeding tendency and swollen glands.  Psychiatric/Behavioral: Negative for depressed mood, confusion and sleep disturbance. The patient is not nervous/anxious.     PMFS History:  Patient Active Problem List   Diagnosis Date Noted  . DDD (degenerative disc disease), lumbar 06/28/2017  . Chronic SI joint pain 06/28/2017  . Primary osteoarthritis of both hands 06/28/2017  . Primary osteoarthritis of both knees 06/28/2017  . 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 DEPRESSION 05/31/2006    Past Medical History:  Diagnosis Date  . Arthritis   . Depression   . Osteoporosis    Gets Reclast    Family History  Problem Relation Age of Onset  . Stroke Mother   . Hypertension Mother   . Diabetes Brother   . Heart disease Brother   . Coronary artery disease Unknown   . Diabetes Maternal Grandmother   . Diabetes Maternal Grandfather   . Colon cancer Neg Hx   . Esophageal cancer Neg Hx   . Pancreatic cancer Neg Hx   . Rectal cancer Neg Hx   . Stomach cancer Neg Hx    Past Surgical History:  Procedure Laterality Date  . HERNIA REPAIR  2/08  . KNEE ARTHROPLASTY    . KNEE  SURGERY     Social History   Social History Narrative   Exercise-- water aerobics, yoga, machines     Objective: Vital Signs: BP 136/77 (BP Location: Left Arm, Patient Position: Sitting, Cuff Size: Normal)   Pulse 79   Resp 13   Ht 5\' 6"  (1.676 m)   Wt 160 lb (72.6 kg)   BMI 25.82 kg/m    Physical Exam  Constitutional: She is oriented to person, place, and time. She appears  well-developed and well-nourished.  HENT:  Head: Normocephalic and atraumatic.  Eyes: Conjunctivae and EOM are normal.  Neck: Normal range of motion.  Cardiovascular: Normal rate, regular rhythm, normal heart sounds and intact distal pulses.  Pulmonary/Chest: Effort normal and breath sounds normal.  Abdominal: Soft. Bowel sounds are normal.  Lymphadenopathy:    She has no cervical adenopathy.  Neurological: She is alert and oriented to person, place, and time.  Skin: Skin is warm and dry. Capillary refill takes less than 2 seconds.  Psychiatric: She has a normal mood and affect. Her behavior is normal.  Nursing note and vitals reviewed.    Musculoskeletal Exam: She has some discomfort range of motion of her C-spine.  She has left trapezius spasm.  Shoulder joints were in good range of motion.  Elbow joints were in good range of motion.  She has DIP PIP thickening in her hands consistent with osteoarthritis.  Hip joints were in good range of motion.  She is some crepitus in her knee joints.  Some discomfort was noted over the left SI joint.  CDAI Exam: No CDAI exam completed.    Investigation: No additional findings. CBC Latest Ref Rng & Units 12/01/2016 11/23/2015 08/02/2015  WBC 4.0 - 10.5 K/uL 6.2 7.6 6.8  Hemoglobin 12.0 - 15.0 g/dL 13.8 13.6 12.4  Hematocrit 36.0 - 46.0 % 42.3 40.1 37.4  Platelets 150.0 - 400.0 K/uL 226.0 192.0 221   CMP Latest Ref Rng & Units 12/01/2016 06/30/2016 11/23/2015  Glucose 70 - 99 mg/dL 104(H) 52(L) 90  BUN 6 - 23 mg/dL 13 19 13   Creatinine 0.40 - 1.20 mg/dL 0.89 0.92 0.88  Sodium 135 - 145 mEq/L 144 140 139  Potassium 3.5 - 5.1 mEq/L 5.5(H) 3.7 4.1  Chloride 96 - 112 mEq/L 105 104 103  CO2 19 - 32 mEq/L 29 29 29   Calcium 8.4 - 10.5 mg/dL 10.5 9.7 9.9  Total Protein 6.0 - 8.3 g/dL 8.1 - 8.3  Total Bilirubin 0.2 - 1.2 mg/dL 0.6 - 0.6  Alkaline Phos 39 - 117 U/L 91 - 78  AST 0 - 37 U/L 21 - 24  ALT 0 - 35 U/L 14 - 14    Imaging: Xr Cervical  Spine 2 Or 3 Views  Result Date: 08/02/2017 Mild C6-7 narrowing was noted.  No significant facet joint arthropathy was noted.   Speciality Comments: No specialty comments available.    Procedures:  Trigger Point Inj Date/Time: 08/02/2017 10:08 AM Performed by: Bo Merino, MD Authorized by: Bo Merino, MD   Consent Given by:  Patient Site marked: the procedure site was marked   Timeout: prior to procedure the correct patient, procedure, and site was verified   Indications:  Muscle spasm and pain Total # of Trigger Points:  1 Location: neck   Needle Size:  27 G Approach:  Dorsal Medications #1:  0.5 mL lidocaine 1 %; 10 mg triamcinolone acetonide 40 MG/ML Patient tolerance:  Patient tolerated the procedure well with no immediate complications  Allergies: Penicillins; Codeine; and Sulfonamide derivatives   Assessment / Plan:     Visit Diagnoses: Neck pain -patient experiencing neck pain and stiffness.  Plan: XR Cervical Spine 2 or 3 views.  Patient is having stiffness in her neck with the left trapezius spasm.  After informed consent was obtained left trapezius was injected with cortisone.  The x-ray was unremarkable except for mild C6-7 narrowing.  Have given her a handout on neck exercises.  DDD (degenerative disc disease), lumbar - MRI 05/09/16: L2-L3 and L3-L4 DDD w/ Schmorl's node& edema at inferior endplate of L3. Diffuse facet arthrosis mild at all levelsseveral epidural inj.   Chronic SI joint pain - Left, XR consistent with OA Cortisone injection 06/28/17 was helpful.  She has minimal discomfort now.  Primary osteoarthritis of both hands-joint protection muscle strengthening was discussed.  Primary osteoarthritis of both knees - Visco supplement injections at [facility] has been helpful.  Age-related osteoporosis without current pathological fracture - on Prolia SQ. ordered by PCP.  He was treated with Fosamax and Reclast in the past.  Other  insomnia    Orders: Orders Placed This Encounter  Procedures  . Trigger Point Inj  . XR Cervical Spine 2 or 3 views   No orders of the defined types were placed in this encounter.   Face-to-face time spent with patient was 30 minutes. Greater than 50% of time was spent in counseling and coordination of care.  Follow-Up Instructions: Return in about 6 months (around 02/02/2018) for Osteoarthritis.   Bo Merino, MD  Note - This record has been created using Editor, commissioning.  Chart creation errors have been sought, but may not always  have been located. Such creation errors do not reflect on  the standard of medical care.

## 2017-08-01 ENCOUNTER — Telehealth: Payer: Self-pay | Admitting: Rheumatology

## 2017-08-01 NOTE — Telephone Encounter (Signed)
Stephen from [facility] called to confirm the number of refills (1 or 3)  on the prescription of Diclofenac Sodium 1% Gel.  Per Seth Bake I confirmed that the refill amount was 3.

## 2017-08-02 ENCOUNTER — Encounter: Payer: Self-pay | Admitting: Gastroenterology

## 2017-08-02 ENCOUNTER — Encounter: Payer: Self-pay | Admitting: Rheumatology

## 2017-08-02 ENCOUNTER — Ambulatory Visit (INDEPENDENT_AMBULATORY_CARE_PROVIDER_SITE_OTHER): Payer: Self-pay

## 2017-08-02 ENCOUNTER — Ambulatory Visit (INDEPENDENT_AMBULATORY_CARE_PROVIDER_SITE_OTHER): Payer: Medicare Other | Admitting: Rheumatology

## 2017-08-02 VITALS — BP 136/77 | HR 79 | Resp 13 | Ht 66.0 in | Wt 160.0 lb

## 2017-08-02 DIAGNOSIS — G4709 Other insomnia: Secondary | ICD-10-CM | POA: Diagnosis not present

## 2017-08-02 DIAGNOSIS — M533 Sacrococcygeal disorders, not elsewhere classified: Secondary | ICD-10-CM

## 2017-08-02 DIAGNOSIS — M542 Cervicalgia: Secondary | ICD-10-CM | POA: Diagnosis not present

## 2017-08-02 DIAGNOSIS — G8929 Other chronic pain: Secondary | ICD-10-CM

## 2017-08-02 DIAGNOSIS — M19041 Primary osteoarthritis, right hand: Secondary | ICD-10-CM | POA: Diagnosis not present

## 2017-08-02 DIAGNOSIS — M170 Bilateral primary osteoarthritis of knee: Secondary | ICD-10-CM

## 2017-08-02 DIAGNOSIS — M810 Age-related osteoporosis without current pathological fracture: Secondary | ICD-10-CM

## 2017-08-02 DIAGNOSIS — M5136 Other intervertebral disc degeneration, lumbar region: Secondary | ICD-10-CM | POA: Diagnosis not present

## 2017-08-02 DIAGNOSIS — M19042 Primary osteoarthritis, left hand: Secondary | ICD-10-CM

## 2017-08-02 MED ORDER — TRIAMCINOLONE ACETONIDE 40 MG/ML IJ SUSP
10.0000 mg | INTRAMUSCULAR | Status: AC | PRN
  Administered 2017-08-02: 10 mg via INTRAMUSCULAR

## 2017-08-02 MED ORDER — LIDOCAINE HCL 1 % IJ SOLN
0.5000 mL | INTRAMUSCULAR | Status: AC | PRN
  Administered 2017-08-02: .5 mL

## 2017-08-02 NOTE — Patient Instructions (Signed)
Cervical Strain and Sprain Rehab Ask your health care provider which exercises are safe for you. Do exercises exactly as told by your health care provider and adjust them as directed. It is normal to feel mild stretching, pulling, tightness, or discomfort as you do these exercises, but you should stop right away if you feel sudden pain or your pain gets worse.Do not begin these exercises until told by your health care provider. Stretching and range of motion exercises These exercises warm up your muscles and joints and improve the movement and flexibility of your neck. These exercises also help to relieve pain, numbness, and tingling. Exercise A: Cervical side bend  1. Using good posture, sit on a stable chair or stand up. 2. Without moving your shoulders, slowly tilt your left / right ear to your shoulder until you feel a stretch in your neck muscles. You should be looking straight ahead. 3. Hold for __________ seconds. 4. Repeat with the other side of your neck. Repeat __________ times. Complete this exercise __________ times a day. Exercise B: Cervical rotation  1. Using good posture, sit on a stable chair or stand up. 2. Slowly turn your head to the side as if you are looking over your left / right shoulder. ? Keep your eyes level with the ground. ? Stop when you feel a stretch along the side and the back of your neck. 3. Hold for __________ seconds. 4. Repeat this by turning to your other side. Repeat __________ times. Complete this exercise __________ times a day. Exercise C: Thoracic extension and pectoral stretch 1. Roll a towel or a small blanket so it is about 4 inches (10 cm) in diameter. 2. Lie down on your back on a firm surface. 3. Put the towel lengthwise, under your spine in the middle of your back. It should not be not under your shoulder blades. The towel should line up with your spine from your middle back to your lower back. 4. Put your hands behind your head and let your  elbows fall out to your sides. 5. Hold for __________ seconds. Repeat __________ times. Complete this exercise __________ times a day. Strengthening exercises These exercises build strength and endurance in your neck. Endurance is the ability to use your muscles for a long time, even after your muscles get tired. Exercise D: Upper cervical flexion, isometric 1. Lie on your back with a thin pillow behind your head and a small rolled-up towel under your neck. 2. Gently tuck your chin toward your chest and nod your head down to look toward your feet. Do not lift your head off the pillow. 3. Hold for __________ seconds. 4. Release the tension slowly. Relax your neck muscles completely before you repeat this exercise. Repeat __________ times. Complete this exercise __________ times a day. Exercise E: Cervical extension, isometric  1. Stand about 6 inches (15 cm) away from a wall, with your back facing the wall. 2. Place a soft object, about 6-8 inches (15-20 cm) in diameter, between the back of your head and the wall. A soft object could be a small pillow, a ball, or a folded towel. 3. Gently tilt your head back and press into the soft object. Keep your jaw and forehead relaxed. 4. Hold for __________ seconds. 5. Release the tension slowly. Relax your neck muscles completely before you repeat this exercise. Repeat __________ times. Complete this exercise __________ times a day. Posture and body mechanics  Body mechanics refers to the movements and positions of   your body while you do your daily activities. Posture is part of body mechanics. Good posture and healthy body mechanics can help to relieve stress in your body's tissues and joints. Good posture means that your spine is in its natural S-curve position (your spine is neutral), your shoulders are pulled back slightly, and your head is not tipped forward. The following are general guidelines for applying improved posture and body mechanics to  your everyday activities. Standing  When standing, keep your spine neutral and keep your feet about hip-width apart. Keep a slight bend in your knees. Your ears, shoulders, and hips should line up.  When you do a task in which you stand in one place for a long time, place one foot up on a stable object that is 2-4 inches (5-10 cm) high, such as a footstool. This helps keep your spine neutral. Sitting   When sitting, keep your spine neutral and your keep feet flat on the floor. Use a footrest, if necessary, and keep your thighs parallel to the floor. Avoid rounding your shoulders, and avoid tilting your head forward.  When working at a desk or a computer, keep your desk at a height where your hands are slightly lower than your elbows. Slide your chair under your desk so you are close enough to maintain good posture.  When working at a computer, place your monitor at a height where you are looking straight ahead and you do not have to tilt your head forward or downward to look at the screen. Resting When lying down and resting, avoid positions that are most painful for you. Try to support your neck in a neutral position. You can use a contour pillow or a small rolled-up towel. Your pillow should support your neck but not push on it. This information is not intended to replace advice given to you by your health care provider. Make sure you discuss any questions you have with your health care provider. Document Released: 01/02/2005 Document Revised: 09/09/2015 Document Reviewed: 12/09/2014 Elsevier Interactive Patient Education  2018 [employer]vier Inc.  

## 2017-08-07 ENCOUNTER — Other Ambulatory Visit: Payer: Self-pay | Admitting: *Deleted

## 2017-08-07 MED ORDER — DICLOFENAC SODIUM 1 % TD GEL: TRANSDERMAL | 3 refills | Status: DC

## 2017-08-07 MED ORDER — POLYETHYLENE GLYCOL 3350 17 GM/SCOOP PO POWD: ORAL | 3 refills | Status: DC

## 2017-08-07 NOTE — Progress Notes (Signed)
This was sent in on 07/09/2017 to [facility] they are claiming to the patient they never received refills

## 2017-08-07 NOTE — Telephone Encounter (Signed)
Refill request received via fax  Last Visit: 08/02/17 Next visit: 01/30/18  Okay to refill per Dr. Estanislado Pandy

## 2017-09-25 DIAGNOSIS — N950 Postmenopausal bleeding: Secondary | ICD-10-CM | POA: Insufficient documentation

## 2017-10-17 ENCOUNTER — Telehealth: Payer: Self-pay | Admitting: *Deleted

## 2017-10-17 NOTE — Telephone Encounter (Signed)
Received Medical records from [facility]; forwarded to provider/SLS 10/02

## 2017-10-23 ENCOUNTER — Encounter: Payer: Self-pay | Admitting: Gastroenterology

## 2017-10-23 ENCOUNTER — Ambulatory Visit (INDEPENDENT_AMBULATORY_CARE_PROVIDER_SITE_OTHER): Payer: Medicare Other | Admitting: Gastroenterology

## 2017-10-23 VITALS — BP 118/74 | HR 81 | Ht 66.0 in | Wt 156.8 lb

## 2017-10-23 DIAGNOSIS — K5901 Slow transit constipation: Secondary | ICD-10-CM

## 2017-10-23 DIAGNOSIS — Z86010 Personal history of colonic polyps: Secondary | ICD-10-CM

## 2017-10-23 NOTE — Patient Instructions (Addendum)
We have given you samples of Trulance to take once daily for 1 week, if these work for you we will send in a prescription to your pharmacy  Do not take Miralax and Trulance together   Your recall colonoscopy is in 12/2017   Take Miralax 1 capful twice daily  Use Benefiber 1 teaspoon three times a day with meals   Follow up in 6 months  If you are age 72 or older, your body mass index should be between 23-30. Your Body mass index is 25.31 kg/m. If this is out of the aforementioned range listed, please consider follow up with your Primary Care Provider.  If you are age 16 or younger, your body mass index should be between 19-25. Your Body mass index is 25.31 kg/m. If this is out of the aformentioned range listed, please consider follow up with your Primary Care Provider.    If you are age 72 or older, your body mass index should be between 23-30. Your Body mass index is 25.31 kg/m. If this is out of the aforementioned range listed, please consider follow up with your Primary Care Provider.  If you are age 59 or younger, your body mass index should be between 19-25. Your Body mass index is 25.31 kg/m. If this is out of the aformentioned range listed, please consider follow up with your Primary Care Provider.

## 2017-10-23 NOTE — Progress Notes (Signed)
Charlotte Brooks    [id number]    03-13-1945  Primary Care Physician:Lowne Cheri Rous Alferd Apa, DO  Referring Physician: Carollee Herter, Alferd Apa, DO [address] [place], [place] [address]  Chief complaint: Constipation HPI: 72 year old female with chronic constipation here for follow-up visit. She is currently taking MiraLAX 1 capful daily with bowel movement once every 2 to 3 days.  She also takes herbal medication "Swiss Criss "once or twice a week as needed.  She was on Linzess 290 mcg daily, work for some time but after that it stopped working. Occasional bright red blood per rectum when she wipes Denies any dysphagia, nausea, vomiting, abdominal pain, loss of appetite, weight loss or melena.  Colonoscopy December 29, 2016 multiple large adenomatous polyps removed.  Left-sided diverticulosis and melanosis coli.  Recall colonoscopy 1 year  Outpatient Encounter Medications as of 10/23/2017  Medication Sig  . aspirin 81 MG chewable tablet Chew 81 mg by mouth daily.  . Biotin 5000 MCG CAPS Take 1 capsule by mouth daily.  . Cholecalciferol (VITAMIN D PO) Take 1,000 Units by mouth daily.  Marland Kitchen denosumab (PROLIA) 60 MG/ML SOLN injection Inject 60 mg into the skin every 6 (six) months. Administer in upper arm, thigh, or abdomen  . diclofenac sodium (VOLTAREN) 1 % GEL Apply 3 gm to 3 large joints up to 3 times a day.  Marland Kitchen glucosamine-chondroitin 500-400 MG tablet Take 1 tablet by mouth daily.  Marland Kitchen ibuprofen (ADVIL,MOTRIN) 200 MG tablet Take 800 mg by mouth every 6 (six) hours as needed for fever, headache, mild pain, moderate pain or cramping.  . Misc Natural Products (TART CHERRY ADVANCED PO) Take by mouth.  . polyethylene glycol powder (GLYCOLAX/MIRALAX) powder USING MEASURING CAP MIX  17GM IN WATER AND DRINK  EVERY MORNING  . vitamin C (ASCORBIC ACID) 500 MG tablet Take 1,000 mg by mouth daily.    No facility-administered encounter medications on file as of 10/23/2017.       Allergies as of 10/23/2017 - Review Complete 10/23/2017  Allergen Reaction Noted  . Penicillins Anaphylaxis and Swelling 07/22/2007  . Codeine Nausea And Vomiting 07/22/2007  . Sulfonamide derivatives Rash 07/22/2007    Past Medical History:  Diagnosis Date  . Arthritis   . Depression   . Osteoporosis    Gets Reclast    Past Surgical History:  Procedure Laterality Date  . HERNIA REPAIR  2/08  . KNEE ARTHROPLASTY    . KNEE SURGERY      Family History  Problem Relation Age of Onset  . Stroke Mother   . Hypertension Mother   . Diabetes Brother   . Heart disease Brother   . Coronary artery disease Unknown   . Diabetes Maternal Grandmother   . Diabetes Maternal Grandfather   . Colon cancer Neg Hx   . Esophageal cancer Neg Hx   . Pancreatic cancer Neg Hx   . Rectal cancer Neg Hx   . Stomach cancer Neg Hx     Social History   Socioeconomic History  . Marital status: Married    Spouse name: Not on file  . Number of children: Not on file  . Years of education: Not on file  . Highest education level: Not on file  Occupational History  . Not on file  Social Needs  . Financial resource strain: Not on file  . Food insecurity:    Worry: Not on file  Inability: Not on file  . Transportation needs:    Medical: Not on file    Non-medical: Not on file  Tobacco Use  . Smoking status: Never Smoker  . Smokeless tobacco: Never Used  Substance and Sexual Activity  . Alcohol use: No    Frequency: Never    Comment: OCC WINE1-2 yearly  . Drug use: Never  . Sexual activity: Yes    Partners: Male  Lifestyle  . Physical activity:    Days per week: Not on file    Minutes per session: Not on file  . Stress: Not on file  Relationships  . Social connections:    Talks on phone: Not on file    Gets together: Not on file    Attends religious service: Not on file    Active member of club or organization: Not on file    Attends meetings of clubs or organizations: Not on  file    Relationship status: Not on file  . Intimate partner violence:    Fear of current or ex partner: Not on file    Emotionally abused: Not on file    Physically abused: Not on file    Forced sexual activity: Not on file  Other Topics Concern  . Not on file  Social History Narrative   Exercise-- water aerobics, yoga, machines      Review of systems: Review of Systems  Constitutional: Negative for fever and chills.  HENT: Negative.   Eyes: Negative for blurred vision.  Respiratory: Negative for cough, shortness of breath and wheezing.   Cardiovascular: Negative for chest pain and palpitations.  Gastrointestinal: as per HPI Genitourinary: Negative for dysuria, urgency, frequency and hematuria.  Musculoskeletal: Positive for myalgias, back pain and joint pain.  Skin: Negative for itching and rash.  Neurological: Negative for dizziness, tremors, focal weakness, seizures and loss of consciousness.  Endo/Heme/Allergies: Positive for seasonal allergies.  Psychiatric/Behavioral: Negative for depression, suicidal ideas and hallucinations.  All other systems reviewed and are negative.   Physical Exam: Vitals:   10/23/17 1342  BP: 118/74  Pulse: 81   Body mass index is 25.31 kg/m. Gen:      No acute distress HEENT:  EOMI, sclera anicteric Neck:     No masses; no thyromegaly Lungs:    Clear to auscultation bilaterally; normal respiratory effort CV:         Regular rate and rhythm; no murmurs Abd:      + bowel sounds; soft, non-tender; no palpable masses, no distension Ext:    No edema; adequate peripheral perfusion Skin:      Warm and dry; no rash Neuro: alert and oriented x 3 Psych: normal mood and affect  Data Reviewed:  Reviewed labs, radiology imaging, old records and pertinent past GI work up   Assessment and Plan/Recommendations:  72 year old female with history of chronic constipation secondary to colonic slow transit Continue high-fiber diet Start Benefiber  1 teaspoon 3 times daily with meals Increase water intake to 60 to 80 ounces per day Increase MiraLAX to 1 capful twice daily Will do a trial of Trulance 3 mg daily for 1 week Due for recall colonoscopy December 2019 due to removal of multiple large adenomatous polyps. Return in 6 months or sooner if needed  25 minutes was spent face-to-face with the patient. Greater than 50% of the time used for counseling as well as treatment plan and follow-up. She had multiple questions which were answered to her satisfaction  K. Denzil Magnuson ,  MD [phone]    CC: Carollee Herter, Alferd Apa, *

## 2017-10-24 ENCOUNTER — Encounter: Payer: Self-pay | Admitting: Gastroenterology

## 2017-11-02 ENCOUNTER — Telehealth: Payer: Self-pay

## 2017-11-02 DIAGNOSIS — I739 Peripheral vascular disease, unspecified: Secondary | ICD-10-CM

## 2017-11-02 NOTE — Telephone Encounter (Signed)
We may need to repeat art doppler / abi in radiology ----- if low here ---refer to cardiology

## 2017-11-02 NOTE — Telephone Encounter (Signed)
Heather, NP from home [facility] calling to notify Dr. Etter Sjogren that a Quantaflo test was performed on the bilateral lower extremities and PAD on L leg was -0.85; PAD on R leg was 0.81. Pt asymptomatic per report. Heather stated Dr. Etter Sjogren should get report sent to her within the next few weeks. Routed to Dr. Etter Sjogren.

## 2017-11-08 NOTE — Addendum Note (Signed)
Addended by: Roma Schanz R on: 11/08/2017 10:38 AM   Modules accepted: Orders

## 2017-11-08 NOTE — Addendum Note (Signed)
Addended by: Raynelle Dick R on: 11/08/2017 10:08 AM   Modules accepted: Orders

## 2017-11-08 NOTE — Telephone Encounter (Signed)
Author phoned pt. To review Dr. Nonda Lou recommendation for repeat doppler/abi. Pt. Agreeable. Order pended for Dr. Nonda Lou review.

## 2017-11-15 DIAGNOSIS — R87619 Unspecified abnormal cytological findings in specimens from cervix uteri: Secondary | ICD-10-CM | POA: Insufficient documentation

## 2017-12-27 ENCOUNTER — Telehealth: Payer: Self-pay

## 2017-12-27 NOTE — Telephone Encounter (Signed)
Prolia benefits verified, no PA needed. Pt. May owe approximately $0 OOP. Pt. Phoned in to schedule NV. Last prolia injection received 07/03/17. Appointment with nurse made for 12/20 at 245PM. Prolia medication to be ordered by author.

## 2018-01-04 ENCOUNTER — Ambulatory Visit (INDEPENDENT_AMBULATORY_CARE_PROVIDER_SITE_OTHER): Payer: Medicare Other

## 2018-01-04 DIAGNOSIS — M810 Age-related osteoporosis without current pathological fracture: Secondary | ICD-10-CM | POA: Diagnosis not present

## 2018-01-04 MED ORDER — DENOSUMAB 60 MG/ML ~~LOC~~ SOSY
60.0000 mg | PREFILLED_SYRINGE | Freq: Once | SUBCUTANEOUS | Status: AC
  Administered 2018-01-04: 60 mg via SUBCUTANEOUS

## 2018-01-04 NOTE — Telephone Encounter (Signed)
Order was not right and never ordered.  Patient came into office today.  Santiago Glad will call her to let her know that test was reordered.

## 2018-01-04 NOTE — Addendum Note (Signed)
Addended by: Kem Boroughs D on: 01/04/2018 04:42 PM   Modules accepted: Orders

## 2018-01-17 NOTE — Progress Notes (Signed)
Office Visit Note  Patient: Charlotte Brooks             Date of Birth: [birth year]           MRN: [id number]             PCP: Ann Held, DO Referring: Ann Held, * Visit Date: 01/30/2018 Occupation: @GUAROCC @  Subjective:  Bilateral knee joint pain   History of Present Illness: Charlotte Brooks is a 73 y.o. female with history of osteoarthritis, DDD, and osteoporosis.  She uses Voltaren gel topically PRN.  She reports that the left SI joint injection on 06/28/17 provided significant relief.  She states she has noticed some atrophy following the cortisone injection.  She states the trigger point injections at the last visit on 08/02/17 helped her as well.  She has minimal pain in both hands but she denies any joint swelling. She performs hand exercises on a regular basis to help with stiffness.  She continues to have chronic pain in both knee joints but denies any joint swelling.    Activities of Daily Living:  Patient reports morning stiffness for 1 hour.   Patient Reports nocturnal pain.  Difficulty dressing/grooming: Denies Difficulty climbing stairs: Reports Difficulty getting out of chair: Denies Difficulty using hands for taps, buttons, cutlery, and/or writing: Denies  Review of Systems  Constitutional: Negative for fatigue.  HENT: Positive for mouth dryness. Negative for mouth sores, trouble swallowing, trouble swallowing and nose dryness.   Eyes: Negative for pain, redness, itching, visual disturbance and dryness.  Respiratory: Negative for cough, hemoptysis, shortness of breath, wheezing and difficulty breathing.   Cardiovascular: Negative for chest pain, palpitations, hypertension, irregular heartbeat and swelling in legs/feet.  Gastrointestinal: Positive for constipation. Negative for abdominal pain, blood in stool and diarrhea.  Endocrine: Negative for increased urination.  Genitourinary: Negative for painful urination, nocturia and pelvic pain.    Musculoskeletal: Positive for joint swelling and morning stiffness. Negative for arthralgias, joint pain, myalgias, muscle weakness, muscle tenderness and myalgias.  Skin: Negative for color change, pallor, rash, hair loss, nodules/bumps, skin tightness, ulcers and sensitivity to sunlight.  Allergic/Immunologic: Negative for susceptible to infections.  Neurological: Negative for dizziness, light-headedness, numbness, headaches, memory loss and weakness.  Hematological: Negative for swollen glands.  Psychiatric/Behavioral: Negative for depressed mood, confusion and sleep disturbance. The patient is not nervous/anxious.     PMFS History:  Patient Active Problem List   Diagnosis Date Noted  . DDD (degenerative disc disease), lumbar 06/28/2017  . Chronic SI joint pain 06/28/2017  . Primary osteoarthritis of both hands 06/28/2017  . Primary osteoarthritis of both knees 06/28/2017  . 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 DEPRESSION 05/31/2006    Past Medical History:  Diagnosis Date  . Arthritis   . Depression   . Osteoporosis    Gets Reclast    Family History  Problem Relation Age of Onset  . Stroke Mother   . Hypertension Mother   . Diabetes Brother   . Heart disease Brother   . Coronary artery disease Other   . Diabetes Maternal Grandmother   . Diabetes Maternal Grandfather   . Colon cancer Neg Hx   . Esophageal cancer Neg Hx   . Pancreatic cancer Neg Hx   . Rectal cancer Neg Hx   . Stomach cancer Neg Hx    Past Surgical History:  Procedure Laterality Date  . HERNIA REPAIR  2/08  .  KNEE ARTHROPLASTY    . KNEE SURGERY     Social History   Social History Narrative   Exercise-- water aerobics, yoga, machines    Objective: Vital Signs: BP 118/72 (BP Location: Left Arm, Patient Position: Sitting, Cuff Size: Normal)   Pulse 75   Resp 12   Ht 5\' 6"  (1.676 m)   Wt 159 lb (72.1 kg)   BMI 25.66 kg/m     Physical Exam Vitals signs and nursing note reviewed.  Constitutional:      Appearance: She is well-developed.  HENT:     Head: Normocephalic and atraumatic.  Eyes:     Conjunctiva/sclera: Conjunctivae normal.  Neck:     Musculoskeletal: Normal range of motion.  Cardiovascular:     Rate and Rhythm: Normal rate and regular rhythm.     Heart sounds: Normal heart sounds.  Pulmonary:     Effort: Pulmonary effort is normal.     Breath sounds: Normal breath sounds.  Abdominal:     General: Bowel sounds are normal.     Palpations: Abdomen is soft.  Lymphadenopathy:     Cervical: No cervical adenopathy.  Skin:    General: Skin is warm and dry.     Capillary Refill: Capillary refill takes less than 2 seconds.  Neurological:     Mental Status: She is alert and oriented to person, place, and time.  Psychiatric:        Behavior: Behavior normal.      Musculoskeletal Exam: C-spine, thoracic spine, and lumbar spine good ROM.  Shoulder joints, elbow joints, wrist joints, MCPs, PIPs, and DIPs good ROM with no synovitis.  Complete fist formation bilaterally. Mild PIP and DIP synovial thickening.  Hip joints, knee joints, ankle joints, MTPs, PIPs, and DIPs good ROM with no synovitis.  No warmth or effusion of knee joints.  No tenderness or swelling of ankle joints.  Bunion present bilaterally.  No tenderness over trochanteric bursa bilaterally.  Dermal atrophy noted overlying left SI joint s/p cortisone injection.   CDAI Exam: CDAI Score: Not documented Patient Global Assessment: Not documented; Provider Global Assessment: Not documented Swollen: Not documented; Tender: Not documented Joint Exam   Not documented   There is currently no information documented on the homunculus. Go to the Rheumatology activity and complete the homunculus joint exam.  Investigation: No additional findings.  Imaging: Vas [place] Abi With/wo Tbi  Result Date: 01/18/2018 LOWER EXTREMITY DOPPLER STUDY  Indications: Abnormal evaluation by home health practitioner.  Performing Technologist: Maudry Mayhew MHA, RVT, RDCS, RDMS  Examination Guidelines: A complete evaluation includes at minimum, Doppler waveform signals and systolic blood pressure reading at the level of bilateral brachial, anterior tibial, and posterior tibial arteries, when vessel segments are accessible. Bilateral testing is considered an integral part of a complete examination. Photoelectric Plethysmograph (PPG) waveforms and toe systolic pressure readings are included as required and additional duplex testing as needed. Limited examinations for reoccurring indications may be performed as noted.  ABI Findings: +---------+------------------+-----+---------+--------+ Right    Rt Pressure (mmHg)IndexWaveform Comment  +---------+------------------+-----+---------+--------+ Brachial 120                    triphasic         +---------+------------------+-----+---------+--------+ PTA      155               1.25 triphasic         +---------+------------------+-----+---------+--------+ DP       147  1.19 triphasic         +---------+------------------+-----+---------+--------+ Great Toe133               1.07                   +---------+------------------+-----+---------+--------+ +---------+------------------+-----+---------+-------+ Left     Lt Pressure (mmHg)IndexWaveform Comment +---------+------------------+-----+---------+-------+ Brachial 124                    triphasic        +---------+------------------+-----+---------+-------+ PTA      171               1.38 triphasic        +---------+------------------+-----+---------+-------+ DP       132               1.06 triphasic        +---------+------------------+-----+---------+-------+ Great Toe111               0.90                  +---------+------------------+-----+---------+-------+  +-------+-----------+-----------+------------+------------+ ABI/TBIToday's ABIToday's TBIPrevious ABIPrevious TBI +-------+-----------+-----------+------------+------------+ Right  1.25       1.07                                +-------+-----------+-----------+------------+------------+ Left   1.38       0.90                                +-------+-----------+-----------+------------+------------+  Summary: Right: Resting right ankle-brachial index is within normal range. No evidence of significant right lower extremity arterial disease. The right toe-brachial index is normal. Left: Resting left ankle-brachial index is within normal range. No evidence of significant left lower extremity arterial disease. The left toe-brachial index is normal.  *See table(s) above for measurements and observations.  Electronically signed by Shirlee More MD on 01/18/2018 at 5:42:12 PM.   Final     Recent Labs: Lab Results  Component Value Date   WBC 6.2 12/01/2016   HGB 13.8 12/01/2016   PLT 226.0 12/01/2016   NA 144 12/01/2016   K 5.5 (H) 12/01/2016   CL 105 12/01/2016   CO2 29 12/01/2016   GLUCOSE 104 (H) 12/01/2016   BUN 13 12/01/2016   CREATININE 0.89 12/01/2016   BILITOT 0.6 12/01/2016   ALKPHOS 91 12/01/2016   AST 21 12/01/2016   ALT 14 12/01/2016   PROT 8.1 12/01/2016   ALBUMIN 4.3 12/01/2016   CALCIUM 10.5 12/01/2016   GFRAA >60 08/02/2015    Speciality Comments: No specialty comments available.  Procedures:  No procedures performed Allergies: Penicillins; Codeine; and Sulfonamide derivatives   Assessment / Plan:     Visit Diagnoses: Neck pain - The x-ray was unremarkable except for mild C6-7 narrowing.  She has good range of motion on exam.  She is not been experiencing any symptoms of radiculopathy at this time.  DDD (degenerative disc disease), lumbar - MRI 05/09/16: L2-L3 and L3-L4 DDD w/ Schmorl's node& edema at inferior endplate of L3. Diffuse facet arthrosis mild at all  levels.  She has had several epidural injections in the past.  She continues to have chronic lower back pain.  She is not experiencing any symptoms of radiculopathy or sciatica at this time.  Chronic SI joint pain - Left, XR consistent with OA.  She had a  cortisone injection 06/28/17 which was helpful.  Dermal atrophy noted s/p cortisone injection.  Primary osteoarthritis of both hands: She has mild PIP and DIP synovial thickening consistent with osteoarthritis of bilateral hands.  She has complete fist formation bilaterally.  No synovitis was noted.  She has minimal discomfort in both hands.  Joint protection and muscle strengthening were discussed.  Primary osteoarthritis of both knees - She has received visco supplement injections at [facility] in the past.  She continues to have chronic pain in bilateral knee joints.  No warmth or effusion was noted on exam today.  She is good range of motion.  She uses Voltaren gel topically PRN.  She does not need a refill of Voltaren gel at this time.  Other insomnia  Age-related osteoporosis without current pathological fracture - on Prolia SQ. ordered by PCP.  He was treated with Fosamax and Reclast in the past.   Orders: No orders of the defined types were placed in this encounter.  No orders of the defined types were placed in this encounter.     Follow-Up Instructions: Return in about 1 year (around 01/31/2019) for Osteoarthritis, DDD.   Ofilia Neas, PA-C   I examined and evaluated the patient with Hazel Sams PA.  Patient continues to have some lower back pain due to underlying disc disease.  She also has neck discomfort.  She has osteoarthritis which causes chronic discomfort.  She had no synovitis on examination.  The plan of care was discussed as noted above.  Bo Merino, MD  Note - This record has been created using Editor, commissioning.  Chart creation errors have been sought, but may not always  have been located. Such  creation errors do not reflect on  the standard of medical care.

## 2018-01-18 ENCOUNTER — Encounter (HOSPITAL_BASED_OUTPATIENT_CLINIC_OR_DEPARTMENT_OTHER): Payer: Medicare Other

## 2018-01-18 ENCOUNTER — Ambulatory Visit (HOSPITAL_BASED_OUTPATIENT_CLINIC_OR_DEPARTMENT_OTHER)
Admission: RE | Admit: 2018-01-18 | Discharge: 2018-01-18 | Disposition: A | Discharge status: Home / Self Care | Payer: Medicare Other | Source: Ambulatory Visit | Attending: Family Medicine | Admitting: Family Medicine

## 2018-01-18 DIAGNOSIS — I739 Peripheral vascular disease, unspecified: Secondary | ICD-10-CM | POA: Insufficient documentation

## 2018-01-18 NOTE — Progress Notes (Signed)
ABI has been completed. Refer to "CV Proc" under chart review to view preliminary results.  01/18/2018 2:53 PM Maudry Mayhew, MHA, RVT, RDCS, RDMS

## 2018-01-30 ENCOUNTER — Encounter: Payer: Self-pay | Admitting: Rheumatology

## 2018-01-30 ENCOUNTER — Ambulatory Visit (INDEPENDENT_AMBULATORY_CARE_PROVIDER_SITE_OTHER): Payer: Medicare Other | Admitting: Rheumatology

## 2018-01-30 VITALS — BP 118/72 | HR 75 | Resp 12 | Ht 66.0 in | Wt 159.0 lb

## 2018-01-30 DIAGNOSIS — M533 Sacrococcygeal disorders, not elsewhere classified: Secondary | ICD-10-CM

## 2018-01-30 DIAGNOSIS — M170 Bilateral primary osteoarthritis of knee: Secondary | ICD-10-CM

## 2018-01-30 DIAGNOSIS — G8929 Other chronic pain: Secondary | ICD-10-CM

## 2018-01-30 DIAGNOSIS — M19041 Primary osteoarthritis, right hand: Secondary | ICD-10-CM

## 2018-01-30 DIAGNOSIS — M810 Age-related osteoporosis without current pathological fracture: Secondary | ICD-10-CM

## 2018-01-30 DIAGNOSIS — M542 Cervicalgia: Secondary | ICD-10-CM | POA: Diagnosis not present

## 2018-01-30 DIAGNOSIS — G4709 Other insomnia: Secondary | ICD-10-CM

## 2018-01-30 DIAGNOSIS — M5136 Other intervertebral disc degeneration, lumbar region: Secondary | ICD-10-CM | POA: Diagnosis not present

## 2018-01-30 DIAGNOSIS — M19042 Primary osteoarthritis, left hand: Secondary | ICD-10-CM

## 2018-03-07 ENCOUNTER — Encounter: Payer: Self-pay | Admitting: Gastroenterology

## 2018-03-11 ENCOUNTER — Encounter: Payer: Self-pay | Admitting: Gastroenterology

## 2018-04-03 ENCOUNTER — Telehealth: Payer: Self-pay | Admitting: *Deleted

## 2018-04-03 NOTE — Telephone Encounter (Signed)
Covid-19 travel screening questions  Have you traveled in the last 14 days? No If yes where?   Do you now or have you had a fever in the last 14 days? No  Do you have any respiratory symptoms of shortness of breath or cough now or in the last 14 days? No  Do you have a medical history of Congestive Heart Failure? No  Do you have a medical history of lung disease? No  Do you have any family members or close contacts with diagnosed or suspected Covid-19? No        

## 2018-04-04 ENCOUNTER — Encounter: Payer: Self-pay | Admitting: Gastroenterology

## 2018-04-04 ENCOUNTER — Ambulatory Visit (AMBULATORY_SURGERY_CENTER): Payer: Self-pay | Admitting: *Deleted

## 2018-04-04 ENCOUNTER — Other Ambulatory Visit: Payer: Self-pay

## 2018-04-04 VITALS — Ht 66.0 in | Wt 160.0 lb

## 2018-04-04 DIAGNOSIS — Z860100 Personal history of colon polyps, unspecified: Secondary | ICD-10-CM

## 2018-04-04 MED ORDER — NA SULFATE-K SULFATE-MG SULF 17.5-3.13-1.6 GM/177ML PO SOLN: 1.0000 | Freq: Once | ORAL | 0 refills | Status: AC

## 2018-04-04 NOTE — Progress Notes (Signed)
No egg or soy allergy known to patient  No issues with past sedation with any surgeries  or procedures, no intubation problems  No diet pills per patient No home 02 use per patient  No blood thinners per patient  Pt states issues with constipation - uses Mirakax, stool softener and an Herbal supplement - has mostly soft stools but occ hard constipation issues  No A fib or A flutter  EMMI video sent to pt's e mail - declined  2 day Suprep

## 2018-04-11 ENCOUNTER — Telehealth: Payer: Self-pay | Admitting: *Deleted

## 2018-04-11 NOTE — Telephone Encounter (Signed)
Called pt to notify of cancellation of appointment. We will call pt back to reschedule when schedules open

## 2018-04-18 ENCOUNTER — Encounter: Payer: Medicare Other | Admitting: Gastroenterology

## 2018-05-22 ENCOUNTER — Telehealth: Payer: Self-pay | Admitting: *Deleted

## 2018-05-22 NOTE — Telephone Encounter (Signed)
Called patient to reschedule colonoscopy previously cancelled due to Covid-19. Pt has prep. Instructions redone and sent to patient via MyChart.

## 2018-06-02 ENCOUNTER — Telehealth: Payer: Self-pay | Admitting: *Deleted

## 2018-06-02 NOTE — Telephone Encounter (Signed)
Covid-19 travel screening questions  Have you traveled in the last 14 days? No If yes where?  Do you now or have you had a fever in the last 14 days? No  Do you have any respiratory symptoms of shortness of breath or cough now or in the last 14 days? No  Do you have a medical history of Congestive Heart Failure?  Do you have a medical history of lung disease?  Do you have any family members or close contacts with diagnosed or suspected Covid-19? No  Pt made aware of care partner policy and to bring a mask if she has one

## 2018-06-04 ENCOUNTER — Other Ambulatory Visit: Payer: Self-pay

## 2018-06-04 ENCOUNTER — Encounter: Payer: Self-pay | Admitting: Gastroenterology

## 2018-06-04 ENCOUNTER — Ambulatory Visit (AMBULATORY_SURGERY_CENTER): Payer: Medicare Other | Admitting: Gastroenterology

## 2018-06-04 VITALS — BP 133/76 | HR 59 | Temp 97.8°F | Resp 22 | Ht 66.0 in | Wt 160.0 lb

## 2018-06-04 DIAGNOSIS — D120 Benign neoplasm of cecum: Secondary | ICD-10-CM

## 2018-06-04 DIAGNOSIS — D123 Benign neoplasm of transverse colon: Secondary | ICD-10-CM

## 2018-06-04 DIAGNOSIS — Z86010 Personal history of colonic polyps: Secondary | ICD-10-CM

## 2018-06-04 DIAGNOSIS — D122 Benign neoplasm of ascending colon: Secondary | ICD-10-CM

## 2018-06-04 DIAGNOSIS — D128 Benign neoplasm of rectum: Secondary | ICD-10-CM | POA: Diagnosis not present

## 2018-06-04 MED ORDER — SODIUM CHLORIDE 0.9 % IV SOLN: 500.0000 mL | Freq: Once | INTRAVENOUS | Status: DC

## 2018-06-04 NOTE — Progress Notes (Signed)
Called to room to assist during endoscopic procedure.  Patient ID and intended procedure confirmed with present staff. Received instructions for my participation in the procedure from the performing physician.  

## 2018-06-04 NOTE — Op Note (Addendum)
[facility] Patient Name: Charlotte Brooks Procedure Date: 06/04/2018 8:47 AM MRN: [id number] Endoscopist: Mauri Pole , MD Age: 73 Referring MD:  Date of Birth: [birth year] Gender: Female Account #: [id number] Procedure:                Colonoscopy Indications:              High risk colon cancer surveillance: Personal                            history of colonic polyps Medicines:                Monitored Anesthesia Care Procedure:                Pre-Anesthesia Assessment:                           - Prior to the procedure, a History and Physical                            was performed, and patient medications and                            allergies were reviewed. The patient's tolerance of                            previous anesthesia was also reviewed. The risks                            and benefits of the procedure and the sedation                            options and risks were discussed with the patient.                            All questions were answered, and informed consent                            was obtained. Prior Anticoagulants: The patient has                            taken no previous anticoagulant or antiplatelet                            agents. ASA Grade Assessment: II - A patient with                            mild systemic disease. After reviewing the risks                            and benefits, the patient was deemed in                            satisfactory condition to undergo the procedure.  After obtaining informed consent, the colonoscope                            was passed under direct vision. Throughout the                            procedure, the patient's blood pressure, pulse, and                            oxygen saturations were monitored continuously. The                            Colonoscope was introduced through the anus and                            advanced to the the cecum,  identified by                            appendiceal orifice and ileocecal valve. The                            colonoscopy was performed without difficulty. The                            patient tolerated the procedure well. The quality                            of the bowel preparation was good. The ileocecal                            valve, appendiceal orifice, and rectum were                            photographed. Scope In: 8:53:04 AM Scope Out: 9:22:39 AM Scope Withdrawal Time: 0 hours 23 minutes 27 seconds  Total Procedure Duration: 0 hours 29 minutes 35 seconds  Findings:                 The perianal and digital rectal examinations were                            normal.                           Ten sessile polyps were found in the transverse                            colon, hepatic flexure, ascending colon and cecum.                            The polyps were 5 to 9 mm in size. These polyps                            were removed with a cold snare. Resection and  retrieval were complete.                           Two sessile polyps were found in the transverse                            colon and hepatic flexure. The polyps were 1 to 3                            mm in size. These polyps were removed with a cold                            biopsy forceps. Resection and retrieval were                            complete.                           Non-bleeding internal hemorrhoids were found during                            retroflexion. The hemorrhoids were medium-sized. Complications:            No immediate complications. Impression:               - Ten 5 to 9 mm polyps in the transverse colon, at                            the hepatic flexure, in the ascending colon and in                            the cecum, removed with a cold snare. Resected and                            retrieved.                           - Two 1 to 3 mm polyps in the  transverse colon and                            at the hepatic flexure, removed with a cold biopsy                            forceps. Resected and retrieved.                           - Non-bleeding internal hemorrhoids. Recommendation:           - Patient has a contact number available for                            emergencies. The signs and symptoms of potential                            delayed complications were discussed with the  patient. Return to normal activities tomorrow.                            Written discharge instructions were provided to the                            patient.                           - Resume previous diet.                           - Continue present medications.                           - Await pathology results.                           - Repeat colonoscopy in 1 year for surveillance                            based on pathology results. Mauri Pole, MD 06/04/2018 9:34:34 AM This report has been signed electronically.

## 2018-06-04 NOTE — Progress Notes (Signed)
PT taken to PACU. Monitors in place. VSS. Report given to RN. 

## 2018-06-04 NOTE — Progress Notes (Signed)
Covid screen and temp per :N. Megan Salon, LPN

## 2018-06-04 NOTE — Patient Instructions (Signed)
Handouts given for hemorrhoids and polyps   YOU HAD AN ENDOSCOPIC PROCEDURE TODAY AT THE [facility]:   Refer to the procedure report that was given to you for any specific questions about what was found during the examination.  If the procedure report does not answer your questions, please call your gastroenterologist to clarify.  If you requested that your care partner not be given the details of your procedure findings, then the procedure report has been included in a sealed envelope for you to review at your convenience later.  YOU SHOULD EXPECT: Some feelings of bloating in the abdomen. Passage of more gas than usual.  Walking can help get rid of the air that was put into your GI tract during the procedure and reduce the bloating. If you had a lower endoscopy (such as a colonoscopy or flexible sigmoidoscopy) you may notice spotting of blood in your stool or on the toilet paper. If you underwent a bowel prep for your procedure, you may not have a normal bowel movement for a few days.  Please Note:  You might notice some irritation and congestion in your nose or some drainage.  This is from the oxygen used during your procedure.  There is no need for concern and it should clear up in a day or so.  SYMPTOMS TO REPORT IMMEDIATELY:   Following lower endoscopy (colonoscopy or flexible sigmoidoscopy):  Excessive amounts of blood in the stool  Significant tenderness or worsening of abdominal pains  Swelling of the abdomen that is new, acute  Fever of 100F or higher  For urgent or emergent issues, a gastroenterologist can be reached at any hour by calling [phone].   DIET:  We do recommend a small meal at first, but then you may proceed to your regular diet.  Drink plenty of fluids but you should avoid alcoholic beverages for 24 hours.  ACTIVITY:  You should plan to take it easy for the rest of today and you should NOT DRIVE or use heavy machinery until tomorrow (because of the  sedation medicines used during the test).    FOLLOW UP: Our staff will call the number listed on your records 48-72 hours following your procedure to check on you and address any questions or concerns that you may have regarding the information given to you following your procedure. If we do not reach you, we will leave a message.  We will attempt to reach you two times.  During this call, we will ask if you have developed any symptoms of COVID 19. If you develop any symptoms (for example fever, flu-like symptoms, shortness of breath, cough etc.) before then, please call [phone].  If any biopsies were taken you will be contacted by phone or by letter within the next 1-3 weeks.  Please call us at [phone] if you have not heard about the biopsies in 3 weeks.    SIGNATURES/CONFIDENTIALITY: You and/or your care partner have signed paperwork which will be entered into your electronic medical record.  These signatures attest to the fact that that the information above on your After Visit Summary has been reviewed and is understood.  Full responsibility of the confidentiality of this discharge information lies with you and/or your care-partner. 

## 2018-06-06 ENCOUNTER — Telehealth: Payer: Self-pay

## 2018-06-06 NOTE — Telephone Encounter (Signed)
  Follow up Call-  Call back number 06/04/2018 12/29/2016  Post procedure Call Back phone  # [phone]  Permission to leave phone message Yes Yes  Some recent data might be hidden     Patient questions:  Do you have a fever, pain , or abdominal swelling? No. Pain Score  0 *  Have you tolerated food without any problems? Yes.    Have you been able to return to your normal activities? Yes.    Do you have any questions about your discharge instructions: Diet   No. Medications  No. Follow up visit  No.  Do you have questions or concerns about your Care? No.  Actions: * If pain score is 4 or above:  Have you developed a fever since your procedure? no 2.   Have you had an respiratory symptoms (SOB or cough) since your procedure?no 3.   Have you tested positive for COVID 19 since your procedure no 3.   Have you had any family members/close contacts diagnosed with the COVID 19 since your procedure?  no  If any of these questions are a yes, please inquire if patient has been seen by family doctor and route this note to Joylene John, Therapist, sports.

## 2018-06-11 ENCOUNTER — Encounter: Payer: Self-pay | Admitting: Gastroenterology

## 2018-06-17 ENCOUNTER — Encounter: Payer: Self-pay | Admitting: General Surgery

## 2018-06-17 ENCOUNTER — Telehealth: Payer: Self-pay | Admitting: General Surgery

## 2018-06-17 NOTE — Telephone Encounter (Signed)
Tried to call the patient to pre-screen for televisit on 06/18/2018 left a voicemail to contact the office.

## 2018-06-18 ENCOUNTER — Other Ambulatory Visit: Payer: Self-pay

## 2018-06-18 ENCOUNTER — Encounter: Payer: Self-pay | Admitting: Gastroenterology

## 2018-06-18 ENCOUNTER — Encounter: Payer: Self-pay | Admitting: Genetic Counselor

## 2018-06-18 ENCOUNTER — Ambulatory Visit (INDEPENDENT_AMBULATORY_CARE_PROVIDER_SITE_OTHER): Payer: Medicare Other | Admitting: Gastroenterology

## 2018-06-18 VITALS — Ht 66.0 in | Wt 155.0 lb

## 2018-06-18 DIAGNOSIS — K5904 Chronic idiopathic constipation: Secondary | ICD-10-CM | POA: Diagnosis not present

## 2018-06-18 DIAGNOSIS — D369 Benign neoplasm, unspecified site: Secondary | ICD-10-CM

## 2018-06-18 NOTE — Patient Instructions (Addendum)
Refer to genetic counseling to test for possible gene defects for colon cancer.  History of large adenomatous polyps.  ( You will be contacted with that appointment)   Benefiber 1 tablespoon 2-3 times daily with meals  MiraLAX 1 capful twice daily, titrate based on response to have 1-2 soft bowel movements daily  Continue herbal laxative 1 capsule 3 times daily with meals  Follow-up tele visit in 4 to 6 weeks   I appreciate the  opportunity to care for you  Thank You   Harl Bowie , MD

## 2018-06-18 NOTE — Progress Notes (Signed)
Charlotte Brooks    [id number]    1945-12-19  Primary Care Physician:Lowne Cheri Rous Alferd Apa, DO  Referring Physician: Carollee Herter, Alferd Apa, DO [address] [place], [place] [address]  This service was provided via audio and video telemedicine (Doximity) due to [place] 19 pandemic.  Patient location: Home Provider location: Office Used 2 patient identifiers to confirm the correct person. Explained the limitations in evaluation and management via telemedicine. Patient is aware of potential medical charges for this visit.  Patient consented to this virtual visit.  The persons participating in this telemedicine service were myself and the patient   Chief complaint:  Constipation HPI: 73 year old female with history of chronic constipation for follow-up visit.  Colonoscopy Jun 04, 2018: with removal of 10 sessile adenomatous polyps, ranging in size from 1 to 9 mm, left-sided diverticulosis and melanosis coli  Colonoscopy December 29, 2016: 6 large adenomatous polyps, ranging in size from 3 mm to 20 mm removed.  Left-sided diverticulosis and melanosis coli  Is taking MiraLAX 1 capful twice daily and also taking herbal laxative capsules 3-4 a day  Currently having 1 bowel movement almost daily or every other day  Denies any nausea, vomiting, abdominal pain, melena or bright red blood per rectum    Outpatient Encounter Medications as of 06/18/2018  Medication Sig  . Biotin 5000 MCG CAPS Take 1 capsule by mouth daily.  . Cholecalciferol (VITAMIN D PO) Take 1,000 Units by mouth daily.  Marland Kitchen denosumab (PROLIA) 60 MG/ML SOLN injection Inject 60 mg into the skin every 6 (six) months. Administer in upper arm, thigh, or abdomen  . diclofenac sodium (VOLTAREN) 1 % GEL Apply 3 gm to 3 large joints up to 3 times a day.  . docusate sodium (COLACE) 100 MG capsule Take 100 mg by mouth 2 (two) times daily. 2 daily  . glucosamine-chondroitin 500-400 MG tablet Take 1 tablet  by mouth daily.  Marland Kitchen ibuprofen (ADVIL,MOTRIN) 200 MG tablet Take 800 mg by mouth every 6 (six) hours as needed for fever, headache, mild pain, moderate pain or cramping.  . Misc Natural Products (TART CHERRY ADVANCED PO) Take by mouth.  Marland Kitchen OVER THE COUNTER MEDICATION Swiss Kriss herbal laxative 2-4 daily  . polyethylene glycol powder (GLYCOLAX/MIRALAX) powder USING MEASURING CAP MIX  17GM IN WATER AND DRINK  EVERY MORNING  . polyethylene glycol powder (MIRALAX) powder Take 1 Container by mouth once. 119 grams for a 2 day Suprep colon prep  . vitamin C (ASCORBIC ACID) 500 MG tablet Take 1,000 mg by mouth daily.    No facility-administered encounter medications on file as of 06/18/2018.     Allergies as of 06/18/2018 - Review Complete 06/17/2018  Allergen Reaction Noted  . Penicillins Anaphylaxis and Swelling 07/22/2007  . Codeine Nausea And Vomiting 07/22/2007  . Sulfonamide derivatives Rash 07/22/2007    Past Medical History:  Diagnosis Date  . Arthritis   . Constipation   . Osteoporosis    Gets Reclast    Past Surgical History:  Procedure Laterality Date  . COLONOSCOPY    . HERNIA REPAIR  2/08  . KNEE ARTHROPLASTY     lateral release   . KNEE SURGERY    . POLYPECTOMY      Family History  Problem Relation Age of Onset  . Stroke Mother   . Hypertension Mother   . Diabetes Brother   . Heart disease Brother   . Coronary  artery disease Other   . Diabetes Maternal Grandmother   . Diabetes Maternal Grandfather   . Colon cancer Neg Hx   . Esophageal cancer Neg Hx   . Pancreatic cancer Neg Hx   . Rectal cancer Neg Hx   . Stomach cancer Neg Hx   . Colon polyps Neg Hx     Social History   Socioeconomic History  . Marital status: Married    Spouse name: [name]  . Number of children: 4  . Years of education: Not on file  . Highest education level: Not on file  Occupational History  . Occupation: retired  Scientific laboratory technician  . Financial resource strain: Not on file  . Food  insecurity:    Worry: Not on file    Inability: Not on file  . Transportation needs:    Medical: Not on file    Non-medical: Not on file  Tobacco Use  . Smoking status: Never Smoker  . Smokeless tobacco: Never Used  Substance and Sexual Activity  . Alcohol use: No    Frequency: Never    Comment: OCC WINE1-2 yearly  . Drug use: Never  . Sexual activity: Yes    Partners: Male  Lifestyle  . Physical activity:    Days per week: Not on file    Minutes per session: Not on file  . Stress: Not on file  Relationships  . Social connections:    Talks on phone: Not on file    Gets together: Not on file    Attends religious service: Not on file    Active member of club or organization: Not on file    Attends meetings of clubs or organizations: Not on file    Relationship status: Not on file  . Intimate partner violence:    Fear of current or ex partner: Not on file    Emotionally abused: Not on file    Physically abused: Not on file    Forced sexual activity: Not on file  Other Topics Concern  . Not on file  Social History Narrative   Exercise-- water aerobics, yoga, machines      Review of systems: Review of Systems as per HPI All other systems reviewed and are negative.   Physical Exam: Vitals were not taken and physical exam was not performed during this virtual visit.  Data Reviewed:  Reviewed labs, radiology imaging, old records and pertinent past GI work up   Assessment and Plan/Recommendations:  73 year old female with history of chronic constipation and multiple adenomatous polyps removed in December 2018 and May 2020  ?  Attenuated FAP Refer to genetic counseling to test for possible gene defects for colon cancer.    Benefiber 1 tablespoon 2-3 times daily with meals  MiraLAX 1 capful twice daily, titrate based on response to have 1-2 soft bowel movements daily  Continue herbal laxative 1 capsule 3 times daily with meals  Follow-up tele visit in 4 to 6  weeks      K. Denzil Magnuson , MD   CC: Carollee Herter, Alferd Apa, *

## 2018-06-25 ENCOUNTER — Encounter: Payer: Self-pay | Admitting: Gastroenterology

## 2018-07-02 LAB — HM MAMMOGRAPHY

## 2018-07-09 ENCOUNTER — Ambulatory Visit (INDEPENDENT_AMBULATORY_CARE_PROVIDER_SITE_OTHER): Payer: Medicare Other

## 2018-07-09 ENCOUNTER — Other Ambulatory Visit: Payer: Self-pay

## 2018-07-09 ENCOUNTER — Telehealth: Payer: Self-pay | Admitting: Family Medicine

## 2018-07-09 DIAGNOSIS — M810 Age-related osteoporosis without current pathological fracture: Secondary | ICD-10-CM | POA: Diagnosis not present

## 2018-07-09 MED ORDER — DENOSUMAB 60 MG/ML ~~LOC~~ SOSY
60.0000 mg | PREFILLED_SYRINGE | Freq: Once | SUBCUTANEOUS | Status: AC
  Administered 2018-07-09: 09:00:00 60 mg via SUBCUTANEOUS

## 2018-07-09 NOTE — Telephone Encounter (Signed)
Pt came in office stating is due for Bone Density test, pt would like to have order to be put in for her to get it done. (pt would like to be referral to [facility]) Pt tel [phone].

## 2018-07-09 NOTE — Progress Notes (Addendum)
Pre visit review using our clinic review tool, if applicable. No additional management support is needed unless otherwise documented below in the visit note.  Patient is here today for prolia injection. Prolia injected in to patients right arm SQ. Patient tolerated well.    Ann Held, DO

## 2018-07-17 NOTE — Telephone Encounter (Signed)
Ok to put referral in 

## 2018-07-17 NOTE — Telephone Encounter (Signed)
Please advise 

## 2018-07-18 NOTE — Telephone Encounter (Signed)
Referral placed for Dexa at [facility]. Gwen please check to see if the referral was places correctly. Patient wants to go to [facility]

## 2018-07-29 ENCOUNTER — Telehealth: Payer: Self-pay | Admitting: Genetic Counselor

## 2018-07-29 NOTE — Telephone Encounter (Signed)
Returned call re message received to cancell 7/15 appointment. Left message confirming cancellation and asked patient to contact office to reschedule.

## 2018-07-31 ENCOUNTER — Inpatient Hospital Stay: Payer: Medicare Other

## 2018-07-31 ENCOUNTER — Inpatient Hospital Stay: Payer: Medicare Other | Admitting: Genetic Counselor

## 2018-08-07 ENCOUNTER — Encounter: Payer: Self-pay | Admitting: *Deleted

## 2018-08-13 ENCOUNTER — Encounter: Payer: Self-pay | Admitting: Gastroenterology

## 2018-08-13 ENCOUNTER — Ambulatory Visit (INDEPENDENT_AMBULATORY_CARE_PROVIDER_SITE_OTHER): Payer: Medicare Other | Admitting: Gastroenterology

## 2018-08-13 VITALS — Ht 66.0 in | Wt 152.0 lb

## 2018-08-13 DIAGNOSIS — K5904 Chronic idiopathic constipation: Secondary | ICD-10-CM

## 2018-08-13 DIAGNOSIS — Z86010 Personal history of colonic polyps: Secondary | ICD-10-CM

## 2018-08-13 NOTE — Progress Notes (Signed)
Charlotte Brooks    [id number]    Feb 06, 1945  Primary Care Physician:Lowne Cheri Rous Alferd Apa, DO  Referring Physician: Carollee Herter, Alferd Apa, DO [address] [place],  [place] [address]  This service was provided via  telemedicine due to [place] 19 pandemic.  I connected with@ on 08/13/18 at  8:45 AM EDT by a video enabled telemedicine application and verified that I am speaking with the correct person using two identifiers.  Patient location: Home Provider location: Office   I discussed the limitations, risks, security and privacy concerns of performing an evaluation and management service by video enabled telemedicine application and the availability of in person appointments. I also discussed with the patient that there may be a patient responsible charge related to this service. The patient expressed understanding and agreed to proceed.   The persons participating in this telemedicine service were myself and the patient  Interactive audio and video telecommunications were attempted between this provider and patient, however failed, due to patient having technical difficulties OR patient did not have access to video capability. We continued and completed visit with audio only.    Chief complaint:  Constipation  HPI:  73 year old with history of chronic constipation and multiple sessile right side polyps here for follow-up visit  Constipation improved with MiraLAX, Benefiber and herbal laxative capsule twice daily.  He is having regular bowel movement once daily on most days.  If she skips or goes off her bowel regimen, constipation recurs.  She has not seen genetic counselor yet  Denies any nausea, vomiting, abdominal pain, melena or bright red blood per rectum   Relevant GI Hx: Colonoscopy Jun 04, 2018: with removal of 10 sessile adenomatous polyps, ranging in size from 1 to 9 mm, left-sided diverticulosis and melanosis coli  Colonoscopy  December 29, 2016: 6 large adenomatous polyps, ranging in size from 3 mm to 20 mm removed. Left-sided diverticulosis and melanosis coli   Outpatient Encounter Medications as of 08/13/2018  Medication Sig  . Biotin 5000 MCG CAPS Take 1 capsule by mouth daily.  . Cholecalciferol (VITAMIN D PO) Take 1,000 Units by mouth daily.  Marland Kitchen denosumab (PROLIA) 60 MG/ML SOLN injection Inject 60 mg into the skin every 6 (six) months. Administer in upper arm, thigh, or abdomen  . diclofenac sodium (VOLTAREN) 1 % GEL Apply 3 gm to 3 large joints up to 3 times a day.  . docusate sodium (COLACE) 100 MG capsule Take 100 mg by mouth 2 (two) times daily. 2 daily  . glucosamine-chondroitin 500-400 MG tablet Take 1 tablet by mouth daily.  . Misc Natural Products (TART CHERRY ADVANCED PO) Take by mouth.  Marland Kitchen OVER THE COUNTER MEDICATION Swiss Kriss herbal laxative 2-4 daily  . polyethylene glycol powder (GLYCOLAX/MIRALAX) powder USING MEASURING CAP MIX  17GM IN WATER AND DRINK  EVERY MORNING  . vitamin C (ASCORBIC ACID) 500 MG tablet Take 1,000 mg by mouth daily.   . Wheat Dextrin (BENEFIBER PO) Take by mouth. Two tablespoons twice a day   No facility-administered encounter medications on file as of 08/13/2018.     Allergies as of 08/13/2018 - Review Complete 08/13/2018  Allergen Reaction Noted  . Penicillins Anaphylaxis and Swelling 07/22/2007  . Codeine Nausea And Vomiting 07/22/2007  . Sulfonamide derivatives Rash 07/22/2007    Past Medical History:  Diagnosis Date  . Arthritis   . Constipation   . Osteoporosis    Gets  Reclast    Past Surgical History:  Procedure Laterality Date  . COLONOSCOPY    . HERNIA REPAIR  2/08  . KNEE ARTHROPLASTY     lateral release   . KNEE SURGERY    . POLYPECTOMY      Family History  Problem Relation Age of Onset  . Stroke Mother   . Hypertension Mother   . Diabetes Brother   . Heart disease Brother   . Coronary artery disease Other   . Diabetes Maternal  Grandmother   . Diabetes Maternal Grandfather   . Colon cancer Neg Hx   . Esophageal cancer Neg Hx   . Pancreatic cancer Neg Hx   . Rectal cancer Neg Hx   . Stomach cancer Neg Hx   . Colon polyps Neg Hx     Social History   Socioeconomic History  . Marital status: Married    Spouse name: [name]  . Number of children: 4  . Years of education: Not on file  . Highest education level: Not on file  Occupational History  . Occupation: retired  Scientific laboratory technician  . Financial resource strain: Not on file  . Food insecurity    Worry: Not on file    Inability: Not on file  . Transportation needs    Medical: Not on file    Non-medical: Not on file  Tobacco Use  . Smoking status: Never Smoker  . Smokeless tobacco: Never Used  Substance and Sexual Activity  . Alcohol use: No    Frequency: Never    Comment: OCC WINE1-2 yearly  . Drug use: Never  . Sexual activity: Yes    Partners: Male  Lifestyle  . Physical activity    Days per week: Not on file    Minutes per session: Not on file  . Stress: Not on file  Relationships  . Social Herbalist on phone: Not on file    Gets together: Not on file    Attends religious service: Not on file    Active member of club or organization: Not on file    Attends meetings of clubs or organizations: Not on file    Relationship status: Not on file  . Intimate partner violence    Fear of current or ex partner: Not on file    Emotionally abused: Not on file    Physically abused: Not on file    Forced sexual activity: Not on file  Other Topics Concern  . Not on file  Social History Narrative   Exercise-- water aerobics, yoga, machines      Review of systems: Review of Systems as per HPI All other systems reviewed and are negative.   Observations/Objective:   Data Reviewed:  Reviewed labs, radiology imaging, old records and pertinent past GI work up   Assessment and Plan/Recommendations: 73 year old with history of  chronic constipation for follow-up  Continue current bowel regimen with MiraLAX 1 capful twice daily, Benefiber 1 tablespoon 2-3 times daily with meals and herbal laxative as needed  History of multiple adenomatous polyps >10 and colonoscopy in 2018 and 2020 ?  Attenuated FAP Follow-up genetic testing Due for recall colonoscopy May 2021    I discussed the assessment and treatment plan with the patient. The patient was provided an opportunity to ask questions and all were answered. The patient agreed with the plan and demonstrated an understanding of the instructions.   The patient was advised to call back or seek an in-person evaluation  if the symptoms worsen or if the condition fails to improve as anticipated.  I provided 14 minutes of non-face-to-face time during this encounter.   Harl Bowie, MD   CC: Carollee Herter, Alferd Apa, *

## 2018-08-13 NOTE — Patient Instructions (Addendum)
Continue current bowel regimen with MiraLAX, Benefiber and herbal supplement  Continue increased daily water intake of 8 to 10 cups  Due for recall colonoscopy May 2021  Follow-up as needed  I appreciate the  opportunity to care for you  Thank You   Harl Bowie , MD

## 2018-08-24 IMAGING — DX DG KNEE COMPLETE 4+V*R*
4 series · 4 of 4 positions shown · non-contrast
Comparison: 08/02/2015 right knee radiograph

CLINICAL DATA: 71 y/o F; right knee injury with pain, initial
encounter.

EXAM:
RIGHT KNEE - COMPLETE 4+ VIEW

[knee ap]
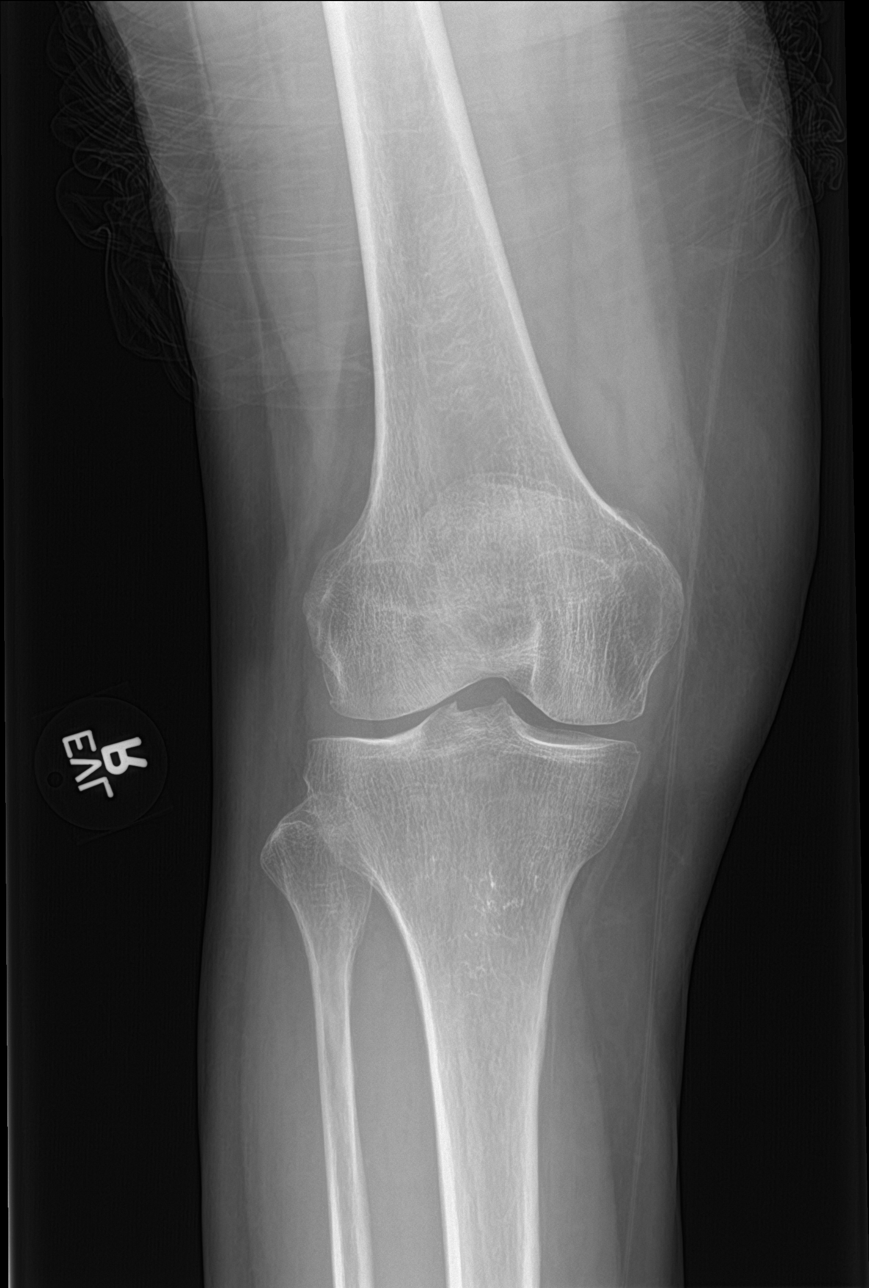

[knee lat]
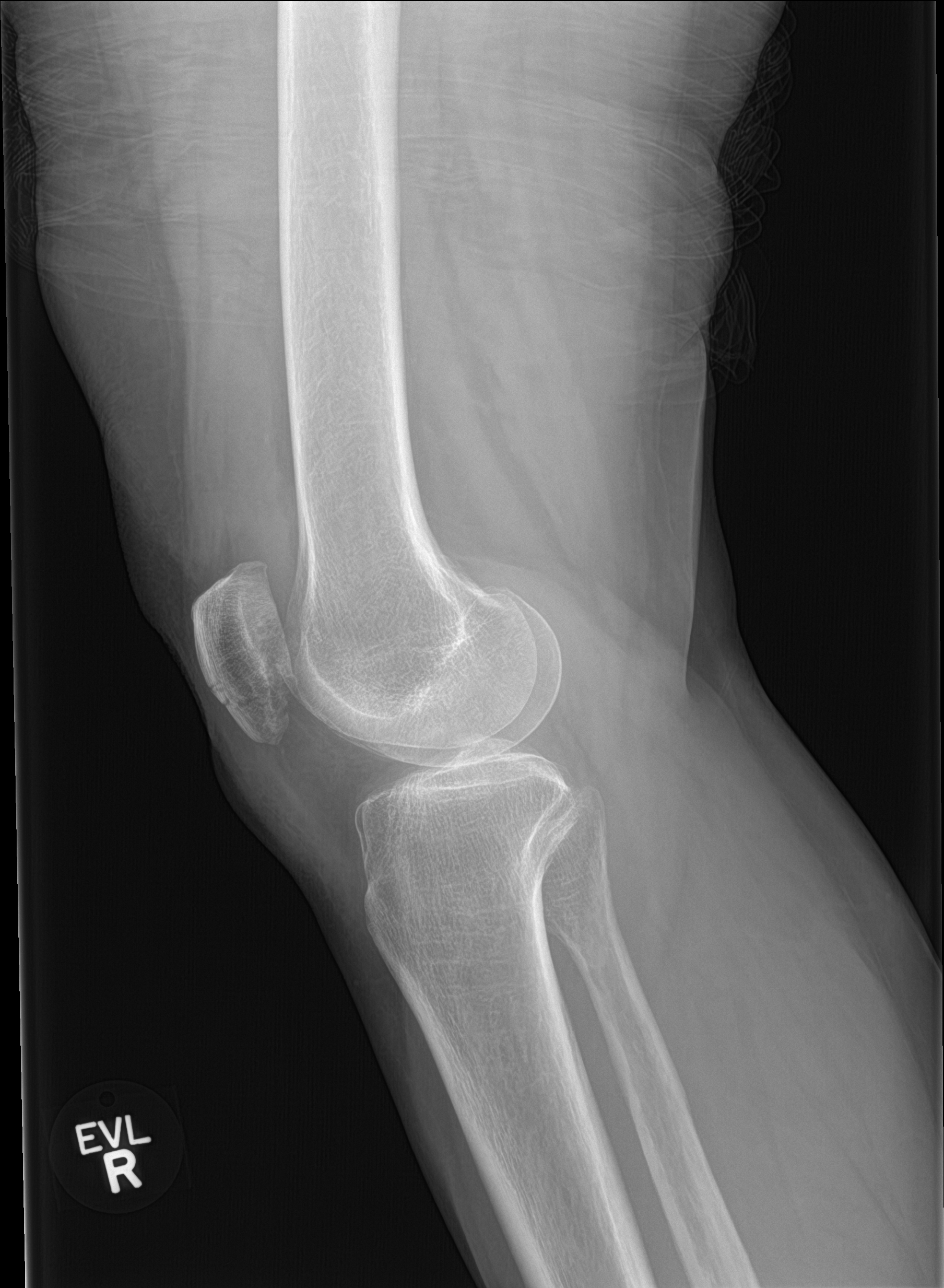

[knee obl (1 of 2)]
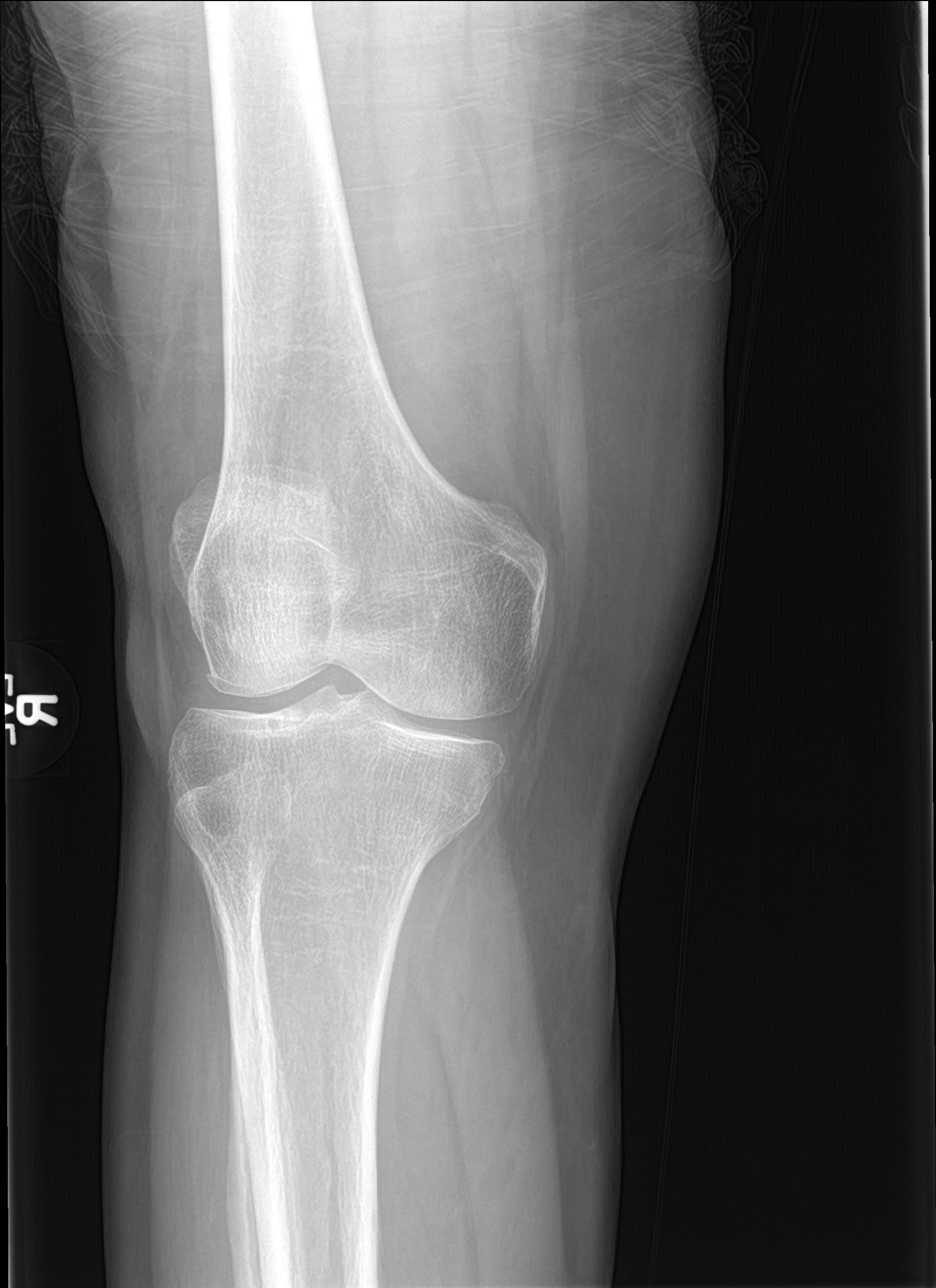

[knee obl (2 of 2)]
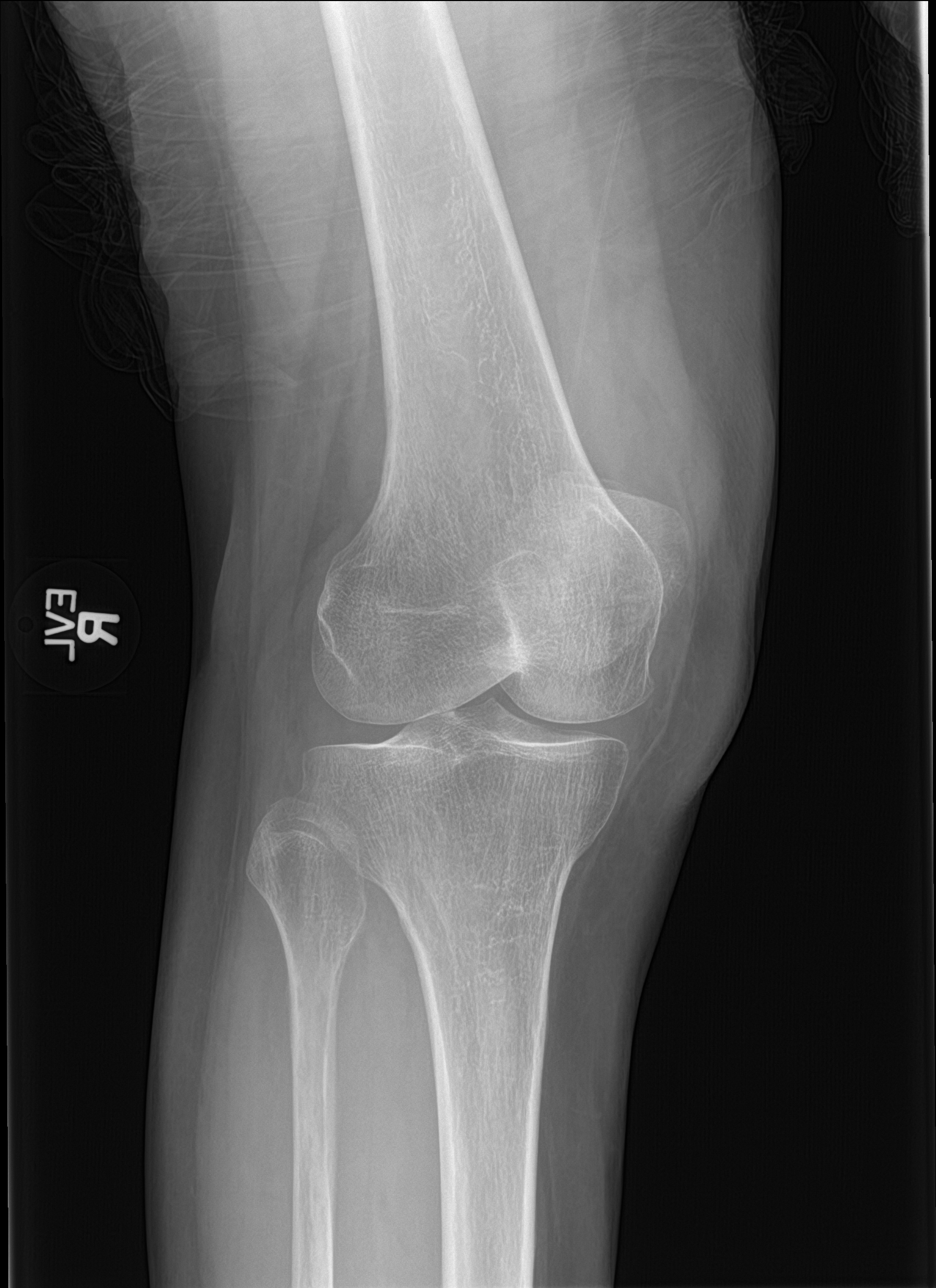

[4 of 4 positions shown; findings below may reference images not displayed]

FINDINGS: Lucency traversing inferior patella likely representing nondisplaced
fracture. Suprapatellar joint effusion. Joint spaces are well
maintained. No other fracture or dislocation identified.
IMPRESSION: Lucency traversing inferior patella likely representing nondisplaced
fracture. Suprapatellar joint effusion.

By: Arshdeep Catalan M.D.

## 2018-09-03 ENCOUNTER — Encounter: Payer: Self-pay | Admitting: Family Medicine

## 2018-09-10 ENCOUNTER — Other Ambulatory Visit: Payer: Self-pay

## 2018-09-10 ENCOUNTER — Encounter: Payer: Self-pay | Admitting: Family Medicine

## 2018-09-10 DIAGNOSIS — M810 Age-related osteoporosis without current pathological fracture: Secondary | ICD-10-CM

## 2019-01-08 ENCOUNTER — Ambulatory Visit: Payer: Medicare Other

## 2019-01-09 ENCOUNTER — Ambulatory Visit (INDEPENDENT_AMBULATORY_CARE_PROVIDER_SITE_OTHER): Payer: Medicare Other | Admitting: *Deleted

## 2019-01-09 ENCOUNTER — Other Ambulatory Visit: Payer: Self-pay

## 2019-01-09 DIAGNOSIS — M810 Age-related osteoporosis without current pathological fracture: Secondary | ICD-10-CM

## 2019-01-09 MED ORDER — DENOSUMAB 60 MG/ML ~~LOC~~ SOSY
60.0000 mg | PREFILLED_SYRINGE | Freq: Once | SUBCUTANEOUS | Status: AC
  Administered 2019-01-09: 09:00:00 60 mg via SUBCUTANEOUS

## 2019-01-09 NOTE — Progress Notes (Addendum)
Patient here for Prolia injection per Dr. Etter Sjogren.  Injection given in right arm and patient tolerated well.

## 2019-01-24 ENCOUNTER — Telehealth: Payer: Self-pay | Admitting: Family Medicine

## 2019-01-24 NOTE — Telephone Encounter (Signed)
Spoke with Mrs. Reser regarding AWV. Patient declined at this time. Patient stated [employer] comes to the home to complete visit. SF

## 2019-01-29 ENCOUNTER — Ambulatory Visit: Payer: Self-pay | Admitting: Rheumatology

## 2019-03-03 ENCOUNTER — Ambulatory Visit: Payer: Medicare Other | Attending: Internal Medicine

## 2019-03-03 ENCOUNTER — Other Ambulatory Visit: Payer: Self-pay

## 2019-03-03 DIAGNOSIS — Z23 Encounter for immunization: Secondary | ICD-10-CM

## 2019-03-03 NOTE — Progress Notes (Signed)
   Covid-19 Vaccination Clinic  Name:  Charlotte Brooks    MRN: [id number] DOB: 09-30-1945  03/03/2019  Charlotte Brooks was observed post Covid-19 immunization for 30 minutes based on pre-vaccination screening without incidence. She was provided with Vaccine Information Sheet and instruction to access the V-Safe system.   Charlotte Brooks was instructed to call 911 with any severe reactions post vaccine: Marland Kitchen Difficulty breathing  . Swelling of your face and throat  . A fast heartbeat  . A bad rash all over your body  . Dizziness and weakness    Immunizations Administered    Name Date Dose VIS Date Route   Moderna COVID-19 Vaccine 03/03/2019 10:06 AM 0.5 mL 12/17/2018 Intramuscular   Manufacturer: Moderna   Lot: [id number]   [id number]

## 2019-03-13 NOTE — Progress Notes (Signed)
Office Visit Note  Patient: Charlotte Brooks             Date of Birth: 1945/09/04           MRN: [id number]             PCP: Ann Held, DO Referring: Ann Held, * Visit Date: 03/19/2019 Occupation: @GUAROCC @  Subjective:  Left SI joint pain   History of Present Illness: Charlotte Brooks is a 74 y.o. female with history of osteoarthritis and DDD.  She presents today with left SI joint pain.  She states her discomfort has progressively been getting worse over the past several months.  She had a cortisone injection and left SI joint on 06/28/2017 which provided significant and lasting pain relief.  She denies any midline lower back pain at this time.  She states that she has occasional discomfort in her neck but denies any symptoms of radiculopathy.  She has been performing resistive exercises 3 times a week, yoga, and walking for exercise on a regular basis.  She states she has occasional pain in both knee joints but denies any joint swelling.  She denies any pain in her hands at this time.  She uses Voltaren gel topically as needed for pain relief. She is on Prolia 60 mg subcutaneous injections every 6 months ordered by her PCP.  She continues to take vitamin D 1000 units by mouth daily.    Activities of Daily Living:  Patient reports morning stiffness for several hours.   Patient Reports nocturnal pain.  Difficulty dressing/grooming: Denies Difficulty climbing stairs: Denies Difficulty getting out of chair: Denies Difficulty using hands for taps, buttons, cutlery, and/or writing: Denies  Review of Systems  Constitutional: Negative for fatigue.  HENT: Negative for mouth sores, mouth dryness and nose dryness.   Eyes: Negative for pain, itching, visual disturbance and dryness.  Respiratory: Negative for cough, hemoptysis, shortness of breath and difficulty breathing.   Cardiovascular: Negative for chest pain, palpitations, hypertension and swelling in  legs/feet.  Gastrointestinal: Negative for blood in stool, constipation and diarrhea.  Endocrine: Negative for increased urination.  Genitourinary: Negative for difficulty urinating and painful urination.  Musculoskeletal: Positive for arthralgias, joint pain and morning stiffness. Negative for joint swelling, myalgias, muscle weakness, muscle tenderness and myalgias.  Skin: Negative for color change, pallor, rash, hair loss, nodules/bumps, skin tightness, ulcers and sensitivity to sunlight.  Allergic/Immunologic: Negative for susceptible to infections.  Neurological: Negative for dizziness, numbness, headaches, memory loss and weakness.  Hematological: Negative for swollen glands.  Psychiatric/Behavioral: Negative for depressed mood, confusion and sleep disturbance. The patient is not nervous/anxious.     PMFS History:  Patient Active Problem List   Diagnosis Date Noted  . DDD (degenerative disc disease), lumbar 06/28/2017  . Chronic SI joint pain 06/28/2017  . Primary osteoarthritis of both hands 06/28/2017  . Primary osteoarthritis of both knees 06/28/2017  . 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 DEPRESSION 05/31/2006    Past Medical History:  Diagnosis Date  . Arthritis   . Constipation   . Osteoporosis    Gets Reclast    Family History  Problem Relation Age of Onset  . Stroke Mother   . Hypertension Mother   . Diabetes Brother   . Heart disease Brother   . Coronary artery disease Other   . Diabetes Maternal Grandmother   . Diabetes Maternal Grandfather   . Colon cancer  Neg Hx   . Esophageal cancer Neg Hx   . Pancreatic cancer Neg Hx   . Rectal cancer Neg Hx   . Stomach cancer Neg Hx   . Colon polyps Neg Hx    Past Surgical History:  Procedure Laterality Date  . COLONOSCOPY    . HERNIA REPAIR  2/08  . KNEE ARTHROPLASTY     lateral release   . KNEE SURGERY    . POLYPECTOMY     Social History    Social History Narrative   Exercise-- water aerobics, yoga, machines   Immunization History  Administered Date(s) Administered  . Moderna SARS-COVID-2 Vaccination 03/03/2019  . Zoster 05/01/2007     Objective: Vital Signs: BP 132/69 (BP Location: Right Arm, Patient Position: Sitting, Cuff Size: Normal)   Pulse 60   Resp 14   Ht 5\' 6"  (1.676 m)   Wt 161 lb (73 kg)   BMI 25.99 kg/m    Physical Exam Vitals and nursing note reviewed.  Constitutional:      Appearance: She is well-developed.  HENT:     Head: Normocephalic and atraumatic.  Eyes:     Conjunctiva/sclera: Conjunctivae normal.  Cardiovascular:     Heart sounds: Normal heart sounds.  Pulmonary:     Effort: Pulmonary effort is normal.  Abdominal:     General: Bowel sounds are normal.     Palpations: Abdomen is soft.  Musculoskeletal:     Cervical back: Normal range of motion.  Lymphadenopathy:     Cervical: No cervical adenopathy.  Skin:    General: Skin is warm and dry.     Capillary Refill: Capillary refill takes less than 2 seconds.  Neurological:     Mental Status: She is alert and oriented to person, place, and time.  Psychiatric:        Behavior: Behavior normal.      Musculoskeletal Exam: C-spine, thoracic spine, lumbar spine good range of motion.  No midline spinal tenderness.  She has tenderness over the left SI joint.  Shoulder joints, elbow joints, wrist joints, MCPs, PIPs and DIPs good range of motion with no synovitis.  She has PIP and DIP synovial thickening consistent with osteoarthritis of both hands.  She has complete fist formation bilaterally.  Joints have good range of motion with no discomfort at this time.  Knee joints have good range of motion with no warmth or effusion.  Ankle joints have good range of motion no tenderness or inflammation.  CDAI Exam: CDAI Score: -- Patient Global: --; Provider Global: -- Swollen: --; Tender: -- Joint Exam 03/19/2019   No joint exam has been  documented for this visit   There is currently no information documented on the homunculus. Go to the Rheumatology activity and complete the homunculus joint exam.  Investigation: No additional findings.  Imaging: No results found.  Recent Labs: Lab Results  Component Value Date   WBC 6.2 12/01/2016   HGB 13.8 12/01/2016   PLT 226.0 12/01/2016   NA 144 12/01/2016   K 5.5 (H) 12/01/2016   CL 105 12/01/2016   CO2 29 12/01/2016   GLUCOSE 104 (H) 12/01/2016   BUN 13 12/01/2016   CREATININE 0.89 12/01/2016   BILITOT 0.6 12/01/2016   ALKPHOS 91 12/01/2016   AST 21 12/01/2016   ALT 14 12/01/2016   PROT 8.1 12/01/2016   ALBUMIN 4.3 12/01/2016   CALCIUM 10.5 12/01/2016   GFRAA >60 08/02/2015    Speciality Comments: No specialty comments available.  Procedures:  Sacroiliac Joint Inj on 03/19/2019 10:16 AM Indications: pain Details: 27 G 1.5 in needle, posterior approach Medications: 1 mL lidocaine 1 %; 40 mg triamcinolone acetonide 40 MG/ML Aspirate: 0 mL Outcome: tolerated well, no immediate complications Procedure, treatment alternatives, risks and benefits explained, specific risks discussed. Consent was given by the patient. Immediately prior to procedure a time out was called to verify the correct patient, procedure, equipment, support staff and site/side marked as required. Patient was prepped and draped in the usual sterile fashion.     Allergies: Penicillins, Codeine, and Sulfonamide derivatives   Assessment / Plan:     Visit Diagnoses: Neck pain - The x-ray was unremarkable except for mild C6-7 narrowing.  She has occasional discomfort in her C-spine.  She is good range of motion on exam today.  No symptoms of radiculopathy.  DDD (degenerative disc disease), lumbar - MRI 05/09/16: L2-L3 and L3-L4 DDD w/ Schmorl's node& edema at inferior endplate of L3. Diffuse facet arthrosis mild at all levels.  She has good range of motion of the lumbar spine exam.  No midline spinal  tenderness at this time.  She is not experiencing any symptoms of radiculopathy.  Chronic SI joint pain - Left, XR consistent with OA.  She has tenderness over the left SI joint.  She has been experiencing increased discomfort in the left SI joint recently.  She had a left SI joint cortisone injection on 06/28/17, which provided significant pain relief.  She requested a cortisone injection today.  She tolerated the procedure well.  Aftercare was discussed.  The procedure note was completed above.  Primary osteoarthritis of both hands: She has PIP and DIP synovial thickening consistent with osteoarthritis of both hands.  No tenderness or synovitis noted.  She has complete fist formation bilaterally.  Joint protection and muscle strengthening were discussed.   Primary osteoarthritis of both knees - She has received visco supplement injections at [facility] in the past.  She has good ROM of both knee joints with no discomfort.  No warmth or effusion of knee joints noted.  She experiences occasional discomfort in both knee joints, but she remains active performing resistive exercises 3 times a week, yoga, and walking daily.  She can continue to use Voltaren gel topically as needed for pain relief.  Other insomnia: She has been sleeping well at night.   Age-related osteoporosis without current pathological fracture - She is on Prolia 60 mg sq injections every 6 months ordered by PCP.  She is taking vitamin D 1,000 units daily.  He was treated with Fosamax and Reclast in the past. She performs resistive exercises 3 times a week and performs yoga and walks on a regular basis.   Orders: Orders Placed This Encounter  Procedures  . Sacroiliac Joint Inj   No orders of the defined types were placed in this encounter.   Follow-Up Instructions: Return in about 6 months (around 09/19/2019) for Osteoarthritis, DDD.   Ofilia Neas, PA-C   I examined and evaluated the patient with Hazel Sams PA.   Patient continues to have some discomfort in her cervical lumbar and SI joints.  She does have underlying osteoarthritis.  She was having ongoing discomfort in her left SI joint.  After different treatment options were discussed and per her request left SI joint was injected with cortisone as described above.  No synovitis was noted on examination.  The plan of care was discussed as noted above.  Bo Merino, MD  Note -  This record has been created using [employer].  Chart creation errors have been sought, but may not always  have been located. Such creation errors do not reflect on  the standard of medical care.

## 2019-03-19 ENCOUNTER — Other Ambulatory Visit: Payer: Self-pay

## 2019-03-19 ENCOUNTER — Encounter: Payer: Self-pay | Admitting: Rheumatology

## 2019-03-19 ENCOUNTER — Ambulatory Visit (INDEPENDENT_AMBULATORY_CARE_PROVIDER_SITE_OTHER): Payer: Medicare Other | Admitting: Rheumatology

## 2019-03-19 VITALS — BP 132/69 | HR 60 | Resp 14 | Ht 66.0 in | Wt 161.0 lb

## 2019-03-19 DIAGNOSIS — M19041 Primary osteoarthritis, right hand: Secondary | ICD-10-CM

## 2019-03-19 DIAGNOSIS — M533 Sacrococcygeal disorders, not elsewhere classified: Secondary | ICD-10-CM | POA: Diagnosis not present

## 2019-03-19 DIAGNOSIS — M5136 Other intervertebral disc degeneration, lumbar region: Secondary | ICD-10-CM

## 2019-03-19 DIAGNOSIS — M810 Age-related osteoporosis without current pathological fracture: Secondary | ICD-10-CM

## 2019-03-19 DIAGNOSIS — M542 Cervicalgia: Secondary | ICD-10-CM

## 2019-03-19 DIAGNOSIS — M19042 Primary osteoarthritis, left hand: Secondary | ICD-10-CM

## 2019-03-19 DIAGNOSIS — M170 Bilateral primary osteoarthritis of knee: Secondary | ICD-10-CM

## 2019-03-19 DIAGNOSIS — G8929 Other chronic pain: Secondary | ICD-10-CM

## 2019-03-19 DIAGNOSIS — G4709 Other insomnia: Secondary | ICD-10-CM

## 2019-04-01 ENCOUNTER — Ambulatory Visit: Payer: Medicare Other | Attending: Internal Medicine

## 2019-04-01 DIAGNOSIS — Z23 Encounter for immunization: Secondary | ICD-10-CM

## 2019-04-01 NOTE — Progress Notes (Signed)
   Covid-19 Vaccination Clinic  Name:  Charlotte Brooks    MRN: [id number] DOB: 11/02/1945  04/01/2019  Charlotte Brooks was observed post Covid-19 immunization for 30 minutes based on pre-vaccination screening without incident. She was provided with Vaccine Information Sheet and instruction to access the V-Safe system.   Charlotte Brooks was instructed to call 911 with any severe reactions post vaccine: Marland Kitchen Difficulty breathing  . Swelling of face and throat  . A fast heartbeat  . A bad rash all over body  . Dizziness and weakness   Immunizations Administered    Name Date Dose VIS Date Route   Moderna COVID-19 Vaccine 04/01/2019  9:43 AM 0.5 mL 12/17/2018 Intramuscular   Manufacturer: Moderna   Lot: [id number]   [id number]

## 2019-05-19 ENCOUNTER — Other Ambulatory Visit: Payer: Self-pay

## 2019-05-19 ENCOUNTER — Encounter: Payer: Self-pay | Admitting: Family Medicine

## 2019-05-19 ENCOUNTER — Telehealth: Payer: Self-pay | Admitting: Family Medicine

## 2019-05-19 DIAGNOSIS — M5136 Other intervertebral disc degeneration, lumbar region: Secondary | ICD-10-CM

## 2019-05-19 DIAGNOSIS — M810 Age-related osteoporosis without current pathological fracture: Secondary | ICD-10-CM

## 2019-05-19 NOTE — Telephone Encounter (Signed)
That would be okay with me.

## 2019-05-19 NOTE — Telephone Encounter (Signed)
Dr Etter Sjogren is out of the office but I will speak for her and approve the transfer

## 2019-05-19 NOTE — Telephone Encounter (Signed)
Contacted patient for [facility] appointment with Dr. Ethelene Hal unable to lvm.

## 2019-05-19 NOTE — Telephone Encounter (Signed)
Please advise. FYI patient has not been seen in office since 2018.

## 2019-05-19 NOTE — Progress Notes (Signed)
Patient requested order for DEXA scan to be sent to [facility]. Okay to order, per Hazel Sams, PA-C.

## 2019-05-19 NOTE — Telephone Encounter (Signed)
Patient is calling and wanting to [facility] from Dr. Etter Sjogren to Dr. Ethelene Hal. Pls advise. [phone]

## 2019-05-22 LAB — HM DEXA SCAN

## 2019-05-23 ENCOUNTER — Telehealth: Payer: Self-pay | Admitting: *Deleted

## 2019-05-23 NOTE — Telephone Encounter (Signed)
Received DEXA results from [facility]. Reviewed by Hazel Sams, PA-C  Results in Care Everywhere  Date of Scan: 05/22/2019 Lowest T-score and site measured: -1.9 Left Femur Neck Significant changes in BMD and site measured (5% and above): 11% in Lumbar Spine  Current Regimen: On Prolia with PCP and Vitamin D 1,000 units   Recommendation: Continue Current Treatment Regimen per Hazel Sams, PA-C  Patient advised of results and recommendations.

## 2019-06-25 ENCOUNTER — Other Ambulatory Visit: Payer: Self-pay

## 2019-06-26 ENCOUNTER — Encounter: Payer: Self-pay | Admitting: Family Medicine

## 2019-06-26 ENCOUNTER — Telehealth: Payer: Self-pay

## 2019-06-26 ENCOUNTER — Ambulatory Visit (INDEPENDENT_AMBULATORY_CARE_PROVIDER_SITE_OTHER): Payer: Medicare Other | Admitting: Family Medicine

## 2019-06-26 VITALS — BP 116/80 | HR 60 | Temp 97.6°F | Ht 66.0 in | Wt 150.4 lb

## 2019-06-26 DIAGNOSIS — M5136 Other intervertebral disc degeneration, lumbar region: Secondary | ICD-10-CM

## 2019-06-26 DIAGNOSIS — M810 Age-related osteoporosis without current pathological fracture: Secondary | ICD-10-CM | POA: Diagnosis not present

## 2019-06-26 DIAGNOSIS — Z0000 Encounter for general adult medical examination without abnormal findings: Secondary | ICD-10-CM | POA: Diagnosis not present

## 2019-06-26 NOTE — Patient Instructions (Signed)
Health Maintenance, Female Adopting a healthy lifestyle and getting preventive care are important in promoting health and wellness. Ask your health care provider about:  The right schedule for you to have regular tests and exams.  Things you can do on your own to prevent diseases and keep yourself healthy. What should I know about diet, weight, and exercise? Eat a healthy diet   Eat a diet that includes plenty of vegetables, fruits, low-fat dairy products, and lean protein.  Do not eat a lot of foods that are high in solid fats, added sugars, or sodium. Maintain a healthy weight Body mass index (BMI) is used to identify weight problems. It estimates body fat based on height and weight. Your health care provider can help determine your BMI and help you achieve or maintain a healthy weight. Get regular exercise Get regular exercise. This is one of the most important things you can do for your health. Most adults should:  Exercise for at least 150 minutes each week. The exercise should increase your heart rate and make you sweat (moderate-intensity exercise).  Do strengthening exercises at least twice a week. This is in addition to the moderate-intensity exercise.  Spend less time sitting. Even light physical activity can be beneficial. Watch cholesterol and blood lipids Have your blood tested for lipids and cholesterol at 74 years of age, then have this test every 5 years. Have your cholesterol levels checked more often if:  Your lipid or cholesterol levels are high.  You are older than 74 years of age.  You are at high risk for heart disease. What should I know about cancer screening? Depending on your health history and family history, you may need to have cancer screening at various ages. This may include screening for:  Breast cancer.  Cervical cancer.  Colorectal cancer.  Skin cancer.  Lung cancer. What should I know about heart disease, diabetes, and high blood  pressure? Blood pressure and heart disease  High blood pressure causes heart disease and increases the risk of stroke. This is more likely to develop in people who have high blood pressure readings, are of African descent, or are overweight.  Have your blood pressure checked: ? Every 3-5 years if you are 54-62 years of age. ? Every year if you are 93 years old or older. Diabetes Have regular diabetes screenings. This checks your fasting blood sugar level. Have the screening done:  Once every three years after age 69 if you are at a normal weight and have a low risk for diabetes.  More often and at a younger age if you are overweight or have a high risk for diabetes. What should I know about preventing infection? Hepatitis B If you have a higher risk for hepatitis B, you should be screened for this virus. Talk with your health care provider to find out if you are at risk for hepatitis B infection. Hepatitis C Testing is recommended for:  Everyone born from [birth year] through 1965.  Anyone with known risk factors for hepatitis C. Sexually transmitted infections (STIs)  Get screened for STIs, including gonorrhea and chlamydia, if: ? You are sexually active and are younger than 74 years of age. ? You are older than 74 years of age and your health care provider tells you that you are at risk for this type of infection. ? Your sexual activity has changed since you were last screened, and you are at increased risk for chlamydia or gonorrhea. Ask your health care provider if  you are at risk.  Ask your health care provider about whether you are at high risk for HIV. Your health care provider may recommend a prescription medicine to help prevent HIV infection. If you choose to take medicine to prevent HIV, you should first get tested for HIV. You should then be tested every 3 months for as long as you are taking the medicine. Pregnancy  If you are about to stop having your period (premenopausal) and  you may become pregnant, seek counseling before you get pregnant.  Take 400 to 800 micrograms (mcg) of folic acid every day if you become pregnant.  Ask for birth control (contraception) if you want to prevent pregnancy. Osteoporosis and menopause Osteoporosis is a disease in which the bones lose minerals and strength with aging. This can result in bone fractures. If you are 45 years old or older, or if you are at risk for osteoporosis and fractures, ask your health care provider if you should:  Be screened for bone loss.  Take a calcium or vitamin D supplement to lower your risk of fractures.  Be given hormone replacement therapy (HRT) to treat symptoms of menopause. Follow these instructions at home: Lifestyle  Do not use any products that contain nicotine or tobacco, such as cigarettes, e-cigarettes, and chewing tobacco. If you need help quitting, ask your health care provider.  Do not use street drugs.  Do not share needles.  Ask your health care provider for help if you need support or information about quitting drugs. Alcohol use  Do not drink alcohol if: ? Your health care provider tells you not to drink. ? You are pregnant, may be pregnant, or are planning to become pregnant.  If you drink alcohol: ? Limit how much you use to 0-1 drink a day. ? Limit intake if you are breastfeeding.  Be aware of how much alcohol is in your drink. In the [place] one drink equals one 12 oz bottle of beer (355 mL), one 5 oz glass of wine (148 mL), or one 1 oz glass of hard liquor (44 mL). General instructions  Schedule regular health, dental, and eye exams.  Stay current with your vaccines.  Tell your health care provider if: ? You often feel depressed. ? You have ever been abused or do not feel safe at home. Summary  Adopting a healthy lifestyle and getting preventive care are important in promoting health and wellness.  Follow your health care provider's instructions about healthy  diet, exercising, and getting tested or screened for diseases.  Follow your health care provider's instructions on monitoring your cholesterol and blood pressure. This information is not intended to replace advice given to you by your health care provider. Make sure you discuss any questions you have with your health care provider. Document Revised: 12/26/2017 Document Reviewed: 12/26/2017 Elsevier Patient Education  2020 [place] 65 Years and Older, Female Preventive care refers to lifestyle choices and visits with your health care provider that can promote health and wellness. This includes:  A yearly physical exam. This is also called an annual well check.  Regular dental and eye exams.  Immunizations.  Screening for certain conditions.  Healthy lifestyle choices, such as diet and exercise. What can I expect for my preventive care visit? Physical exam Your health care provider will check:  Height and weight. These may be used to calculate body mass index (BMI), which is a measurement that tells if you are at a healthy weight.  Heart rate and blood pressure.  Your skin for abnormal spots. Counseling Your health care provider may ask you questions about:  Alcohol, tobacco, and drug use.  Emotional well-being.  Home and relationship well-being.  Sexual activity.  Eating habits.  History of falls.  Memory and ability to understand (cognition).  Work and work Statistician.  Pregnancy and menstrual history. What immunizations do I need?  Influenza (flu) vaccine  This is recommended every year. Tetanus, diphtheria, and pertussis (Tdap) vaccine  You may need a Td booster every 10 years. Varicella (chickenpox) vaccine  You may need this vaccine if you have not already been vaccinated. Zoster (shingles) vaccine  You may need this after age 82. Pneumococcal conjugate (PCV13) vaccine  One dose is recommended after age 68. Pneumococcal  polysaccharide (PPSV23) vaccine  One dose is recommended after age 35. Measles, mumps, and rubella (MMR) vaccine  You may need at least one dose of MMR if you were born in 1957 or later. You may also need a second dose. Meningococcal conjugate (MenACWY) vaccine  You may need this if you have certain conditions. Hepatitis A vaccine  You may need this if you have certain conditions or if you travel or work in places where you may be exposed to hepatitis A. Hepatitis B vaccine  You may need this if you have certain conditions or if you travel or work in places where you may be exposed to hepatitis B. Haemophilus influenzae type b (Hib) vaccine  You may need this if you have certain conditions. You may receive vaccines as individual doses or as more than one vaccine together in one shot (combination vaccines). Talk with your health care provider about the risks and benefits of combination vaccines. What tests do I need? Blood tests  Lipid and cholesterol levels. These may be checked every 5 years, or more frequently depending on your overall health.  Hepatitis C test.  Hepatitis B test. Screening  Lung cancer screening. You may have this screening every year starting at age 20 if you have a 30-pack-year history of smoking and currently smoke or have quit within the past 15 years.  Colorectal cancer screening. All adults should have this screening starting at age 24 and continuing until age 57. Your health care provider may recommend screening at age 98 if you are at increased risk. You will have tests every 1-10 years, depending on your results and the type of screening test.  Diabetes screening. This is done by checking your blood sugar (glucose) after you have not eaten for a while (fasting). You may have this done every 1-3 years.  Mammogram. This may be done every 1-2 years. Talk with your health care provider about how often you should have regular mammograms.  BRCA-related  cancer screening. This may be done if you have a family history of breast, ovarian, tubal, or peritoneal cancers. Other tests  Sexually transmitted disease (STD) testing.  Bone density scan. This is done to screen for osteoporosis. You may have this done starting at age 61. Follow these instructions at home: Eating and drinking  Eat a diet that includes fresh fruits and vegetables, whole grains, lean protein, and low-fat dairy products. Limit your intake of foods with high amounts of sugar, saturated fats, and salt.  Take vitamin and mineral supplements as recommended by your health care provider.  Do not drink alcohol if your health care provider tells you not to drink.  If you drink alcohol: ? Limit how much you have  to 0-1 drink a day. ? Be aware of how much alcohol is in your drink. In the [place] one drink equals one 12 oz bottle of beer (355 mL), one 5 oz glass of wine (148 mL), or one 1 oz glass of hard liquor (44 mL). Lifestyle  Take daily care of your teeth and gums.  Stay active. Exercise for at least 30 minutes on 5 or more days each week.  Do not use any products that contain nicotine or tobacco, such as cigarettes, e-cigarettes, and chewing tobacco. If you need help quitting, ask your health care provider.  If you are sexually active, practice safe sex. Use a condom or other form of protection in order to prevent STIs (sexually transmitted infections).  Talk with your health care provider about taking a low-dose aspirin or statin. What's next?  Go to your health care provider once a year for a well check visit.  Ask your health care provider how often you should have your eyes and teeth checked.  Stay up to date on all vaccines. This information is not intended to replace advice given to you by your health care provider. Make sure you discuss any questions you have with your health care provider. Document Revised: 12/27/2017 Document Reviewed: 12/27/2017 Elsevier  Patient Education  2020 [employer].

## 2019-06-26 NOTE — Telephone Encounter (Signed)
RB-Pt is a TOC visit from Dr. Carollee Herter to Dr. Mikey College states that her Prolia injections are set up at the other office and need to be transferred to here/states that she is due her next injection on 6.25.21/can you please help with this?thx dmf

## 2019-06-26 NOTE — Progress Notes (Addendum)
Established Patient Office Visit  Subjective:  Patient ID: Charlotte Brooks, female    DOB: 1945/03/13  Age: 74 y.o. MRN: [id number]  CC:  Chief Complaint  Patient presents with  . Transitions Of Care    Patient is here today to transfer care from Dr. Carollee Herter to Dr. Ethelene Hal.  She is due for a PNV-23 but declines at this time.  Colonoscopy is on a 1 year schedule and she is slightly overdue for this but has received a letter and will schedule. Mammogram on a 2 year schedule and next due 2022. She is due her next Prolia on 6.23.21 and a message has been sent to [facility] to help with this.    HPI Charlotte Brooks presents for establishment of care by way of transfer.  Healthy 74 year old immune remains quite active.  She is active on most days of the week between walking or threat friends going to the gym and lifting weights.  She is married.  She does not smoke or use illicit drugs.  She rarely drinks alcohol.  Significant past medical history of osteoporosis.  Currently receiving Prolia therapy and high-dose vitamin D.  Difficult for her to take calcium pills secondary to constipation.  Trying to consume a calcium rich diet.  History of osteoporosis.  Chart review shows normal routine labs.  Yearly colonoscopies secondary to significant polyposis.  She will schedule her repeat colonoscopy for this year.  Regular mammograms and Pap smears through GYN.  Past Medical History:  Diagnosis Date  . Arthritis   . Constipation   . Osteoporosis    Gets Reclast    Past Surgical History:  Procedure Laterality Date  . COLONOSCOPY    . HERNIA REPAIR  2/08  . KNEE ARTHROPLASTY     lateral release   . KNEE SURGERY    . POLYPECTOMY      Family History  Problem Relation Age of Onset  . Stroke Mother   . Hypertension Mother   . Diabetes Brother   . Heart disease Brother   . Coronary artery disease Other   . Diabetes Maternal Grandmother   . Diabetes Maternal Grandfather   . Colon cancer  Neg Hx   . Esophageal cancer Neg Hx   . Pancreatic cancer Neg Hx   . Rectal cancer Neg Hx   . Stomach cancer Neg Hx   . Colon polyps Neg Hx     Social History   Socioeconomic History  . Marital status: Married    Spouse name: [name]  . Number of children: 4  . Years of education: Not on file  . Highest education level: Not on file  Occupational History  . Occupation: retired  Tobacco Use  . Smoking status: Never Smoker  . Smokeless tobacco: Never Used  Vaping Use  . Vaping Use: Never used  Substance and Sexual Activity  . Alcohol use: No    Comment: OCC WINE1-2 yearly  . Drug use: Never  . Sexual activity: Yes    Partners: Male  Other Topics Concern  . Not on file  Social History Narrative   Exercise-- water aerobics, yoga, machines   Social Determinants of Health   Financial Resource Strain:   . Difficulty of Paying Living Expenses:   Food Insecurity:   . Worried About Charity fundraiser in the Last Year:   . Arboriculturist in the Last Year:   Transportation Needs:   . Film/video editor (Medical):   Marland Kitchen  Lack of Transportation (Non-Medical):   Physical Activity:   . Days of Exercise per Week:   . Minutes of Exercise per Session:   Stress:   . Feeling of Stress :   Social Connections:   . Frequency of Communication with Friends and Family:   . Frequency of Social Gatherings with Friends and Family:   . Attends Religious Services:   . Active Member of Clubs or Organizations:   . Attends Archivist Meetings:   Marland Kitchen Marital Status:   Intimate Partner Violence:   . Fear of Current or Ex-Partner:   . Emotionally Abused:   Marland Kitchen Physically Abused:   . Sexually Abused:     Outpatient Medications Prior to Visit  Medication Sig Dispense Refill  . Biotin 5000 MCG CAPS Take 2 capsules by mouth daily.     . Cholecalciferol (VITAMIN D PO) Take 1,000 Units by mouth daily.    Marland Kitchen denosumab (PROLIA) 60 MG/ML SOLN injection Inject 60 mg into the skin every 6  (six) months. Administer in upper arm, thigh, or abdomen 1 Syringe 0  . desonide (DESOWEN) 0.05 % cream Apply 1 application topically 2 (two) times daily.    . diclofenac sodium (VOLTAREN) 1 % GEL Apply 3 gm to 3 large joints up to 3 times a day. 3 Tube 3  . glucosamine-chondroitin 500-400 MG tablet Take 1 tablet by mouth daily.    . Magnesium 400 MG TABS Take by mouth.    . Misc Natural Products (TART CHERRY ADVANCED PO) Take by mouth.    . Omega-3 Fatty Acids (FISH OIL) 1000 MG CAPS Take by mouth.    Marland Kitchen OVER THE COUNTER MEDICATION Swiss Kriss herbal laxative 2-4 daily    . polyethylene glycol powder (GLYCOLAX/MIRALAX) powder USING MEASURING CAP MIX  17GM IN WATER AND DRINK  EVERY MORNING 1581 g 3  . vitamin C (ASCORBIC ACID) 500 MG tablet Take 1,000 mg by mouth daily.     . Wheat Dextrin (BENEFIBER PO) Take by mouth. Two tablespoons twice a day    . clindamycin (CLEOCIN) 300 MG capsule Take 300 mg by mouth every 6 (six) hours.    . docusate sodium (COLACE) 100 MG capsule Take 100 mg by mouth 2 (two) times daily. 2 daily     No facility-administered medications prior to visit.    Allergies  Allergen Reactions  . Penicillins Anaphylaxis and Swelling    Has patient had a PCN reaction causing immediate rash, facial/tongue/throat swelling, SOB or lightheadedness with hypotension: Unknown Has patient had a PCN reaction causing severe rash involving mucus membranes or skin necrosis: Unknown Has patient had a PCN reaction that required hospitalization Unknown Has patient had a PCN reaction occurring within the last 10 years: No  If all of the above answers are "NO", then may proceed with Cephalosporin use.   . Codeine Nausea And Vomiting  . Sulfonamide Derivatives Rash    ROS Review of Systems  Constitutional: Negative.   Respiratory: Negative.   Cardiovascular: Negative.   Gastrointestinal: Negative.   Endocrine: Negative for polyphagia and polyuria.  Genitourinary: Negative.     Musculoskeletal: Negative for gait problem.  Allergic/Immunologic: Negative for immunocompromised state.  Hematological: Does not bruise/bleed easily.  Psychiatric/Behavioral: Negative.       Objective:    Physical Exam Vitals and nursing note reviewed.  Constitutional:      General: She is not in acute distress.    Appearance: Normal appearance. She is normal weight. She is not  ill-appearing, toxic-appearing or diaphoretic.  HENT:     Head: Normocephalic and atraumatic.     Right Ear: Tympanic membrane, ear canal and external ear normal. There is no impacted cerumen.     Left Ear: Tympanic membrane, ear canal and external ear normal. There is no impacted cerumen.     Mouth/Throat:     Mouth: Mucous membranes are moist.     Pharynx: Oropharynx is clear.  Eyes:     General: No scleral icterus.       Right eye: No discharge.        Left eye: No discharge.     Extraocular Movements: Extraocular movements intact.     Conjunctiva/sclera: Conjunctivae normal.     Pupils: Pupils are equal, round, and reactive to light.  Cardiovascular:     Rate and Rhythm: Normal rate and regular rhythm.  Pulmonary:     Effort: Pulmonary effort is normal.     Breath sounds: Normal breath sounds.  Abdominal:     General: Bowel sounds are normal.  Musculoskeletal:     Cervical back: No rigidity or tenderness.  Lymphadenopathy:     Cervical: No cervical adenopathy.  Skin:    General: Skin is warm and dry.  Neurological:     Mental Status: She is alert and oriented to person, place, and time.  Psychiatric:        Mood and Affect: Mood normal.        Behavior: Behavior normal.     BP 116/80 (BP Location: Left Arm, Patient Position: Sitting, Cuff Size: Normal)   Pulse 60   Temp 97.6 F (36.4 C) (Temporal)   Ht 5\' 6"  (1.676 m)   Wt 150 lb 6.4 oz (68.2 kg)   SpO2 100%   BMI 24.28 kg/m  Wt Readings from Last 3 Encounters:  06/26/19 150 lb 6.4 oz (68.2 kg)  03/19/19 161 lb (73 kg)   08/13/18 152 lb (68.9 kg)   The 10-year ASCVD risk score Mikey Bussing DC Jr., et al., 2013) is: 12%   Values used to calculate the score:     Age: 1 years     Sex: Female     Is Non-Hispanic African American: No     Diabetic: No     Tobacco smoker: No     Systolic Blood Pressure: 301 mmHg     Is BP treated: No     HDL Cholesterol: 48.5 mg/dL     Total Cholesterol: 185 mg/dL  Health Maintenance Due  Topic Date Due  . TETANUS/TDAP  Never done  . PNA vac Low Risk Adult (1 of 2 - PCV13) Never done  . DEXA SCAN  11/29/2017  . COLONOSCOPY  06/04/2019    There are no preventive care reminders to display for this patient.  Lab Results  Component Value Date   TSH 2.01 12/01/2016   Lab Results  Component Value Date   WBC 6.6 07/02/2019   HGB 13.3 07/02/2019   HCT 40.3 07/02/2019   MCV 92.5 07/02/2019   PLT 213.0 07/02/2019   Lab Results  Component Value Date   NA 140 07/02/2019   K 4.9 07/02/2019   CO2 28 07/02/2019   GLUCOSE 86 07/02/2019   BUN 18 07/02/2019   CREATININE 0.96 07/02/2019   BILITOT 0.6 07/02/2019   ALKPHOS 73 07/02/2019   AST 24 07/02/2019   ALT 15 07/02/2019   PROT 7.1 07/02/2019   ALBUMIN 4.2 07/02/2019   CALCIUM 9.7 07/02/2019   ANIONGAP  7 08/02/2015   GFR 56.81 (L) 07/02/2019   Lab Results  Component Value Date   CHOL 185 07/02/2019   Lab Results  Component Value Date   HDL 48.50 07/02/2019   Lab Results  Component Value Date   LDLCALC 118 (H) 07/02/2019   Lab Results  Component Value Date   TRIG 93.0 07/02/2019   Lab Results  Component Value Date   CHOLHDL 4 07/02/2019   No results found for: HGBA1C    Assessment & Plan:   Problem List Items Addressed This Visit    None    Visit Diagnoses    Healthcare maintenance    -  Primary   Relevant Orders   CBC (Completed)   Comprehensive metabolic panel (Completed)   Lipid panel (Completed)   Urinalysis, Routine w reflex microscopic (Completed)   Lumbar degenerative disc disease        Osteoporosis, unspecified osteoporosis type, unspecified pathological fracture presence          No orders of the defined types were placed in this encounter.   Follow-up: Return in about 1 year (around 06/25/2020).  Given information on health maintenance and disease prevention.  Encouraged her to continue her healthy lifestyle.  Libby Maw, MD

## 2019-06-26 NOTE — Telephone Encounter (Signed)
Thank you/dmf

## 2019-06-26 NOTE — Telephone Encounter (Signed)
Initiated transfer of services for Prolia injection to [facility]. Summary of Benefits is pending at this time. Once information has been obtained, RN will reach out to the patient to schedule nurse visit appointment.

## 2019-06-27 NOTE — Telephone Encounter (Signed)
Received Summary of Benefits for Prolia injection. No PA is required. The estimated out of pocket expense is $0.  Patient has been made aware of the above info and voices understanding. Nurse visit appointment scheduled for 07/10/19 at 9:00 AM.

## 2019-07-01 ENCOUNTER — Other Ambulatory Visit: Payer: Self-pay

## 2019-07-02 ENCOUNTER — Other Ambulatory Visit (INDEPENDENT_AMBULATORY_CARE_PROVIDER_SITE_OTHER): Payer: Medicare Other

## 2019-07-02 DIAGNOSIS — Z0000 Encounter for general adult medical examination without abnormal findings: Secondary | ICD-10-CM

## 2019-07-02 DIAGNOSIS — R7989 Other specified abnormal findings of blood chemistry: Secondary | ICD-10-CM | POA: Diagnosis not present

## 2019-07-02 LAB — COMPREHENSIVE METABOLIC PANEL
ALT: 15 U/L (ref 0–35)
AST: 24 U/L (ref 0–37)
Albumin: 4.2 g/dL (ref 3.5–5.2)
Alkaline Phosphatase: 73 U/L (ref 39–117)
BUN: 18 mg/dL (ref 6–23)
CO2: 28 mEq/L (ref 19–32)
Calcium: 9.7 mg/dL (ref 8.4–10.5)
Chloride: 105 mEq/L (ref 96–112)
Creatinine, Ser: 0.96 mg/dL (ref 0.40–1.20)
GFR: 56.81 mL/min — ABNORMAL LOW (ref >60.00)
Glucose, Bld: 86 mg/dL (ref 70–99)
Potassium: 4.9 mEq/L (ref 3.5–5.1)
Sodium: 140 mEq/L (ref 135–145)
Total Bilirubin: 0.6 mg/dL (ref 0.2–1.2)
Total Protein: 7.1 g/dL (ref 6.0–8.3)

## 2019-07-02 LAB — URINALYSIS, ROUTINE W REFLEX MICROSCOPIC
Bilirubin Urine: NEGATIVE
Hgb urine dipstick: NEGATIVE
Ketones, ur: NEGATIVE
Leukocytes,Ua: NEGATIVE
Nitrite: NEGATIVE
Specific Gravity, Urine: 1.015 (ref 1.000–1.030)
Total Protein, Urine: NEGATIVE
Urine Glucose: NEGATIVE
Urobilinogen, UA: 0.2 (ref 0.0–1.0)
pH: 7.5 (ref 5.0–8.0)

## 2019-07-02 LAB — LIPID PANEL
Cholesterol: 185 mg/dL (ref 0–200)
HDL: 48.5 mg/dL (ref >39.00)
LDL Cholesterol: 118 mg/dL — ABNORMAL HIGH (ref 0–99)
NonHDL: 136.84
Total CHOL/HDL Ratio: 4
Triglycerides: 93 mg/dL (ref 0.0–149.0)
VLDL: 18.6 mg/dL (ref 0.0–40.0)

## 2019-07-02 LAB — CBC
HCT: 40.3 % (ref 36.0–46.0)
Hemoglobin: 13.3 g/dL (ref 12.0–15.0)
MCHC: 33.1 g/dL (ref 30.0–36.0)
MCV: 92.5 fl (ref 78.0–100.0)
Platelets: 213 10*3/uL (ref 150.0–400.0)
RBC: 4.35 Mil/uL (ref 3.87–5.11)
RDW: 13.2 % (ref 11.5–15.5)
WBC: 6.6 10*3/uL (ref 4.0–10.5)

## 2019-07-09 ENCOUNTER — Other Ambulatory Visit: Payer: Self-pay

## 2019-07-10 ENCOUNTER — Ambulatory Visit (INDEPENDENT_AMBULATORY_CARE_PROVIDER_SITE_OTHER): Payer: Medicare Other | Admitting: Behavioral Health

## 2019-07-10 DIAGNOSIS — M810 Age-related osteoporosis without current pathological fracture: Secondary | ICD-10-CM

## 2019-07-10 MED ORDER — DENOSUMAB 60 MG/ML ~~LOC~~ SOSY
60.0000 mg | PREFILLED_SYRINGE | Freq: Once | SUBCUTANEOUS | Status: AC
  Administered 2019-07-10: 60 mg via SUBCUTANEOUS

## 2019-07-10 NOTE — Progress Notes (Addendum)
Patient presents in clinic today for Prolia injection. SQ injection was given in the left arm. Patient tolerated it well. No signs or symptoms of a reaction were noted prior to patient leaving the nurse visit.  Agreed.

## 2019-07-23 LAB — HM MAMMOGRAPHY

## 2019-07-29 NOTE — Progress Notes (Signed)
Subjective:   Charlotte Brooks is a 74 y.o. female who presents for Medicare Annual (Subsequent) preventive examination.  I connected with Ioma today by telephone and verified that I am speaking with the correct person using two identifiers. Location patient: home Location provider: work Persons participating in the virtual visit: patient, Marine scientist.    I discussed the limitations, risks, security and privacy concerns of performing an evaluation and management service by telephone and the availability of in person appointments. I also discussed with the patient that there may be a patient responsible charge related to this service. The patient expressed understanding and verbally consented to this telephonic visit.    Interactive audio and video telecommunications were attempted between this provider and patient, however failed, due to patient having technical difficulties OR patient did not have access to video capability.  We continued and completed visit with audio only.  Some vital signs may be absent or patient reported.   Time Spent with patient on telephone encounter: 25 minutes  Review of Systems:   Cardiac Risk Factors include: advanced age (>21men, >50 women)     Objective:     Vitals: Ht 5\' 6"  (1.676 m)   Wt 150 lb (68 kg)   BMI 24.21 kg/m   Body mass index is 24.21 kg/m.  Advanced Directives 04/04/2018 11/16/2016 11/23/2015 08/02/2015  Does Patient Have a Medical Advance Directive? Yes No Yes No  Type of Advance Directive Living will;Healthcare Power of [place];Living will -  Copy of [place] in Chart? - - No - copy requested -  Would patient like information on creating a medical advance directive? - - - No - patient declined information    Tobacco Social History   Tobacco Use  Smoking Status Never Smoker  Smokeless Tobacco Never Used     Counseling given: Not Answered   Clinical Intake:  Pre-visit  preparation completed: Yes  Pain : No/denies pain     Nutritional Status: BMI of 19-24  Normal Nutritional Risks: None Diabetes: No  How often do you need to have someone help you when you read instructions, pamphlets, or other written materials from your doctor or pharmacy?: 1 - Never What is the last grade level you completed in school?: some college  Interpreter Needed?: No  Information entered by :: Caroleen Hamman LPN  Past Medical History:  Diagnosis Date  . Arthritis   . Constipation   . Osteoporosis    Gets Reclast   Past Surgical History:  Procedure Laterality Date  . COLONOSCOPY    . HERNIA REPAIR  2/08  . KNEE ARTHROPLASTY     lateral release   . KNEE SURGERY    . POLYPECTOMY     Family History  Problem Relation Age of Onset  . Stroke Mother   . Hypertension Mother   . Diabetes Brother   . Heart disease Brother   . Coronary artery disease Other   . Diabetes Maternal Grandmother   . Diabetes Maternal Grandfather   . Colon cancer Neg Hx   . Esophageal cancer Neg Hx   . Pancreatic cancer Neg Hx   . Rectal cancer Neg Hx   . Stomach cancer Neg Hx   . Colon polyps Neg Hx    Social History   Socioeconomic History  . Marital status: Married    Spouse name: [name]  . Number of children: 4  . Years of education: Not on file  . Highest education  level: Not on file  Occupational History  . Occupation: retired  Tobacco Use  . Smoking status: Never Smoker  . Smokeless tobacco: Never Used  Vaping Use  . Vaping Use: Never used  Substance and Sexual Activity  . Alcohol use: No    Comment: OCC WINE1-2 yearly  . Drug use: Never  . Sexual activity: Yes    Partners: Male  Other Topics Concern  . Not on file  Social History Narrative   Exercise-- water aerobics, yoga, machines   Social Determinants of Health   Financial Resource Strain: Low Risk   . Difficulty of Paying Living Expenses: Not hard at all  Food Insecurity: No Food Insecurity  . Worried  About Charity fundraiser in the Last Year: Never true  . Ran Out of Food in the Last Year: Never true  Transportation Needs: No Transportation Needs  . Lack of Transportation (Medical): No  . Lack of Transportation (Non-Medical): No  Physical Activity: Sufficiently Active  . Days of Exercise per Week: 7 days  . Minutes of Exercise per Session: 60 min  Stress: No Stress Concern Present  . Feeling of Stress : Not at all  Social Connections: Socially Integrated  . Frequency of Communication with Friends and Family: More than three times a week  . Frequency of Social Gatherings with Friends and Family: Once a week  . Attends Religious Services: More than 4 times per year  . Active Member of Clubs or Organizations: Yes  . Attends Archivist Meetings: More than 4 times per year  . Marital Status: Married    Outpatient Encounter Medications as of 07/30/2019  Medication Sig  . Biotin 5000 MCG CAPS Take 2 capsules by mouth daily.   . Cholecalciferol (VITAMIN D PO) Take 1,000 Units by mouth daily.  Marland Kitchen denosumab (PROLIA) 60 MG/ML SOLN injection Inject 60 mg into the skin every 6 (six) months. Administer in upper arm, thigh, or abdomen  . diclofenac sodium (VOLTAREN) 1 % GEL Apply 3 gm to 3 large joints up to 3 times a day.  Marland Kitchen glucosamine-chondroitin 500-400 MG tablet Take 1 tablet by mouth daily.  . Magnesium 400 MG TABS Take by mouth.  . Misc Natural Products (TART CHERRY ADVANCED PO) Take by mouth.  . Omega-3 Fatty Acids (FISH OIL) 1000 MG CAPS Take by mouth.  Marland Kitchen OVER THE COUNTER MEDICATION Swiss Kriss herbal laxative 2-4 daily  . polyethylene glycol powder (GLYCOLAX/MIRALAX) powder USING MEASURING CAP MIX  17GM IN WATER AND DRINK  EVERY MORNING  . vitamin C (ASCORBIC ACID) 500 MG tablet Take 1,000 mg by mouth daily.   . Wheat Dextrin (BENEFIBER PO) Take by mouth. Two tablespoons twice a day  . [DISCONTINUED] desonide (DESOWEN) 0.05 % cream Apply 1 application topically 2 (two) times  daily. (Patient not taking: Reported on 07/30/2019)   No facility-administered encounter medications on file as of 07/30/2019.    Activities of Daily Living In your present state of health, do you have any difficulty performing the following activities: 07/30/2019 06/26/2019  Hearing? N N  Vision? N N  Difficulty concentrating or making decisions? N N  Walking or climbing stairs? N N  Dressing or bathing? N N  Doing errands, shopping? N N  Preparing Food and eating ? N -  Using the Toilet? N -  In the past six months, have you accidently leaked urine? N -  Do you have problems with loss of bowel control? N -  Managing your  Medications? N -  Managing your Finances? N -  Housekeeping or managing your Housekeeping? N -  Some recent data might be hidden    Patient Care Team: Libby Maw, MD as PCP - General (Family Medicine) Mauri Pole, MD as Consulting Physician (Gastroenterology) Murlean Iba, MD as Consulting Physician (Orthopedic Surgery) Nada Libman, MD as Consulting Physician (Ophthalmology) Benay Pillow, DDS as Consulting Physician (Dentistry) Gaynelle Arabian, MD as Consulting Physician (Orthopedic Surgery)    Assessment:   This is a routine wellness examination for [place].  Exercise Activities and Dietary recommendations Current Exercise Habits: Home exercise routine, Type of exercise: walking (Zumba), Time (Minutes): 60, Frequency (Times/Week): 7, Weekly Exercise (Minutes/Week): 420, Intensity: Moderate  Goals Addressed            This Visit's Progress   . Patient Stated       Maintain current health status       Fall Risk: Fall Risk  07/30/2019 06/26/2019 05/08/2016 11/23/2015 11/23/2015  Falls in the past year? 0 0 Yes Yes Yes  Number falls in past yr: 0 - 1 1 1   Injury with Fall? 0 - Yes Yes Yes  Comment - - - - -  Risk for fall due to : - - - History of fall(s) -  Follow up Falls prevention discussed Falls evaluation  completed - - -    FALL RISK PREVENTION PERTAINING TO THE HOME:  Any stairs in or around the home? No  Home free of loose throw rugs in walkways, pet beds, electrical cords, etc? Yes  Adequate lighting in your home to reduce risk of falls? Yes   ASSISTIVE DEVICES UTILIZED TO PREVENT FALLS:  Life alert? No  Use of a cane, walker or w/c? No  Grab bars in the bathroom? Yes  Shower chair or bench in shower? No  Elevated toilet seat or a handicapped toilet? No   DME ORDERS:  DME order needed?  No   TIMED UP AND GO:  Was the test performed? No . Virtual visit    Depression Screen PHQ 2/9 Scores 07/30/2019 05/08/2016 11/23/2015 11/23/2015  PHQ - 2 Score 0 0 0 0     Cognitive Function: No cognitive impairment noted.  MMSE - Mini Mental State Exam 05/08/2016 11/23/2015  Orientation to time 4 5  Orientation to Place 5 5  Registration 3 3  Attention/ Calculation 5 5  Recall 3 3  Language- name 2 objects 2 2  Language- repeat 1 1  Language- follow 3 step command 3 3  Language- read & follow direction 1 1  Write a sentence 1 1  Copy design 1 1  Total score 29 30     6CIT Screen 11/23/2015  What Year? 0 points  What month? 0 points  What time? 0 points  Count back from 20 0 points  Months in reverse 0 points  Repeat phrase 0 points  Total Score 0    Immunization History  Administered Date(s) Administered  . Moderna SARS-COVID-2 Vaccination 03/03/2019, 04/01/2019  . Zoster 05/01/2007  . Zoster Recombinat (Shingrix) 10/16/2016, 04/07/2017    Qualifies for Shingles Vaccine? Up to Date  Tdap: Although this vaccine is not a covered service during a Wellness Exam, does the patient still wish to receive this vaccine today?  No . Virtual visit Education has been provided regarding the importance of this vaccine. Advised may receive this vaccine at local pharmacy or Health Dept. Aware to provide a copy of the vaccination  record if obtained from local pharmacy or Health Dept.  Verbalized acceptance and understanding.  Flu Vaccine: Due 09/2019  Pneumococcal Vaccine: Due for Pneumococcal vaccine. Does the patient want to receive this vaccine today?  No .Virtual visit Patient to discuss with PCP at next office visit.  Covid-19 Vaccine:  Completed vaccines  Screening Tests Health Maintenance  Topic Date Due  . TETANUS/TDAP  Never done  . PNA vac Low Risk Adult (1 of 2 - PCV13) Never done  . DEXA SCAN  11/29/2017  . COLONOSCOPY  06/04/2019  . INFLUENZA VACCINE  08/17/2019  . MAMMOGRAM  07/01/2020  . COVID-19 Vaccine  Completed  . Hepatitis C Screening  Completed    Cancer Screenings:  Colorectal Screening: Completed  06/04/2018. Repeat in 1 year years; Patient sees GI & plans to contact them to schedule her colonoscopy.  Mammogram: Completed 07/22/2019. Repeat every year.  Bone Density: Completed 05/2019. Results pending  Lung Cancer Screening: (Low Dose CT Chest recommended if Age 64-80 years, 30 pack-year currently smoking OR have quit w/in 15years.) does not qualify.    Additional Screening Hepatitis C Screening: Completed 06/30/2016  Vision Screening: Recommended annual ophthalmology exams for early detection of glaucoma and other disorders of the eye. Is the patient up to date with their annual eye exam?  Yes  Who is the provider or what is the name of the office in which the pt attends annual eye exams? Dr. Sharen Counter   Dental Screening: Recommended annual dental exams for proper oral hygiene  Community Resource Referral:  CRR required this visit?  No       Plan:  I have personally reviewed and addressed the Medicare Annual Wellness questionnaire and have noted the following in the patient's chart:  A. Medical and social history B. Use of alcohol, tobacco or illicit drugs  C. Current medications and supplements D. Functional ability and status E.  Nutritional status F.  Physical activity G. Advance directives H. List of other physicians I.    Hospitalizations, surgeries, and ER visits in previous 12 months J.  [place] such as hearing and vision if needed, cognitive and depression L. Referrals and appointments   In addition, I have reviewed and discussed with patient certain preventive protocols, quality metrics, and best practice recommendations. A written personalized care plan for preventive services as well as general preventive health recommendations were provided to patient.  Due to this being a telephonic visit, the after visit summary with patients personalized plan was offered to patient via mail or my-chart.Patient would like to access on my-chart.  Signed,    Marta Antu, LPN  07/20/8887 Nurse Health Advisor   Nurse Notes:None

## 2019-07-30 ENCOUNTER — Ambulatory Visit (INDEPENDENT_AMBULATORY_CARE_PROVIDER_SITE_OTHER): Payer: Medicare Other

## 2019-07-30 ENCOUNTER — Ambulatory Visit: Payer: Medicare Other

## 2019-07-30 VITALS — Ht 66.0 in | Wt 150.0 lb

## 2019-07-30 DIAGNOSIS — Z0000 Encounter for general adult medical examination without abnormal findings: Secondary | ICD-10-CM | POA: Diagnosis not present

## 2019-07-30 NOTE — Patient Instructions (Signed)
Charlotte Brooks , Thank you for taking time to complete your Medicare Wellness Visit. I appreciate your ongoing commitment to your health goals. Please review the following plan we discussed and let me know if I can assist you in the future.   Screening recommendations/referrals: Colonoscopy: Completed 06/04/2018. Due-Contact GI to schedule. Mammogram: Completed 07/2019-Due 07/2020 Bone Density: Completed 05/2019-Due 05/2021 Recommended yearly ophthalmology/optometry visit for glaucoma screening and checkup Recommended yearly dental visit for hygiene and checkup  Vaccinations: Influenza vaccine: Due 09/2019 Pneumococcal vaccine: Discuss with PCP at next office visit Tdap vaccine: Discuss with pharmacy Shingles vaccine: Completed vaccines   Covid-19:Completed vaccines  Advanced directives: Please bring a copy to your next appointment if available.  Conditions/risks identified: See problem list  Next appointment: Follow up in one year for your annual wellness visit    Preventive Care 65 Years and Older, Female Preventive care refers to lifestyle choices and visits with your health care provider that can promote health and wellness. What does preventive care include?  A yearly physical exam. This is also called an annual well check.  Dental exams once or twice a year.  Routine eye exams. Ask your health care provider how often you should have your eyes checked.  Personal lifestyle choices, including:  Daily care of your teeth and gums.  Regular physical activity.  Eating a healthy diet.  Avoiding tobacco and drug use.  Limiting alcohol use.  Practicing safe sex.  Taking low-dose aspirin every day.  Taking vitamin and mineral supplements as recommended by your health care provider. What happens during an annual well check? The services and screenings done by your health care provider during your annual well check will depend on your age, overall health, lifestyle risk factors,  and family history of disease. Counseling  Your health care provider may ask you questions about your:  Alcohol use.  Tobacco use.  Drug use.  Emotional well-being.  Home and relationship well-being.  Sexual activity.  Eating habits.  History of falls.  Memory and ability to understand (cognition).  Work and work Statistician.  Reproductive health. Screening  You may have the following tests or measurements:  Height, weight, and BMI.  Blood pressure.  Lipid and cholesterol levels. These may be checked every 5 years, or more frequently if you are over 27 years old.  Skin check.  Lung cancer screening. You may have this screening every year starting at age 80 if you have a 30-pack-year history of smoking and currently smoke or have quit within the past 15 years.  Fecal occult blood test (FOBT) of the stool. You may have this test every year starting at age 73.  Flexible sigmoidoscopy or colonoscopy. You may have a sigmoidoscopy every 5 years or a colonoscopy every 10 years starting at age 64.  Hepatitis C blood test.  Hepatitis B blood test.  Sexually transmitted disease (STD) testing.  Diabetes screening. This is done by checking your blood sugar (glucose) after you have not eaten for a while (fasting). You may have this done every 1-3 years.  Bone density scan. This is done to screen for osteoporosis. You may have this done starting at age 62.  Mammogram. This may be done every 1-2 years. Talk to your health care provider about how often you should have regular mammograms. Talk with your health care provider about your test results, treatment options, and if necessary, the need for more tests. Vaccines  Your health care provider may recommend certain vaccines, such as:  Influenza  vaccine. This is recommended every year.  Tetanus, diphtheria, and acellular pertussis (Tdap, Td) vaccine. You may need a Td booster every 10 years.  Zoster vaccine. You may need  this after age 68.  Pneumococcal 13-valent conjugate (PCV13) vaccine. One dose is recommended after age 60.  Pneumococcal polysaccharide (PPSV23) vaccine. One dose is recommended after age 19. Talk to your health care provider about which screenings and vaccines you need and how often you need them. This information is not intended to replace advice given to you by your health care provider. Make sure you discuss any questions you have with your health care provider. Document Released: 01/29/2015 Document Revised: 09/22/2015 Document Reviewed: 11/03/2014 Elsevier Interactive Patient Education  2017 [place] Prevention in the Home Falls can cause injuries. They can happen to people of all ages. There are many things you can do to make your home safe and to help prevent falls. What can I do on the outside of my home?  Regularly fix the edges of walkways and driveways and fix any cracks.  Remove anything that might make you trip as you walk through a door, such as a raised step or threshold.  Trim any bushes or trees on the path to your home.  Use bright outdoor lighting.  Clear any walking paths of anything that might make someone trip, such as rocks or tools.  Regularly check to see if handrails are loose or broken. Make sure that both sides of any steps have handrails.  Any raised decks and porches should have guardrails on the edges.  Have any leaves, snow, or ice cleared regularly.  Use sand or salt on walking paths during winter.  Clean up any spills in your garage right away. This includes oil or grease spills. What can I do in the bathroom?  Use night lights.  Install grab bars by the toilet and in the tub and shower. Do not use towel bars as grab bars.  Use non-skid mats or decals in the tub or shower.  If you need to sit down in the shower, use a plastic, non-slip stool.  Keep the floor dry. Clean up any water that spills on the floor as soon as it  happens.  Remove soap buildup in the tub or shower regularly.  Attach bath mats securely with double-sided non-slip rug tape.  Do not have throw rugs and other things on the floor that can make you trip. What can I do in the bedroom?  Use night lights.  Make sure that you have a light by your bed that is easy to reach.  Do not use any sheets or blankets that are too big for your bed. They should not hang down onto the floor.  Have a firm chair that has side arms. You can use this for support while you get dressed.  Do not have throw rugs and other things on the floor that can make you trip. What can I do in the kitchen?  Clean up any spills right away.  Avoid walking on wet floors.  Keep items that you use a lot in easy-to-reach places.  If you need to reach something above you, use a strong step stool that has a grab bar.  Keep electrical cords out of the way.  Do not use floor polish or wax that makes floors slippery. If you must use wax, use non-skid floor wax.  Do not have throw rugs and other things on the floor that can  make you trip. What can I do with my stairs?  Do not leave any items on the stairs.  Make sure that there are handrails on both sides of the stairs and use them. Fix handrails that are broken or loose. Make sure that handrails are as long as the stairways.  Check any carpeting to make sure that it is firmly attached to the stairs. Fix any carpet that is loose or worn.  Avoid having throw rugs at the top or bottom of the stairs. If you do have throw rugs, attach them to the floor with carpet tape.  Make sure that you have a light switch at the top of the stairs and the bottom of the stairs. If you do not have them, ask someone to add them for you. What else can I do to help prevent falls?  Wear shoes that:  Do not have high heels.  Have rubber bottoms.  Are comfortable and fit you well.  Are closed at the toe. Do not wear sandals.  If you  use a stepladder:  Make sure that it is fully opened. Do not climb a closed stepladder.  Make sure that both sides of the stepladder are locked into place.  Ask someone to hold it for you, if possible.  Clearly mark and make sure that you can see:  Any grab bars or handrails.  First and last steps.  Where the edge of each step is.  Use tools that help you move around (mobility aids) if they are needed. These include:  Canes.  Walkers.  Scooters.  Crutches.  Turn on the lights when you go into a dark area. Replace any light bulbs as soon as they burn out.  Set up your furniture so you have a clear path. Avoid moving your furniture around.  If any of your floors are uneven, fix them.  If there are any pets around you, be aware of where they are.  Review your medicines with your doctor. Some medicines can make you feel dizzy. This can increase your chance of falling. Ask your doctor what other things that you can do to help prevent falls. This information is not intended to replace advice given to you by your health care provider. Make sure you discuss any questions you have with your health care provider. Document Released: 10/29/2008 Document Revised: 06/10/2015 Document Reviewed: 02/06/2014 Elsevier Interactive Patient Education  2017 [employer].

## 2019-08-21 ENCOUNTER — Encounter: Payer: Self-pay | Admitting: Family Medicine

## 2019-08-21 NOTE — Telephone Encounter (Signed)
Lets set up a virtual.

## 2019-09-04 ENCOUNTER — Encounter: Payer: Self-pay | Admitting: Gastroenterology

## 2019-09-10 NOTE — Progress Notes (Deleted)
Office Visit Note  Patient: Charlotte Brooks             Date of Birth: December 11, 1945           MRN: [id number]             PCP: Libby Maw, MD Referring: Carollee Herter, Alferd Apa, * Visit Date: 09/23/2019 Occupation: @GUAROCC @  Subjective:  No chief complaint on file.   History of Present Illness: Charlotte Brooks is a 74 y.o. female ***   Activities of Daily Living:  Patient reports morning stiffness for *** {minute/hour:19697}.   Patient {ACTIONS;DENIES/REPORTS:21021675::"Denies"} nocturnal pain.  Difficulty dressing/grooming: {ACTIONS;DENIES/REPORTS:21021675::"Denies"} Difficulty climbing stairs: {ACTIONS;DENIES/REPORTS:21021675::"Denies"} Difficulty getting out of chair: {ACTIONS;DENIES/REPORTS:21021675::"Denies"} Difficulty using hands for taps, buttons, cutlery, and/or writing: {ACTIONS;DENIES/REPORTS:21021675::"Denies"}  No Rheumatology ROS completed.   PMFS History:  Patient Active Problem List   Diagnosis Date Noted  . DDD (degenerative disc disease), lumbar 06/28/2017  . Chronic SI joint pain 06/28/2017  . Primary osteoarthritis of both hands 06/28/2017  . Primary osteoarthritis of both knees 06/28/2017  . 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 DEPRESSION 05/31/2006    Past Medical History:  Diagnosis Date  . Arthritis   . Constipation   . Osteoporosis    Gets Reclast    Family History  Problem Relation Age of Onset  . Stroke Mother   . Hypertension Mother   . Diabetes Brother   . Heart disease Brother   . Coronary artery disease Other   . Diabetes Maternal Grandmother   . Diabetes Maternal Grandfather   . Colon cancer Neg Hx   . Esophageal cancer Neg Hx   . Pancreatic cancer Neg Hx   . Rectal cancer Neg Hx   . Stomach cancer Neg Hx   . Colon polyps Neg Hx    Past Surgical History:  Procedure Laterality Date  . COLONOSCOPY    . HERNIA REPAIR  2/08  . KNEE ARTHROPLASTY      lateral release   . KNEE SURGERY    . POLYPECTOMY     Social History   Social History Narrative   Exercise-- water aerobics, yoga, machines   Immunization History  Administered Date(s) Administered  . Moderna SARS-COVID-2 Vaccination 03/03/2019, 04/01/2019  . Zoster 05/01/2007  . Zoster Recombinat (Shingrix) 10/16/2016, 04/07/2017     Objective: Vital Signs: There were no vitals taken for this visit.   Physical Exam   Musculoskeletal Exam: ***  CDAI Exam: CDAI Score: -- Patient Global: --; Provider Global: -- Swollen: --; Tender: -- Joint Exam 09/23/2019   No joint exam has been documented for this visit   There is currently no information documented on the homunculus. Go to the Rheumatology activity and complete the homunculus joint exam.  Investigation: No additional findings.  Imaging: No results found.  Recent Labs: Lab Results  Component Value Date   WBC 6.6 07/02/2019   HGB 13.3 07/02/2019   PLT 213.0 07/02/2019   NA 140 07/02/2019   K 4.9 07/02/2019   CL 105 07/02/2019   CO2 28 07/02/2019   GLUCOSE 86 07/02/2019   BUN 18 07/02/2019   CREATININE 0.96 07/02/2019   BILITOT 0.6 07/02/2019   ALKPHOS 73 07/02/2019   AST 24 07/02/2019   ALT 15 07/02/2019   PROT 7.1 07/02/2019   ALBUMIN 4.2 07/02/2019   CALCIUM 9.7 07/02/2019   GFRAA >60 08/02/2015    Speciality Comments: No specialty comments available.  Procedures:  No procedures performed Allergies: Penicillins, Codeine, and Sulfonamide derivatives   Assessment / Plan:     Visit Diagnoses: Age-related osteoporosis without current pathological fracture - on Prolia 60 mg sq injections every 6 months ordered by PCP.  She is taking vitamin D 1,000 units daily.  He was treated with Fosamax and Reclast in the past.  DDD (degenerative disc disease), lumbar  Chronic SI joint pain  Primary osteoarthritis of both hands  Primary osteoarthritis of both knees  Other insomnia  Orders: No orders  of the defined types were placed in this encounter.  No orders of the defined types were placed in this encounter.   Face-to-face time spent with patient was *** minutes. Greater than 50% of time was spent in counseling and coordination of care.  Follow-Up Instructions: No follow-ups on file.   Ofilia Neas, PA-C  Note - This record has been created using Dragon software.  Chart creation errors have been sought, but may not always  have been located. Such creation errors do not reflect on  the standard of medical care.

## 2019-09-23 ENCOUNTER — Ambulatory Visit: Payer: Medicare Other | Admitting: Rheumatology

## 2019-10-22 ENCOUNTER — Other Ambulatory Visit: Payer: Self-pay

## 2019-10-22 ENCOUNTER — Encounter: Payer: Self-pay | Admitting: Gastroenterology

## 2019-10-22 ENCOUNTER — Ambulatory Visit (AMBULATORY_SURGERY_CENTER): Payer: Self-pay | Admitting: *Deleted

## 2019-10-22 VITALS — Ht 66.0 in | Wt 148.0 lb

## 2019-10-22 DIAGNOSIS — Z86010 Personal history of colonic polyps: Secondary | ICD-10-CM

## 2019-10-22 MED ORDER — NA SULFATE-K SULFATE-MG SULF 17.5-3.13-1.6 GM/177ML PO SOLN: 1.0000 | Freq: Once | ORAL | 0 refills | Status: AC

## 2019-10-22 NOTE — Progress Notes (Signed)

## 2019-11-05 ENCOUNTER — Encounter: Payer: Self-pay | Admitting: Gastroenterology

## 2019-11-05 ENCOUNTER — Ambulatory Visit (AMBULATORY_SURGERY_CENTER): Payer: Medicare Other | Admitting: Gastroenterology

## 2019-11-05 ENCOUNTER — Other Ambulatory Visit: Payer: Self-pay

## 2019-11-05 VITALS — BP 134/80 | HR 62 | Temp 97.3°F | Resp 15 | Ht 66.0 in | Wt 148.0 lb

## 2019-11-05 DIAGNOSIS — D120 Benign neoplasm of cecum: Secondary | ICD-10-CM | POA: Diagnosis not present

## 2019-11-05 DIAGNOSIS — Z86010 Personal history of colonic polyps: Secondary | ICD-10-CM | POA: Diagnosis not present

## 2019-11-05 DIAGNOSIS — D125 Benign neoplasm of sigmoid colon: Secondary | ICD-10-CM

## 2019-11-05 DIAGNOSIS — D123 Benign neoplasm of transverse colon: Secondary | ICD-10-CM

## 2019-11-05 MED ORDER — SODIUM CHLORIDE 0.9 % IV SOLN: 500.0000 mL | INTRAVENOUS | Status: DC

## 2019-11-05 NOTE — Patient Instructions (Signed)
Handouts given for hemorrhoids and polyps.  YOU HAD AN ENDOSCOPIC PROCEDURE TODAY AT THE [facility]:   Refer to the procedure report that was given to you for any specific questions about what was found during the examination.  If the procedure report does not answer your questions, please call your gastroenterologist to clarify.  If you requested that your care partner not be given the details of your procedure findings, then the procedure report has been included in a sealed envelope for you to review at your convenience later.  YOU SHOULD EXPECT: Some feelings of bloating in the abdomen. Passage of more gas than usual.  Walking can help get rid of the air that was put into your GI tract during the procedure and reduce the bloating. If you had a lower endoscopy (such as a colonoscopy or flexible sigmoidoscopy) you may notice spotting of blood in your stool or on the toilet paper. If you underwent a bowel prep for your procedure, you may not have a normal bowel movement for a few days.  Please Note:  You might notice some irritation and congestion in your nose or some drainage.  This is from the oxygen used during your procedure.  There is no need for concern and it should clear up in a day or so.  SYMPTOMS TO REPORT IMMEDIATELY:   Following lower endoscopy (colonoscopy or flexible sigmoidoscopy):  Excessive amounts of blood in the stool  Significant tenderness or worsening of abdominal pains  Swelling of the abdomen that is new, acute  Fever of 100F or higher  For urgent or emergent issues, a gastroenterologist can be reached at any hour by calling [phone]. Do not use MyChart messaging for urgent concerns.    DIET:  We do recommend a small meal at first, but then you may proceed to your regular diet.  Drink plenty of fluids but you should avoid alcoholic beverages for 24 hours.  ACTIVITY:  You should plan to take it easy for the rest of today and you should NOT DRIVE or  use heavy machinery until tomorrow (because of the sedation medicines used during the test).    FOLLOW UP: Our staff will call the number listed on your records 48-72 hours following your procedure to check on you and address any questions or concerns that you may have regarding the information given to you following your procedure. If we do not reach you, we will leave a message.  We will attempt to reach you two times.  During this call, we will ask if you have developed any symptoms of COVID 19. If you develop any symptoms (ie: fever, flu-like symptoms, shortness of breath, cough etc.) before then, please call [phone].  If you test positive for Covid 19 in the 2 weeks post procedure, please call and report this information to us.    If any biopsies were taken you will be contacted by phone or by letter within the next 1-3 weeks.  Please call us at [phone] if you have not heard about the biopsies in 3 weeks.    SIGNATURES/CONFIDENTIALITY: You and/or your care partner have signed paperwork which will be entered into your electronic medical record.  These signatures attest to the fact that that the information above on your After Visit Summary has been reviewed and is understood.  Full responsibility of the confidentiality of this discharge information lies with you and/or your care-partner. 

## 2019-11-05 NOTE — Progress Notes (Signed)
Report given to PACU, vss 

## 2019-11-05 NOTE — Op Note (Signed)
[facility] Patient Name: Charlotte Brooks Procedure Date: 11/05/2019 11:31 AM MRN: [id number] Endoscopist: Mauri Pole , MD Age: 74 Referring MD:  Date of Birth: 1945-10-05 Gender: Female Account #: [id number] Procedure:                Colonoscopy Indications:              High risk colon cancer surveillance: Personal                            history of colonic polyps, Surveillance: History of                            numerous (> 10) adenomas on last colonoscopy (< 3                            yrs) Medicines:                Monitored Anesthesia Care Procedure:                Pre-Anesthesia Assessment:                           - Prior to the procedure, a History and Physical                            was performed, and patient medications and                            allergies were reviewed. The patient's tolerance of                            previous anesthesia was also reviewed. The risks                            and benefits of the procedure and the sedation                            options and risks were discussed with the patient.                            All questions were answered, and informed consent                            was obtained. Prior Anticoagulants: The patient has                            taken no previous anticoagulant or antiplatelet                            agents. ASA Grade Assessment: II - A patient with                            mild systemic disease. After reviewing the risks  and benefits, the patient was deemed in                            satisfactory condition to undergo the procedure.                           After obtaining informed consent, the colonoscope                            was passed under direct vision. Throughout the                            procedure, the patient's blood pressure, pulse, and                            oxygen saturations were monitored continuously.  The                            Colonoscope was introduced through the anus and                            advanced to the the cecum, identified by                            appendiceal orifice and ileocecal valve. The                            colonoscopy was performed without difficulty. The                            patient tolerated the procedure well. The quality                            of the bowel preparation was good. The ileocecal                            valve, appendiceal orifice, and rectum were                            photographed. Scope In: 11:48:28 AM Scope Out: 12:13:05 PM Scope Withdrawal Time: 0 hours 19 minutes 18 seconds  Total Procedure Duration: 0 hours 24 minutes 37 seconds  Findings:                 The perianal and digital rectal examinations were                            normal.                           Five sessile polyps were found in the sigmoid                            colon, transverse colon and cecum. The polyps were  3 to 7 mm in size. These polyps were removed with a                            cold snare. Resection and retrieval were complete.                           A less than 1 mm polyp was found in the transverse                            colon. The polyp was sessile. The polyp was removed                            with a cold biopsy forceps. Resection and retrieval                            were complete.                           Non-bleeding internal hemorrhoids were found during                            retroflexion. The hemorrhoids were small.                           A diffuse area of moderate melanosis was found in                            the entire colon. Complications:            No immediate complications. Estimated Blood Loss:     Estimated blood loss was minimal. Impression:               - Five 3 to 7 mm polyps in the sigmoid colon, in                            the transverse colon  and in the cecum, removed with                            a cold snare. Resected and retrieved.                           - One less than 1 mm polyp in the transverse colon,                            removed with a cold biopsy forceps. Resected and                            retrieved.                           - Non-bleeding internal hemorrhoids.                           - Melanosis in the colon. Recommendation:           -  Patient has a contact number available for                            emergencies. The signs and symptoms of potential                            delayed complications were discussed with the                            patient. Return to normal activities tomorrow.                            Written discharge instructions were provided to the                            patient.                           - Resume previous diet.                           - Continue present medications.                           - Await pathology results.                           - Repeat colonoscopy in 3 - 5 years for                            surveillance based on pathology results. Mauri Pole, MD 11/05/2019 12:21:38 PM This report has been signed electronically.

## 2019-11-05 NOTE — Progress Notes (Signed)
Called to room to assist during endoscopic procedure.  Patient ID and intended procedure confirmed with present staff. Received instructions for my participation in the procedure from the performing physician.  

## 2019-11-07 ENCOUNTER — Telehealth: Payer: Self-pay | Admitting: *Deleted

## 2019-11-07 NOTE — Telephone Encounter (Signed)
°  Follow up Call-  Call back number 11/05/2019 06/04/2018  Post procedure Call Back phone  # [phone]  Permission to leave phone message Yes Yes  Some recent data might be hidden     Patient questions:  Do you have a fever, pain , or abdominal swelling? No. Pain Score  0 *  Have you tolerated food without any problems? Yes.    Have you been able to return to your normal activities? Yes.    Do you have any questions about your discharge instructions: Diet   No. Medications  No. Follow up visit  No.  Do you have questions or concerns about your Care? No.  Actions: * If pain score is 4 or above: No action needed, pain less than 4  1. Have you developed a fever since your procedure? NO  2.   Have you had an respiratory symptoms (SOB or cough) since your procedure? NO  3.   Have you tested positive for COVID 19 since your procedure NO  4.   Have you had any family members/close contacts diagnosed with the COVID 19 since your procedure?  NO   If yes to any of these questions please route to Joylene John, RN and Joella Prince, RN

## 2019-11-25 ENCOUNTER — Encounter: Payer: Self-pay | Admitting: Family Medicine

## 2019-11-25 ENCOUNTER — Encounter: Payer: Self-pay | Admitting: Gastroenterology

## 2020-01-01 ENCOUNTER — Telehealth: Payer: Self-pay

## 2020-01-01 NOTE — Telephone Encounter (Signed)
Patient calling in regards to Prolia injection.  Pt said that it is time to schedule her 6 month injection.  I informed pt that we are in the process of getting a team together to continue the Prolia process.  I informed pt that I would call her back as soon as possible.

## 2020-01-06 ENCOUNTER — Telehealth: Payer: Self-pay

## 2020-01-06 NOTE — Telephone Encounter (Signed)
Patient calling about getting her Prolia injection for this month.  I informed patient that I would look into getting her injection before the month is out, as we just had access to the portal as of yesterday.   I called [employer] rep to get guidance on this for pt.  She informed me that being that pt's injection is toward the end of the year that we will wait until January to run her benefits and know her cost if any and to assure pt that it is ok to get it close out of 6 month protocol.

## 2020-01-07 ENCOUNTER — Telehealth: Payer: Self-pay

## 2020-01-07 NOTE — Telephone Encounter (Signed)
I called pt and informed her of the conversation between [place] and I.  Pt verbalized understanding.

## 2020-01-30 ENCOUNTER — Telehealth: Payer: Self-pay

## 2020-01-30 NOTE — Telephone Encounter (Signed)
I called pt and left a detailed message for her to call the office in regard to her Prolia injection.  Pt need to schedule a nurse visit and also I would like to go over cost/deductable with her.

## 2020-02-05 ENCOUNTER — Encounter: Payer: Self-pay | Admitting: Family Medicine

## 2020-02-05 NOTE — Telephone Encounter (Signed)
I spoke with pt and informed her that I did leave a detailed message informing her to call office, to discuss cost and schedule a nurse visit for injection.  Pt explained that she never received a message.  I discussed cost and scheduled pt to come in for a nurse visit, as the injection here, in office.

## 2020-02-10 ENCOUNTER — Ambulatory Visit (INDEPENDENT_AMBULATORY_CARE_PROVIDER_SITE_OTHER): Payer: Medicare Other

## 2020-02-10 ENCOUNTER — Other Ambulatory Visit: Payer: Self-pay

## 2020-02-10 DIAGNOSIS — M810 Age-related osteoporosis without current pathological fracture: Secondary | ICD-10-CM

## 2020-02-10 MED ORDER — DENOSUMAB 60 MG/ML ~~LOC~~ SOSY
60.0000 mg | PREFILLED_SYRINGE | Freq: Once | SUBCUTANEOUS | Status: AC
  Administered 2020-02-10: 60 mg via SUBCUTANEOUS

## 2020-02-10 NOTE — Progress Notes (Addendum)
Per orders of Dr. Ethelene Hal  injection of Prolia given by Verline Lema L Ginnette Gates in right arm. Patient tolerated injection well. No signs or symptoms of a reaction were noted prior to patient leaving the nurse visit. Patient will make appointment for 6 month.

## 2020-02-10 NOTE — Patient Instructions (Signed)
Health Maintenance Due  Topic Date Due  . TETANUS/TDAP  Never done  . PNA vac Low Risk Adult (1 of 2 - PCV13) Never done  . DEXA SCAN  11/29/2017  . INFLUENZA VACCINE  Never done  . COVID-19 Vaccine (3 - Booster for Moderna series) 10/02/2019    Depression screen PHQ 2/9 07/30/2019 05/08/2016 11/23/2015  Decreased Interest 0 0 0  Down, Depressed, Hopeless 0 0 0  PHQ - 2 Score 0 0 0

## 2020-05-03 DIAGNOSIS — D229 Melanocytic nevi, unspecified: Secondary | ICD-10-CM | POA: Insufficient documentation

## 2020-05-03 DIAGNOSIS — D1801 Hemangioma of skin and subcutaneous tissue: Secondary | ICD-10-CM | POA: Insufficient documentation

## 2020-05-03 DIAGNOSIS — L821 Other seborrheic keratosis: Secondary | ICD-10-CM | POA: Insufficient documentation

## 2020-08-10 ENCOUNTER — Ambulatory Visit (INDEPENDENT_AMBULATORY_CARE_PROVIDER_SITE_OTHER): Payer: Medicare Other

## 2020-08-10 ENCOUNTER — Other Ambulatory Visit: Payer: Self-pay

## 2020-08-10 ENCOUNTER — Telehealth: Payer: Self-pay | Admitting: Family Medicine

## 2020-08-10 DIAGNOSIS — M810 Age-related osteoporosis without current pathological fracture: Secondary | ICD-10-CM

## 2020-08-10 MED ORDER — DENOSUMAB 60 MG/ML ~~LOC~~ SOSY
60.0000 mg | PREFILLED_SYRINGE | Freq: Once | SUBCUTANEOUS | Status: AC
  Administered 2020-08-10: 60 mg via SUBCUTANEOUS

## 2020-08-10 NOTE — Progress Notes (Addendum)
Per orders of Dr. Ethelene Hal pt is here for prolia injection, pt received injection in right arm. Pt tolerated injection well.

## 2020-08-10 NOTE — Telephone Encounter (Signed)
Pt requesting female provider for her Physical. Requesting TOC from Dr. Ethelene Hal to [facility] Is this ok?

## 2020-08-20 LAB — HM MAMMOGRAPHY

## 2020-09-30 ENCOUNTER — Other Ambulatory Visit: Payer: Self-pay

## 2020-09-30 ENCOUNTER — Encounter: Payer: Self-pay | Admitting: Nurse Practitioner

## 2020-09-30 ENCOUNTER — Ambulatory Visit (INDEPENDENT_AMBULATORY_CARE_PROVIDER_SITE_OTHER): Payer: Medicare Other | Admitting: Nurse Practitioner

## 2020-09-30 VITALS — BP 124/60 | HR 74 | Temp 97.6°F | Ht 64.25 in | Wt 158.0 lb

## 2020-09-30 DIAGNOSIS — G8929 Other chronic pain: Secondary | ICD-10-CM

## 2020-09-30 DIAGNOSIS — K5909 Other constipation: Secondary | ICD-10-CM

## 2020-09-30 DIAGNOSIS — L209 Atopic dermatitis, unspecified: Secondary | ICD-10-CM

## 2020-09-30 DIAGNOSIS — M545 Low back pain, unspecified: Secondary | ICD-10-CM | POA: Diagnosis not present

## 2020-09-30 DIAGNOSIS — R635 Abnormal weight gain: Secondary | ICD-10-CM

## 2020-09-30 DIAGNOSIS — M810 Age-related osteoporosis without current pathological fracture: Secondary | ICD-10-CM | POA: Diagnosis not present

## 2020-09-30 DIAGNOSIS — E7800 Pure hypercholesterolemia, unspecified: Secondary | ICD-10-CM

## 2020-09-30 DIAGNOSIS — M5136 Other intervertebral disc degeneration, lumbar region: Secondary | ICD-10-CM

## 2020-09-30 LAB — COMPREHENSIVE METABOLIC PANEL
ALT: 16 U/L (ref 0–35)
AST: 24 U/L (ref 0–37)
Albumin: 4.2 g/dL (ref 3.5–5.2)
Alkaline Phosphatase: 93 U/L (ref 39–117)
BUN: 12 mg/dL (ref 6–23)
CO2: 27 mEq/L (ref 19–32)
Calcium: 9.4 mg/dL (ref 8.4–10.5)
Chloride: 104 mEq/L (ref 96–112)
Creatinine, Ser: 0.89 mg/dL (ref 0.40–1.20)
GFR: 63.49 mL/min (ref >60.00)
Glucose, Bld: 96 mg/dL (ref 70–99)
Potassium: 4.1 mEq/L (ref 3.5–5.1)
Sodium: 139 mEq/L (ref 135–145)
Total Bilirubin: 0.6 mg/dL (ref 0.2–1.2)
Total Protein: 7.7 g/dL (ref 6.0–8.3)

## 2020-09-30 LAB — LIPID PANEL
Cholesterol: 179 mg/dL (ref 0–200)
HDL: 52.9 mg/dL (ref >39.00)
LDL Cholesterol: 110 mg/dL — ABNORMAL HIGH (ref 0–99)
NonHDL: 125.75
Total CHOL/HDL Ratio: 3
Triglycerides: 80 mg/dL (ref 0.0–149.0)
VLDL: 16 mg/dL (ref 0.0–40.0)

## 2020-09-30 LAB — TSH: TSH: 1.86 u[IU]/mL (ref 0.35–5.50)

## 2020-09-30 LAB — HEMOGLOBIN A1C: Hgb A1c MFr Bld: 5.7 % (ref 4.6–6.5)

## 2020-09-30 MED ORDER — DESONIDE 0.05 % EX CREA: 1.0000 "application " | TOPICAL_CREAM | Freq: Two times a day (BID) | CUTANEOUS | 0 refills | Status: DC

## 2020-09-30 MED ORDER — MELOXICAM 7.5 MG PO TABS: ORAL_TABLET | ORAL | 1 refills | Status: DC

## 2020-09-30 NOTE — Patient Instructions (Addendum)
Start calcium supplement gummies (pediatric dose)  Go to lab for blood draw.  Preventive Care 7 Years and Older, Female Preventive care refers to lifestyle choices and visits with your health care provider that can promote health and wellness. This includes: A yearly physical exam. This is also called an annual wellness visit. Regular dental and eye exams. Immunizations. Screening for certain conditions. Healthy lifestyle choices, such as: Eating a healthy diet. Getting regular exercise. Not using drugs or products that contain nicotine and tobacco. Limiting alcohol use. What can I expect for my preventive care visit? Physical exam Your health care provider will check your: Height and weight. These may be used to calculate your BMI (body mass index). BMI is a measurement that tells if you are at a healthy weight. Heart rate and blood pressure. Body temperature. Skin for abnormal spots. Counseling Your health care provider may ask you questions about your: Past medical problems. Family's medical history. Alcohol, tobacco, and drug use. Emotional well-being. Home life and relationship well-being. Sexual activity. Diet, exercise, and sleep habits. History of falls. Memory and ability to understand (cognition). Work and work Statistician. Pregnancy and menstrual history. Access to firearms. What immunizations do I need? Vaccines are usually given at various ages, according to a schedule. Your health care provider will recommend vaccines for you based on your age, medical history, and lifestyle or other factors, such as travel or where you work. What tests do I need? Blood tests Lipid and cholesterol levels. These may be checked every 5 years, or more often depending on your overall health. Hepatitis C test. Hepatitis B test. Screening Lung cancer screening. You may have this screening every year starting at age 75 if you have a 30-pack-year history of smoking and currently  smoke or have quit within the past 15 years. Colorectal cancer screening. All adults should have this screening starting at age 75 and continuing until age 75. Your health care provider may recommend screening at age 75 if you are at increased risk. You will have tests every 1-10 years, depending on your results and the type of screening test. Diabetes screening. This is done by checking your blood sugar (glucose) after you have not eaten for a while (fasting). You may have this done every 1-3 years. Mammogram. This may be done every 1-2 years. Talk with your health care provider about how often you should have regular mammograms. Abdominal aortic aneurysm (AAA) screening. You may need this if you are a current or former smoker. BRCA-related cancer screening. This may be done if you have a family history of breast, ovarian, tubal, or peritoneal cancers. Other tests STD (sexually transmitted disease) testing, if you are at risk. Bone density scan. This is done to screen for osteoporosis. You may have this done starting at age 75. Talk with your health care provider about your test results, treatment options, and if necessary, the need for more tests. Follow these instructions at home: Eating and drinking  Eat a diet that includes fresh fruits and vegetables, whole grains, lean protein, and low-fat dairy products. Limit your intake of foods with high amounts of sugar, saturated fats, and salt. Take vitamin and mineral supplements as recommended by your health care provider. Do not drink alcohol if your health care provider tells you not to drink. If you drink alcohol: Limit how much you have to 0-1 drink a day. Be aware of how much alcohol is in your drink. In the [place] one drink equals one 12 oz bottle  of beer (355 mL), one 5 oz glass of wine (148 mL), or one 1 oz glass of hard liquor (44 mL). Lifestyle Take daily care of your teeth and gums. Brush your teeth every morning and night with  fluoride toothpaste. Floss one time each day. Stay active. Exercise for at least 30 minutes 5 or more days each week. Do not use any products that contain nicotine or tobacco, such as cigarettes, e-cigarettes, and chewing tobacco. If you need help quitting, ask your health care provider. Do not use drugs. If you are sexually active, practice safe sex. Use a condom or other form of protection in order to prevent STIs (sexually transmitted infections). Talk with your health care provider about taking a low-dose aspirin or statin. Find healthy ways to cope with stress, such as: Meditation, yoga, or listening to music. Journaling. Talking to a trusted person. Spending time with friends and family. Safety Always wear your seat belt while driving or riding in a vehicle. Do not drive: If you have been drinking alcohol. Do not ride with someone who has been drinking. When you are tired or distracted. While texting. Wear a helmet and other protective equipment during sports activities. If you have firearms in your house, make sure you follow all gun safety procedures. What's next? Visit your health care provider once a year for an annual wellness visit. Ask your health care provider how often you should have your eyes and teeth checked. Stay up to date on all vaccines. This information is not intended to replace advice given to you by your health care provider. Make sure you discuss any questions you have with your health care provider. Document Revised: 03/12/2020 Document Reviewed: 12/27/2017 Elsevier Patient Education  2022 [employer].

## 2020-09-30 NOTE — Progress Notes (Signed)
Subjective:    Patient ID: Charlotte Brooks, female    DOB: 1945-08-15, 75 y.o.   MRN: [id number]  Patient presents today for annual wellness check and eval of chronic conditions.  HPI Memory exercise:Cross word puzzles, reading.  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 Advised about risk of GI bleed and decline in renal functions. Advised to take with food and avoid other OTC NSAIDs. meloxicam refill sent  Vision:has upcoming appt Dental: up to date Diet:heart healthy Exercise: walking, stationery bicycle, weight training, yoga, Resistance training 3x/week and yoga Weight:  Wt Readings from Last 3 Encounters:  09/30/20 158 lb (71.7 kg)  11/05/19 148 lb (67.1 kg)  10/22/19 148 lb (67.1 kg)   Sexual History (orientation,birth control, marital status, STD): up to date with mammogram GYN: Charlotte Brooks. Deferred breast and pelvic exam to GYN  Depression/Suicide: Depression screen [facility] 2/9 09/30/2020 07/30/2019 05/08/2016 11/23/2015 11/23/2015 06/29/2014 06/29/2014  Decreased Interest 0 0 0 0 0 0 0  Down, Depressed, Hopeless 0 0 0 0 0 0 0  PHQ - 2 Score 0 0 0 0 0 0 0  Altered sleeping 3 - - - - - -  Tired, decreased energy 1 - - - - - -  Change in appetite 0 - - - - - -  Feeling bad or failure about yourself  0 - - - - - -  Trouble concentrating 0 - - - - - -  Moving slowly or fidgety/restless 0 - - - - - -  Suicidal thoughts 0 - - - - - -  PHQ-9 Score 4 - - - - - -  Difficult doing work/chores Not difficult at all - - - - - -   MMSE - Mini Mental State Exam 09/30/2020 05/08/2016  11/23/2015  Orientation to time '5 4 5  '$ Orientation to Place '5 5 5  '$ Registration '3 3 3  '$ Attention/ Calculation '5 5 5  '$ Recall '3 3 3  '$ Language- name 2 objects '2 2 2  '$ Language- repeat '1 1 1  '$ Language- follow 3 step command '3 3 3  '$ Language- read & follow direction '1 1 1  '$ Write a sentence '1 1 1  '$ Copy design '1 1 1  '$ Total score '30 29 30   '$ Immunizations: (TDAP, Hep C screen, Pneumovax, Influenza, zoster)  Health Maintenance  Topic Date Due   Tetanus Vaccine  Never done   COVID-19 Vaccine (3 - Moderna risk series) 04/29/2019   Flu Shot  Never done   DEXA scan (bone density measurement)  05/21/2021   Colon Cancer Screening  11/05/2022   Hepatitis C Screening: USPSTF Recommendation to screen - Ages 18-79 yo.  Completed   Zoster (Shingles) Vaccine  Completed   HPV Vaccine  Aged Out   Fall Risk: Fall Risk  09/30/2020 07/30/2019 06/26/2019 05/08/2016 11/23/2015 11/23/2015 11/23/2015  Falls in the past year? 0 0 0 Yes Yes Yes Yes  Number falls in past yr: 0 0 - '1 1 1 1  '$ Injury with Fall? 0 0 - Yes Yes Yes Yes  Comment - - - - - - head concussion and knee injury  Risk  for fall due to : No Fall Risks - - - History of fall(s) - -  Follow up Falls evaluation completed Falls prevention discussed Falls evaluation completed - - - -   Advanced Directive: Advanced Directives 04/04/2018  Does Patient Have a Medical Advance Directive? Yes  Type of Advance Directive Living will;Healthcare Power of [place] in Chart? -  Would patient like information on creating a medical advance directive? -    Medications and allergies reviewed with patient and updated if appropriate.  Patient Active Problem List   Diagnosis Date Noted   Chronic constipation 10/01/2020   Cherry angioma 05/03/2020   Seborrheic keratoses 05/03/2020   Atypical glandular cells of undetermined significance (AGUS) on cervical Pap smear 11/15/2017   DDD (degenerative disc disease), lumbar 06/28/2017    Chronic SI joint pain 06/28/2017   Primary osteoarthritis of both hands 06/28/2017   Primary osteoarthritis of both knees 06/28/2017   SENILE OSTEOPOROSIS 05/18/2009   Persistent disorder of initiating or maintaining sleep 05/31/2006   Current Outpatient Medications on File Prior to Visit  Medication Sig Dispense Refill   Biotin 5000 MCG CAPS Take 2 capsules by mouth daily.      Cholecalciferol (VITAMIN D PO) Take 1,000 Units by mouth daily.     denosumab (PROLIA) 60 MG/ML SOLN injection Inject 60 mg into the skin every 6 (six) months. Administer in upper arm, thigh, or abdomen 1 Syringe 0   Misc Natural Products (TART CHERRY ADVANCED PO) Take by mouth.     Omega-3 Fatty Acids (FISH OIL) 1000 MG CAPS Take by mouth.     OVER THE COUNTER MEDICATION Swiss Kriss herbal laxative 2-4 daily     polyethylene glycol powder (GLYCOLAX/MIRALAX) powder USING MEASURING CAP MIX  17GM IN WATER AND DRINK  EVERY MORNING 1581 g 3   vitamin C (ASCORBIC ACID) 500 MG tablet Take 1,000 mg by mouth daily.      Wheat Dextrin (BENEFIBER PO) Take by mouth. Two tablespoons twice a day     No current facility-administered medications on file prior to visit.    Past Medical History:  Diagnosis Date   Arthritis    Constipation    Osteoporosis    Gets Reclast    Past Surgical History:  Procedure Laterality Date   COLONOSCOPY     HERNIA REPAIR  2/08   KNEE ARTHROPLASTY     lateral release    KNEE SURGERY     POLYPECTOMY      Social History   Socioeconomic History   Marital status: Married    Spouse name: Charlotte Brooks   Number of children: 4   Years of education: Not on file   Highest education level: Not on file  Occupational History   Occupation: retired  Tobacco Use   Smoking status: Never   Smokeless tobacco: Never  Vaping Use   Vaping Use: Never used  Substance and Sexual Activity   Alcohol use: No    Comment: [place] yearly   Drug use: Never   Sexual activity: Yes    Partners: Male   Other Topics Concern   Not on file  Social History Narrative   Exercise-- water aerobics, yoga, machines   Social Determinants of Health   Financial Resource Strain: Not on file  Food Insecurity: Not on file  Transportation Needs: Not on file  Physical Activity: Not on file  Stress: Not on file  Social Connections: Not on file  Family History  Problem Relation Age of Onset   Stroke Mother    Hypertension Mother    Diabetes Brother    Heart disease Brother    Coronary artery disease Other    Diabetes Maternal Grandmother    Diabetes Maternal Grandfather    Colon cancer Neg Hx    Esophageal cancer Neg Hx    Pancreatic cancer Neg Hx    Rectal cancer Neg Hx    Stomach cancer Neg Hx    Colon polyps Neg Hx        Review of Systems  Constitutional:  Negative for fever, malaise/fatigue and weight loss.  HENT:  Negative for congestion and sore throat.   Eyes:        Negative for visual changes  Respiratory:  Negative for cough and shortness of breath.   Cardiovascular:  Negative for chest pain, palpitations and leg swelling.  Gastrointestinal:  Negative for blood in stool, constipation, diarrhea and heartburn.  Genitourinary:  Negative for dysuria, frequency and urgency.  Musculoskeletal:  Positive for back pain. Negative for falls, joint pain and myalgias.  Skin:  Negative for rash.  Neurological:  Negative for dizziness, sensory change and headaches.  Endo/Heme/Allergies:  Does not bruise/bleed easily.  Psychiatric/Behavioral:  Positive for memory loss. Negative for depression, hallucinations, substance abuse and suicidal ideas. The patient is not nervous/anxious and does not have insomnia.    Objective:   Vitals:   09/30/20 0808  BP: 124/60  Pulse: 74  Temp: 97.6 F (36.4 C)  SpO2: 100%   Body mass index is 26.91 kg/m.  Physical Examination:  Physical Exam Vitals reviewed.  Constitutional:      General: She is not in acute distress.    Appearance: She  is well-developed.  HENT:     Right Ear: Tympanic membrane, ear canal and external ear normal.     Left Ear: Tympanic membrane, ear canal and external ear normal.  Eyes:     Extraocular Movements: Extraocular movements intact.     Conjunctiva/sclera: Conjunctivae normal.  Cardiovascular:     Rate and Rhythm: Normal rate and regular rhythm.     Pulses: Normal pulses.     Heart sounds: Normal heart sounds.  Pulmonary:     Effort: Pulmonary effort is normal. No respiratory distress.     Breath sounds: Normal breath sounds.  Chest:     Chest wall: No tenderness.  Abdominal:     General: Bowel sounds are normal.     Palpations: Abdomen is soft.  Musculoskeletal:        General: Normal range of motion.     Cervical back: Normal range of motion and neck supple.     Right lower leg: No edema.     Left lower leg: No edema.  Skin:    General: Skin is warm and dry.  Neurological:     Mental Status: She is alert and oriented to person, place, and time.     Deep Tendon Reflexes: Reflexes are normal and symmetric.  Psychiatric:        Mood and Affect: Mood normal.        Behavior: Behavior normal.        Thought Content: Thought content normal.   ASSESSMENT and PLAN: This visit occurred during the SARS-CoV-2 public health emergency.  Safety protocols were in place, including screening questions prior to the visit, additional usage of staff PPE, and extensive cleaning of exam room while observing appropriate contact time as indicated for  disinfecting solutions.   Charlotte Brooks was seen today for establish care.  Diagnoses and all orders for this visit:  Hypercholesteremia -     Lipid panel  Senile osteoporosis  Chronic bilateral low back pain without sciatica -     meloxicam (MOBIC) 7.5 MG tablet; 1-2tabd once a day as needed for pain, take with food  Atopic dermatitis, unspecified type -     desonide (DESOWEN) 0.05 % cream; Apply 1 application topically 2 (two) times daily.  Weight  gain -     Comprehensive metabolic panel -     HgB 123456 -     TSH  Chronic constipation  DDD (degenerative disc disease), lumbar     Problem List Items Addressed This Visit       Digestive   Chronic constipation    Chronic, last colonoscopy 10/2019 Benefiber and miralax daily Occassional use of laxative        Musculoskeletal and Integument   DDD (degenerative disc disease), lumbar    Chronic back pain, improved with use of meloxicam daily. Advised about risk of GI bleed and decline in renal functions. Advised to take with food and avoid other OTC NSAIDs. meloxicam refill sent      Relevant Medications   meloxicam (MOBIC) 7.5 MG tablet   SENILE OSTEOPOROSIS    Current use of prolia injection Last dexa scan 05/2019: osteopenia, repeat in 2023 Advised to start and maintain vitamin 1000IU daily and calcium '600mg'$  BID       Other Visit Diagnoses     Hypercholesteremia    -  Primary   Relevant Orders   Lipid panel (Completed)   Chronic bilateral low back pain without sciatica       Relevant Medications   meloxicam (MOBIC) 7.5 MG tablet   Atopic dermatitis, unspecified type       Relevant Medications   desonide (DESOWEN) 0.05 % cream   Weight gain       Relevant Orders   Comprehensive metabolic panel (Completed)   HgB A1c (Completed)   TSH (Completed)       Follow up: Return in about 1 year (around 09/30/2021) for CPE (fasting).  Wilfred Lacy, NP

## 2020-10-01 DIAGNOSIS — K5909 Other constipation: Secondary | ICD-10-CM | POA: Insufficient documentation

## 2020-10-01 NOTE — Assessment & Plan Note (Signed)
Chronic, last colonoscopy 10/2019 Benefiber and miralax daily Occassional use of laxative

## 2020-10-01 NOTE — Assessment & Plan Note (Addendum)
Current use of prolia injection Last dexa scan 05/2019: osteopenia, repeat in 2023 Advised to start and maintain vitamin 1000IU daily and calcium '600mg'$  BID

## 2020-10-02 ENCOUNTER — Encounter: Payer: Self-pay | Admitting: Nurse Practitioner

## 2020-10-02 NOTE — Assessment & Plan Note (Signed)
Chronic back pain, improved with use of meloxicam daily. Advised about risk of GI bleed and decline in renal functions. Advised to take with food and avoid other OTC NSAIDs. meloxicam refill sent

## 2020-10-04 ENCOUNTER — Telehealth: Payer: Self-pay | Admitting: Nurse Practitioner

## 2020-10-04 NOTE — Telephone Encounter (Signed)
Returned patients call requested labs be printed off for pick up. Patient aware that lab results will be ready for pick up tomorrow.

## 2020-11-04 ENCOUNTER — Telehealth: Payer: Self-pay | Admitting: Family Medicine

## 2020-11-04 NOTE — Telephone Encounter (Signed)
Left message for patient to call back and schedule Medicare Annual Wellness Visit (AWV) in office.   If not able to come in office, please offer to do virtually or by telephone.  Left office number and my jabber [phone].  Last AWV:07/30/2019  Please schedule at anytime with Nurse Health Advisor.

## 2020-11-17 ENCOUNTER — Ambulatory Visit: Payer: Medicare Other

## 2020-12-15 ENCOUNTER — Ambulatory Visit: Payer: Medicare Other

## 2021-02-10 ENCOUNTER — Other Ambulatory Visit: Payer: Self-pay

## 2021-02-10 ENCOUNTER — Ambulatory Visit (INDEPENDENT_AMBULATORY_CARE_PROVIDER_SITE_OTHER): Payer: Medicare Other

## 2021-02-10 DIAGNOSIS — M810 Age-related osteoporosis without current pathological fracture: Secondary | ICD-10-CM | POA: Diagnosis not present

## 2021-02-10 MED ORDER — DENOSUMAB 60 MG/ML ~~LOC~~ SOSY
60.0000 mg | PREFILLED_SYRINGE | Freq: Once | SUBCUTANEOUS | Status: AC
  Administered 2021-02-10: 60 mg via SUBCUTANEOUS

## 2021-02-10 NOTE — Progress Notes (Signed)
Per the orders of Charlotte Brooks pt is here for 6 mo. Prolia injection. Pt receivd injection in right upper out quadrant of the arm,this was a subcutaneous injection. Given by [place] CMA/CPT at 11 am. pt tolerated injection well. Pt is to return in 6 mo's for next injection.

## 2021-04-12 ENCOUNTER — Telehealth: Payer: Self-pay | Admitting: Rheumatology

## 2021-04-12 NOTE — Telephone Encounter (Signed)
Patient called the office stating a few years ago Dr. Estanislado Pandy wrote her a prescription for a generic Voltaren gel and she would like a refill sent to [facility].  ?

## 2021-04-12 NOTE — Telephone Encounter (Signed)
Patient advised we would be unable to send in a prescription for her as we have not seen her in 2 years. Patient advised she may purchase the Voltaren Gel over the counter at the pharmacy.  ?

## 2021-04-27 ENCOUNTER — Ambulatory Visit: Payer: Medicare Other

## 2021-05-08 ENCOUNTER — Other Ambulatory Visit: Payer: Self-pay | Admitting: Nurse Practitioner

## 2021-05-08 DIAGNOSIS — G8929 Other chronic pain: Secondary | ICD-10-CM

## 2021-05-12 ENCOUNTER — Encounter: Payer: Self-pay | Admitting: Nurse Practitioner

## 2021-05-16 ENCOUNTER — Telehealth: Payer: Self-pay | Admitting: Family Medicine

## 2021-05-16 NOTE — Telephone Encounter (Signed)
Left message for patient to call back and schedule Medicare Annual Wellness Visit (AWV).  ? ?Please offer to do virtually or by telephone.  Left office number and my jabber [phone]. ? ?Last AWV:07/30/2019 ? ?Please schedule at anytime with Nurse Health Advisor. ?  ?

## 2021-05-17 NOTE — Telephone Encounter (Signed)
Chart supports Rx ?Last seen 09/2020 ?Next OV 09/2021 ?

## 2021-08-11 ENCOUNTER — Ambulatory Visit (INDEPENDENT_AMBULATORY_CARE_PROVIDER_SITE_OTHER): Payer: Medicare Other

## 2021-08-11 DIAGNOSIS — M810 Age-related osteoporosis without current pathological fracture: Secondary | ICD-10-CM

## 2021-08-11 MED ORDER — DENOSUMAB 60 MG/ML ~~LOC~~ SOSY
60.0000 mg | PREFILLED_SYRINGE | Freq: Once | SUBCUTANEOUS | Status: AC
  Administered 2021-08-11: 60 mg via SUBCUTANEOUS

## 2021-08-11 NOTE — Progress Notes (Signed)
Per orders of Wilfred Lacy, NP pt is here for Prolia Injection, Injection given by Leonor Liv, CMA. Prolia (med), 60 mg/mL (dose), SubQ (route) was administered in the RT arm (location) today. Patient tolerated injection. Patient next injection due in 6 mos, appt made 02/09/2022 '@9am'$ 

## 2021-08-24 LAB — HM MAMMOGRAPHY

## 2021-09-09 NOTE — Telephone Encounter (Signed)
Na

## 2021-11-24 ENCOUNTER — Telehealth: Payer: Self-pay | Admitting: Nurse Practitioner

## 2021-11-24 NOTE — Telephone Encounter (Signed)
Katlyn called and wanted to know about the prolia that pt got on 1.26.2023 did it come from a a free drug progam or are we paying for it. Corbin Ade # is [phone]

## 2021-11-29 ENCOUNTER — Ambulatory Visit: Payer: Medicare Other | Admitting: Family Medicine

## 2021-12-05 NOTE — Telephone Encounter (Signed)
[employer] checking on status of this message. Cb [phone] case [id number]

## 2021-12-06 NOTE — Telephone Encounter (Signed)
Spoke with [employer] informed that Rx was specialty pharmacy.

## 2022-01-13 ENCOUNTER — Other Ambulatory Visit (HOSPITAL_COMMUNITY): Payer: Self-pay

## 2022-01-20 ENCOUNTER — Telehealth: Payer: Self-pay

## 2022-01-20 NOTE — Telephone Encounter (Signed)
Prolia VOB initiated via [email or web address]  Last Prolia inj: 08/11/21 Next Prolia inj DUE: 02/08/22

## 2022-01-30 ENCOUNTER — Other Ambulatory Visit (HOSPITAL_COMMUNITY): Payer: Self-pay

## 2022-01-30 NOTE — Telephone Encounter (Signed)
Patient Advocate Encounter  Prior Authorization for [place] has been approved.    PA# [id number] Effective dates: 01/27/22 through 01/28/23  Insurance verification completed.    The current 180 day co-pay is, $150.00.   The patient is insured through [place]

## 2022-01-30 NOTE — Telephone Encounter (Signed)
Pt ready for scheduling on or after 02/08/22  Out-of-pocket cost due at time of visit: $0  Primary: [employer] - Medicare Prolia co-insurance: 0% Admin fee co-insurance: 0%  Secondary:  Prolia co-insurance:  Admin fee co-insurance:   Deductible: $0 met of $200 required  Prior Auth: approved PA# [id number] Valid: 01/27/22 to 01/28/23    ** This summary of benefits is an estimation of the patient's out-of-pocket cost. Exact cost may very based on individual plan coverage. Prolia is covered at 100% after $200.00 deductible is met.

## 2022-01-31 NOTE — Telephone Encounter (Signed)
Patient schedule for Prolia injection also wanted to be scheduled for Physical. Both scheduled on separate days.

## 2022-02-09 ENCOUNTER — Ambulatory Visit: Payer: Medicare Other

## 2022-02-14 ENCOUNTER — Ambulatory Visit: Payer: Medicare Other

## 2022-02-14 DIAGNOSIS — M810 Age-related osteoporosis without current pathological fracture: Secondary | ICD-10-CM

## 2022-02-14 MED ORDER — DENOSUMAB 60 MG/ML ~~LOC~~ SOSY
60.0000 mg | PREFILLED_SYRINGE | Freq: Once | SUBCUTANEOUS | Status: AC
  Administered 2022-02-14: 60 mg via SUBCUTANEOUS

## 2022-02-14 NOTE — Progress Notes (Signed)
Per orders of Wilfred Lacy pt is here to receive Prolia Injection, pt received Prolia injection in right arm SubQ by Angeline Slim. Pt tolerated Prolia Injection well. Pt will return in 6 months for next Prolia Injection, scheduled for 08/15/22 at 9:20am.   Medication was office supplied. [place] [phone] LOT [id number] Exp 03/15/2024

## 2022-02-16 ENCOUNTER — Telehealth: Payer: Self-pay | Admitting: Nurse Practitioner

## 2022-02-16 NOTE — Telephone Encounter (Signed)
Left message for patient to call back and schedule Medicare Annual Wellness Visit (AWV) either virtually or in office. Left  my Charlotte Brooks number [phone]   Last AWV 714/21 please schedule with Nurse Health Adviser   45 min for awv-i  in office appointments 30 min for awv-s & awv-i phone/virtual appointments

## 2022-02-17 NOTE — Telephone Encounter (Signed)
Returned patients call She stated she was not ready to schedule right now.  She stated she was not happy with office.  I advise her to call and ask for manager to let them know.  Patient stated she would call back to schedule. And was ok for me to call her in a couple months if she has not called back to schedule

## 2022-03-08 DIAGNOSIS — H6993 Unspecified Eustachian tube disorder, bilateral: Secondary | ICD-10-CM | POA: Insufficient documentation

## 2022-03-22 ENCOUNTER — Encounter: Payer: Medicare Other | Admitting: Nurse Practitioner

## 2022-04-06 ENCOUNTER — Ambulatory Visit (INDEPENDENT_AMBULATORY_CARE_PROVIDER_SITE_OTHER): Payer: Medicare Other | Admitting: Nurse Practitioner

## 2022-04-06 ENCOUNTER — Encounter: Payer: Self-pay | Admitting: Nurse Practitioner

## 2022-04-06 VITALS — BP 130/84 | HR 62 | Temp 97.7°F | Resp 16 | Ht 64.0 in | Wt 157.0 lb

## 2022-04-06 DIAGNOSIS — E7800 Pure hypercholesterolemia, unspecified: Secondary | ICD-10-CM | POA: Diagnosis not present

## 2022-04-06 DIAGNOSIS — M810 Age-related osteoporosis without current pathological fracture: Secondary | ICD-10-CM

## 2022-04-06 DIAGNOSIS — Z0001 Encounter for general adult medical examination with abnormal findings: Secondary | ICD-10-CM

## 2022-04-06 DIAGNOSIS — R7301 Impaired fasting glucose: Secondary | ICD-10-CM

## 2022-04-06 DIAGNOSIS — M170 Bilateral primary osteoarthritis of knee: Secondary | ICD-10-CM

## 2022-04-06 DIAGNOSIS — I739 Peripheral vascular disease, unspecified: Secondary | ICD-10-CM | POA: Diagnosis not present

## 2022-04-06 DIAGNOSIS — R252 Cramp and spasm: Secondary | ICD-10-CM

## 2022-04-06 LAB — COMPREHENSIVE METABOLIC PANEL
ALT: 13 U/L (ref 0–35)
AST: 23 U/L (ref 0–37)
Albumin: 4.3 g/dL (ref 3.5–5.2)
Alkaline Phosphatase: 71 U/L (ref 39–117)
BUN: 19 mg/dL (ref 6–23)
CO2: 29 mEq/L (ref 19–32)
Calcium: 9.4 mg/dL (ref 8.4–10.5)
Chloride: 103 mEq/L (ref 96–112)
Creatinine, Ser: 0.85 mg/dL (ref 0.40–1.20)
GFR: 66.39 mL/min (ref >60.00)
Glucose, Bld: 97 mg/dL (ref 70–99)
Potassium: 4.1 mEq/L (ref 3.5–5.1)
Sodium: 140 mEq/L (ref 135–145)
Total Bilirubin: 0.7 mg/dL (ref 0.2–1.2)
Total Protein: 7.5 g/dL (ref 6.0–8.3)

## 2022-04-06 LAB — MAGNESIUM: Magnesium: 2 mg/dL (ref 1.5–2.5)

## 2022-04-06 LAB — HEMOGLOBIN A1C: Hgb A1c MFr Bld: 5.8 % (ref 4.6–6.5)

## 2022-04-06 MED ORDER — DICLOFENAC SODIUM 1 % EX GEL: 2.0000 g | Freq: Three times a day (TID) | CUTANEOUS | 2 refills | Status: AC | PRN

## 2022-04-06 NOTE — Assessment & Plan Note (Addendum)
ABI completed 2020: normal No claudication, no LE edema, normal distal pulses, normal nails and skin

## 2022-04-06 NOTE — Patient Instructions (Signed)
Go to lab [place] healthy diet and daily exercise. Maintain current medications.  Preventive Care 31 Years and Older, Female Preventive care refers to lifestyle choices and visits with your health care provider that can promote health and wellness. Preventive care visits are also called wellness exams. What can I expect for my preventive care visit? Counseling Your health care provider may ask you questions about your: Medical history, including: Past medical problems. Family medical history. Pregnancy and menstrual history. History of falls. Current health, including: Memory and ability to understand (cognition). Emotional well-being. Home life and relationship well-being. Sexual activity and sexual health. Lifestyle, including: Alcohol, nicotine or tobacco, and drug use. Access to firearms. Diet, exercise, and sleep habits. Work and work Statistician. Sunscreen use. Safety issues such as seatbelt and bike helmet use. Physical exam Your health care provider will check your: Height and weight. These may be used to calculate your BMI (body mass index). BMI is a measurement that tells if you are at a healthy weight. Waist circumference. This measures the distance around your waistline. This measurement also tells if you are at a healthy weight and may help predict your risk of certain diseases, such as type 2 diabetes and high blood pressure. Heart rate and blood pressure. Body temperature. Skin for abnormal spots. What immunizations do I need?  Vaccines are usually given at various ages, according to a schedule. Your health care provider will recommend vaccines for you based on your age, medical history, and lifestyle or other factors, such as travel or where you work. What tests do I need? Screening Your health care provider may recommend screening tests for certain conditions. This may include: Lipid and cholesterol levels. Hepatitis C test. Hepatitis B test. HIV  (human immunodeficiency virus) test. STI (sexually transmitted infection) testing, if you are at risk. Lung cancer screening. Colorectal cancer screening. Diabetes screening. This is done by checking your blood sugar (glucose) after you have not eaten for a while (fasting). Mammogram. Talk with your health care provider about how often you should have regular mammograms. BRCA-related cancer screening. This may be done if you have a family history of breast, ovarian, tubal, or peritoneal cancers. Bone density scan. This is done to screen for osteoporosis. Talk with your health care provider about your test results, treatment options, and if necessary, the need for more tests. Follow these instructions at home: Eating and drinking  Eat a diet that includes fresh fruits and vegetables, whole grains, lean protein, and low-fat dairy products. Limit your intake of foods with high amounts of sugar, saturated fats, and salt. Take vitamin and mineral supplements as recommended by your health care provider. Do not drink alcohol if your health care provider tells you not to drink. If you drink alcohol: Limit how much you have to 0-1 drink a day. Know how much alcohol is in your drink. In the [place] one drink equals one 12 oz bottle of beer (355 mL), one 5 oz glass of wine (148 mL), or one 1 oz glass of hard liquor (44 mL). Lifestyle Brush your teeth every morning and night with fluoride toothpaste. Floss one time each day. Exercise for at least 30 minutes 5 or more days each week. Do not use any products that contain nicotine or tobacco. These products include cigarettes, chewing tobacco, and vaping devices, such as e-cigarettes. If you need help quitting, ask your health care provider. Do not use drugs. If you are sexually active, practice safe sex. Use a condom or other  form of protection in order to prevent STIs. Take aspirin only as told by your health care provider. Make sure that you understand  how much to take and what form to take. Work with your health care provider to find out whether it is safe and beneficial for you to take aspirin daily. Ask your health care provider if you need to take a cholesterol-lowering medicine (statin). Find healthy ways to manage stress, such as: Meditation, yoga, or listening to music. Journaling. Talking to a trusted person. Spending time with friends and family. Minimize exposure to UV radiation to reduce your risk of skin cancer. Safety Always wear your seat belt while driving or riding in a vehicle. Do not drive: If you have been drinking alcohol. Do not ride with someone who has been drinking. When you are tired or distracted. While texting. If you have been using any mind-altering substances or drugs. Wear a helmet and other protective equipment during sports activities. If you have firearms in your house, make sure you follow all gun safety procedures. What's next? Visit your health care provider once a year for an annual wellness visit. Ask your health care provider how often you should have your eyes and teeth checked. Stay up to date on all vaccines. This information is not intended to replace advice given to you by your health care provider. Make sure you discuss any questions you have with your health care provider. Document Revised: 06/30/2020 Document Reviewed: 06/30/2020 Elsevier Patient Education  [place].

## 2022-04-06 NOTE — Progress Notes (Signed)
Complete physical exam  Patient: Charlotte Brooks   DOB: 1945/04/22   77 y.o. Female  MRN: [id number] Visit Date: 04/06/2022  Subjective:    Chief Complaint  Patient presents with   Annual Exam    Fasting- Yes   Pt would like a refill of diclofenac 100g    Charlotte Brooks is a 77 y.o. female who presents today for a complete physical exam. She reports consuming a low fat and low sodium diet. Home exercise routine includes calisthenics, stretching, walking 1 hrs per day, and yoga. She generally feels well. She reports sleeping well. She does not have additional problems to discuss today.  Vision:Yes Dental:Yes STD Screen:No  BP Readings from Last 3 Encounters:  04/06/22 130/84  09/30/20 124/60  11/05/19 134/80   Wt Readings from Last 3 Encounters:  04/06/22 157 lb (71.2 kg)  09/30/20 158 lb (71.7 kg)  11/05/19 148 lb (67.1 kg)   Most recent fall risk assessment:    04/06/2022    8:20 AM  [place] in the past year? 0  Number falls in past yr: 0  Injury with Fall? 0  Risk for fall due to : No Fall Risks  Follow up Falls evaluation completed   Depression screen:Yes - No Depression  Most recent depression screenings:    04/06/2022    8:20 AM 09/30/2020    9:07 AM  PHQ 2/9 Scores  PHQ - 2 Score 0 0  PHQ- 9 Score  4   HPI  Asymptomatic PVD (peripheral vascular disease) (HCC) ABI completed 2020: normal No claudication, no LE edema, normal distal pulses, normal nails and skin  Senile osteoporosis Current use of prolia every 31months No adverse effects Unable to tolerate oral calcium supplement: makes constipation worse Takes vit. D 1000IU daily. Repeat dexa scan and CMP  Primary osteoarthritis of both knees Use of voltaren gel prn  Past Medical History:  Diagnosis Date   Arthritis    Constipation    Osteoporosis    Gets Reclast   Past Surgical History:  Procedure Laterality Date   COLONOSCOPY     HERNIA REPAIR  2/08   KNEE ARTHROPLASTY      lateral release    KNEE SURGERY     POLYPECTOMY     Social History   Socioeconomic History   Marital status: Married    Spouse name: [name]   Number of children: 4   Years of education: Not on file   Highest education level: Not on file  Occupational History   Occupation: retired  Tobacco Use   Smoking status: Never   Smokeless tobacco: Never  Vaping Use   Vaping Use: Never used  Substance and Sexual Activity   Alcohol use: No    Comment: OCC WINE1-2 yearly   Drug use: Never   Sexual activity: Yes    Partners: Male  Other Topics Concern   Not on file  Social History Narrative   Exercise-- water aerobics, yoga, machines   Social Determinants of Health   Financial Resource Strain: Low Risk  (07/30/2019)   Overall Financial Resource Strain (CARDIA)    Difficulty of Paying Living Expenses: Not hard at all  Food Insecurity: No Food Insecurity (07/30/2019)   Hunger Vital Sign    Worried About Running Out of Food in the Last Year: Never true    [place] in the Last Year: Never true  Transportation Needs: No Transportation Needs (07/30/2019)   [place] -  Hydrologist (Medical): No    Lack of Transportation (Non-Medical): No  Physical Activity: Sufficiently Active (07/30/2019)   Exercise Vital Sign    Days of Exercise per Week: 7 days    Minutes of Exercise per Session: 60 min  Stress: No Stress Concern Present (07/30/2019)   [place]    Feeling of Stress : Not at all  Social Connections: [place] (07/30/2019)   Social Connection and Isolation Panel [NHANES]    Frequency of Communication with Friends and Family: More than three times a week    Frequency of Social Gatherings with Friends and Family: Once a week    Attends Religious Services: More than 4 times per year    Active Member of [employer] or Organizations: Yes    Attends Music therapist: More than  4 times per year    Marital Status: Married  Human resources officer Violence: Not At Risk (07/30/2019)   Humiliation, Afraid, Rape, and Kick questionnaire    Fear of Current or Ex-Partner: No    Emotionally Abused: No    Physically Abused: No    Sexually Abused: No   Family Status  Relation Name Status   Mother  Deceased at age 23   Father  Deceased at age 65       MVA   Brother  Deceased   Other  (Not Specified)   MGM  (Not Specified)   MGF  (Not Specified)   Neg Hx  (Not Specified)   Family History  Problem Relation Age of Onset   Stroke Mother    Hypertension Mother    Diabetes Brother    Heart disease Brother    Coronary artery disease Other    Diabetes Maternal Grandmother    Diabetes Maternal Grandfather    Colon cancer Neg Hx    Esophageal cancer Neg Hx    Pancreatic cancer Neg Hx    Rectal cancer Neg Hx    Stomach cancer Neg Hx    Colon polyps Neg Hx    Allergies  Allergen Reactions   Penicillins Anaphylaxis and Swelling    Has patient had a PCN reaction causing immediate rash, facial/tongue/throat swelling, SOB or lightheadedness with hypotension: Unknown Has patient had a PCN reaction causing severe rash involving mucus membranes or skin necrosis: Unknown Has patient had a PCN reaction that required hospitalization Unknown Has patient had a PCN reaction occurring within the last 10 years: No  If all of the above answers are "NO", then may proceed with Cephalosporin use.    Codeine Nausea And Vomiting   Sulfonamide Derivatives Rash    Patient Care Team: Quince Santana, Charlene Brooke, NP as PCP - General (Internal Medicine) Mauri Pole, MD as Consulting Physician (Gastroenterology) Murlean Iba, MD as Consulting Physician (Orthopedic Surgery) Nada Libman, MD as Consulting Physician (Ophthalmology) Benay Pillow, DDS as Consulting Physician (Dentistry) Gaynelle Arabian, MD as Consulting Physician (Orthopedic Surgery)   Medications: Outpatient  Medications Prior to Visit  Medication Sig   Biotin 5000 MCG CAPS Take 2 capsules by mouth daily.    Cholecalciferol (VITAMIN D PO) Take 1,000 Units by mouth daily.   denosumab (PROLIA) 60 MG/ML SOLN injection Inject 60 mg into the skin every 6 (six) months. Administer in upper arm, thigh, or abdomen   meloxicam (MOBIC) 7.5 MG tablet TAKE 1 TO 2 TABLETS BY  MOUTH ONCE DAILY WITH FOOD  AS NEEDED FOR PAIN  Misc Natural Products (TART CHERRY ADVANCED PO) Take by mouth.   Omega-3 Fatty Acids (FISH OIL) 1000 MG CAPS Take by mouth.   OVER THE COUNTER MEDICATION Swiss Kriss herbal laxative 2-4 daily   polyethylene glycol powder (GLYCOLAX/MIRALAX) powder USING MEASURING CAP MIX  17GM IN WATER AND DRINK  EVERY MORNING   vitamin C (ASCORBIC ACID) 500 MG tablet Take 1,000 mg by mouth daily.    Wheat Dextrin (BENEFIBER PO) Take by mouth. Two tablespoons twice a day   [DISCONTINUED] desonide (DESOWEN) 0.05 % cream Apply 1 application topically 2 (two) times daily.   No facility-administered medications prior to visit.    Review of Systems  Constitutional:  Negative for activity change, appetite change and unexpected weight change.  Respiratory: Negative.    Cardiovascular: Negative.   Gastrointestinal: Negative.   Endocrine: Negative for cold intolerance and heat intolerance.  Genitourinary: Negative.   Musculoskeletal: Negative.   Skin: Negative.   Neurological: Negative.   Hematological: Negative.   Psychiatric/Behavioral:  Negative for behavioral problems, decreased concentration, dysphoric mood, hallucinations, self-injury, sleep disturbance and suicidal ideas. The patient is not nervous/anxious.        Objective:  BP 130/84 (BP Location: Left Arm, Patient Position: Sitting, Cuff Size: Normal)   Pulse 62   Temp 97.7 F (36.5 C) (Temporal)   Resp 16   Ht 5\' 4"  (1.626 m)   Wt 157 lb (71.2 kg)   SpO2 99%   BMI 26.95 kg/m     Physical Exam Vitals and nursing note reviewed.   Constitutional:      General: She is not in acute distress. HENT:     Right Ear: Tympanic membrane, ear canal and external ear normal.     Left Ear: Tympanic membrane, ear canal and external ear normal.     Nose: Nose normal.  Eyes:     Extraocular Movements: Extraocular movements intact.     Conjunctiva/sclera: Conjunctivae normal.     Pupils: Pupils are equal, round, and reactive to light.  Neck:     Thyroid: No thyroid mass, thyromegaly or thyroid tenderness.  Cardiovascular:     Rate and Rhythm: Normal rate and regular rhythm.     Pulses: Normal pulses.     Heart sounds: Normal heart sounds.  Pulmonary:     Effort: Pulmonary effort is normal.     Breath sounds: Normal breath sounds.  Abdominal:     General: Bowel sounds are normal.     Palpations: Abdomen is soft.  Musculoskeletal:        General: Normal range of motion.     Cervical back: Normal range of motion and neck supple.     Right lower leg: No edema.     Left lower leg: No edema.  Lymphadenopathy:     Cervical: No cervical adenopathy.  Skin:    General: Skin is warm and dry.  Neurological:     Mental Status: She is alert and oriented to person, place, and time.     Cranial Nerves: No cranial nerve deficit.  Psychiatric:        Mood and Affect: Mood normal.        Behavior: Behavior normal.        Thought Content: Thought content normal.     No results found for any visits on 04/06/22.    Assessment & Plan:    Routine Health Maintenance and Physical Exam  Immunization History  Administered Date(s) Administered   Moderna Sars-Covid-2 Vaccination 03/03/2019, 04/01/2019  Zoster Recombinat (Shingrix) 10/16/2016, 04/07/2017   Zoster, Live 05/01/2007   Health Maintenance  Topic Date Due   DTaP/Tdap/Td (1 - Tdap) Never done   COVID-19 Vaccine (3 - Moderna risk series) 04/29/2019   Medicare Annual Wellness (AWV)  07/29/2020   DEXA SCAN  05/21/2021   INFLUENZA VACCINE  04/16/2022 (Originally 08/16/2021)    Pneumonia Vaccine 36+ Years old (1 of 1 - PCV) 04/06/2023 (Originally 06/30/2010)   COLONOSCOPY (Pts 45-30yrs Insurance coverage will need to be confirmed)  11/05/2022   Hepatitis C Screening  Completed   Zoster Vaccines- Shingrix  Completed   HPV VACCINES  Aged Out   Discussed health benefits of physical activity, and encouraged her to engage in regular exercise appropriate for her age and condition.  Problem List Items Addressed This Visit       Cardiovascular and Mediastinum   Asymptomatic PVD (peripheral vascular disease) ([place])    ABI completed 2020: normal No claudication, no LE edema, normal distal pulses, normal nails and skin        Musculoskeletal and Integument   Primary osteoarthritis of both knees    Use of voltaren gel prn      Relevant Medications   diclofenac Sodium (VOLTAREN) 1 % GEL   Senile osteoporosis    Current use of prolia every 35months No adverse effects Unable to tolerate oral calcium supplement: makes constipation worse Takes vit. D 1000IU daily. Repeat dexa scan and CMP      Relevant Orders   DG BONE DENSITY (DXA)   Other Visit Diagnoses     Encounter for preventative adult health care exam with abnormal findings    -  Primary   Relevant Orders   Comprehensive metabolic panel   Hypercholesterolemia       Muscle cramps at night       Relevant Orders   Magnesium   Impaired fasting glucose       Relevant Orders   Hemoglobin A1c      Return in about 1 year (around 04/06/2023) for CPE (fasting).     Wilfred Lacy, NP

## 2022-04-06 NOTE — Assessment & Plan Note (Signed)
Current use of prolia every 75months No adverse effects Unable to tolerate oral calcium supplement: makes constipation worse Takes vit. D 1000IU daily. Repeat dexa scan and CMP

## 2022-04-06 NOTE — Assessment & Plan Note (Signed)
Use of voltaren gel prn

## 2022-04-06 NOTE — Progress Notes (Signed)
Stable Follow instructions as discussed during office visit.

## 2022-04-26 ENCOUNTER — Encounter: Payer: Self-pay | Admitting: Nurse Practitioner

## 2022-04-26 ENCOUNTER — Telehealth: Payer: Self-pay | Admitting: Nurse Practitioner

## 2022-04-26 NOTE — Telephone Encounter (Signed)
Called patient to schedule Medicare Annual Wellness Visit (AWV). Left message for patient to call back and schedule Medicare Annual Wellness Visit (AWV).  Last date of AWV: 07/30/19  Please schedule an appointment at any time with [facility].  If any questions, please contact me at [phone].  Thank you ,  Rudell Cobb AWV direct phone # [phone]

## 2022-04-26 NOTE — Telephone Encounter (Signed)
Bone density order printed for PCP to sign and faxed over to [facility].

## 2022-05-01 ENCOUNTER — Encounter: Payer: Self-pay | Admitting: *Deleted

## 2022-05-19 LAB — HM DEXA SCAN

## 2022-05-22 ENCOUNTER — Encounter: Payer: Self-pay | Admitting: Nurse Practitioner

## 2022-05-30 ENCOUNTER — Ambulatory Visit
Admission: RE | Admit: 2022-05-30 | Discharge: 2022-05-30 | Disposition: A | Discharge status: Home / Self Care | Payer: Medicare Other | Source: Ambulatory Visit | Attending: Sports Medicine | Admitting: Sports Medicine

## 2022-05-30 ENCOUNTER — Ambulatory Visit: Payer: Medicare Other | Admitting: Sports Medicine

## 2022-05-30 VITALS — BP 148/86 | Ht 65.5 in | Wt 153.0 lb

## 2022-05-30 DIAGNOSIS — M545 Low back pain, unspecified: Secondary | ICD-10-CM

## 2022-05-30 MED ORDER — TRAMADOL HCL 50 MG PO TABS: 50.0000 mg | ORAL_TABLET | Freq: Four times a day (QID) | ORAL | 0 refills | Status: DC | PRN

## 2022-05-30 MED ORDER — PREDNISONE 10 MG PO TABS: ORAL_TABLET | ORAL | 0 refills | Status: DC

## 2022-05-30 MED ORDER — KETOROLAC TROMETHAMINE 60 MG/2ML IM SOLN
60.0000 mg | Freq: Once | INTRAMUSCULAR | Status: AC
  Administered 2022-05-30: 60 mg via INTRAMUSCULAR

## 2022-05-30 MED ORDER — METHYLPREDNISOLONE ACETATE 80 MG/ML IJ SUSP
80.0000 mg | Freq: Once | INTRAMUSCULAR | Status: AC
  Administered 2022-05-30: 80 mg via INTRAMUSCULAR

## 2022-05-30 NOTE — Addendum Note (Signed)
Addended by: Annita Brod on: 05/30/2022 11:24 AM   Modules accepted: Orders

## 2022-05-30 NOTE — Progress Notes (Signed)
   Subjective:    Patient ID: Charlotte Brooks, female    DOB: 1945/08/16, 77 y.o.   MRN: [id number]  HPI chief complaint: Low back pain  Patient is a very pleasant 77 year old female that presents today complaining of sudden onset left-sided low back pain that began this past Sunday.  She has had intermittent pain in the past but nothing quite this severe.  Pain will radiate down the leg to the knee.  Yesterday, she was getting some radiating pain into the lower leg as well.  Her symptoms are much more noticeable with standing and walking.  She is more comfortable sitting and bending forward.  She tried over-the-counter Tylenol and ibuprofen.  They both helped a little bit but were not curative.  No known trauma.  Past medical history reviewed.  It is significant for osteoporosis Medications reviewed Allergies reviewed    Review of Systems As above    Objective:   Physical Exam  Well-developed, well-nourished.  No acute distress  Lumbar spine: There is no tenderness to palpation along the lumbar midline.  No tenderness to percussion along the lumbar midline.  She is tender to palpation along the left paraspinal musculature.  No significant spasm.  Pain is present with standing and walking.  Pain is less with sitting.  Negative straight leg raise.  Her strength is 5/5 in both lower extremities.  Reflexes are brisk and equal at the Achilles and patellar tendons bilaterally.       Assessment & Plan:   Left-sided low back pain-question facet arthropathy.  Rule out compression fracture  Given her history of osteoporosis, I want get x-rays of her lumbar spine to rule out compression fracture.  We will inject her today with 80 mg of Depo-Medrol IM, and 60 mg of Toradol IM for her acute pain.  I have also provided prescriptions for a 6-day Sterapred Dosepak to take as directed and tramadol to take every 6 hours as needed for pain.  She will use these medications if today's injections are  ineffective.  She will follow-up with me in the office again in 1 week.  If she has improved, then we will consider physical therapy.  If not, consider further diagnostic imaging if x-ray is unremarkable.  This note was dictated using Dragon naturally speaking software and may contain errors in syntax, spelling, or content which have not been identified prior to signing this note.

## 2022-05-31 ENCOUNTER — Other Ambulatory Visit: Payer: Self-pay | Admitting: Nurse Practitioner

## 2022-05-31 DIAGNOSIS — G8929 Other chronic pain: Secondary | ICD-10-CM

## 2022-06-05 ENCOUNTER — Other Ambulatory Visit: Payer: Self-pay

## 2022-06-05 ENCOUNTER — Telehealth: Payer: Self-pay | Admitting: Nurse Practitioner

## 2022-06-05 NOTE — Telephone Encounter (Signed)
[employer] [id number] ref # [id number] Wants to know if prolia for this pt is purchased from a specialty pharmacy or our stock? Do we buy it and then charge the pt's insurance.

## 2022-06-05 NOTE — Telephone Encounter (Signed)
Called [employer] back and gave provided them with the requested information

## 2022-06-06 ENCOUNTER — Ambulatory Visit (INDEPENDENT_AMBULATORY_CARE_PROVIDER_SITE_OTHER): Payer: Medicare Other | Admitting: Sports Medicine

## 2022-06-06 VITALS — BP 144/72 | Ht 65.5 in | Wt 153.0 lb

## 2022-06-06 DIAGNOSIS — M545 Low back pain, unspecified: Secondary | ICD-10-CM | POA: Diagnosis not present

## 2022-06-06 MED ORDER — PREDNISONE 10 MG PO TABS
ORAL_TABLET | ORAL | 0 refills | Status: DC
Start: 2022-06-06 — End: 2022-06-06

## 2022-06-06 NOTE — Progress Notes (Signed)
Patient ID: Charlotte Brooks, female   DOB: [birth year], 77 y.o.   MRN: [id number]  Patient presents today for follow-up on acute low back pain.  She is feeling much better.  Prednisone was helpful but she did experience some jitteriness and insomnia with it.  She only took a couple of doses of the tramadol.  X-rays of her lumbar spine showed multilevel degenerative changes with the most pronounced change at L3-L4.  Physical exam was not repeated today.  We simply talked about treatment going forward.  I recommended that she start physical therapy and she may wean to a home exercise program per the therapist discretion.  She may also resume some light walking.  She takes meloxicam 7.5 mg for other aches and pains.  I advised her to start with 15 mg of meloxicam in the future if she has a return of severe low back pain.  I will also go ahead and call in a refill on her Sterapred Dosepak.  She will simply keep that on hand and use it only if necessary.  She still has tramadol as well.  Hopefully she will continue to recover and she will follow-up with me for ongoing or recalcitrant issues.  This note was dictated using Dragon naturally speaking software and may contain errors in syntax, spelling, or content which have not been identified prior to signing this note.

## 2022-06-19 NOTE — Therapy (Signed)
OUTPATIENT PHYSICAL THERAPY THORACOLUMBAR EVALUATION   Patient Name: Charlotte Brooks MRN: [id number] DOB:02/17/1945, 77 y.o., female Today's Date: 06/20/2022  END OF SESSION:  PT End of Session - 06/20/22 0927     Visit Number 1    Date for PT Re-Evaluation 08/15/22    Authorization Type [employer]    PT Start Time 0927    PT Stop Time 1010    PT Time Calculation (min) 43 min    Activity Tolerance Patient tolerated treatment well    Behavior During Therapy [facility] for tasks assessed/performed             Past Medical History:  Diagnosis Date   Arthritis    Constipation    Osteoporosis    Gets Reclast   Past Surgical History:  Procedure Laterality Date   COLONOSCOPY     HERNIA REPAIR  2/08   KNEE ARTHROPLASTY     lateral release    KNEE SURGERY     POLYPECTOMY     Patient Active Problem List   Diagnosis Date Noted   Asymptomatic PVD (peripheral vascular disease) (HCC) 04/06/2022   Dysfunction of both eustachian tubes 03/08/2022   Chronic constipation 10/01/2020   Cherry angioma 05/03/2020   Seborrheic keratoses 05/03/2020   Multiple benign nevi 05/03/2020   Atypical glandular cells of undetermined significance (AGUS) on cervical Pap smear 11/15/2017   DDD (degenerative disc disease), lumbar 06/28/2017   Chronic SI joint pain 06/28/2017   Primary osteoarthritis of both hands 06/28/2017   Primary osteoarthritis of both knees 06/28/2017   Senile osteoporosis 05/18/2009   Persistent disorder of initiating or maintaining sleep 05/31/2006    PCP: Alysia Penna  REFERRING PROVIDER: Reino Bellis  REFERRING DIAG:  M54.50 (ICD-10-CM) - Acute left-sided low back pain, unspecified whether sciatica present    Rationale for Evaluation and Treatment: Rehabilitation  THERAPY DIAG:  Radiculopathy, lumbar region  Other low back pain  Muscle weakness (generalized)  Muscle spasm of back  ONSET DATE: 06/06/22  SUBJECTIVE:                                                                                                                                                                                            SUBJECTIVE STATEMENT: Well I can walk. I have always had trouble with my low back for years. Mothers day weekend I woke up and I don't know what happened but when I woke up the pain was intense and I could not walk. It has gotten better since then.   PERTINENT HISTORY:  Patient presents today for follow-up on acute low back pain.  She is feeling  much better.  Prednisone was helpful but she did experience some jitteriness and insomnia with it.  She only took a couple of doses of the tramadol.  X-rays of her lumbar spine showed multilevel degenerative changes with the most pronounced change at L3-L4.   She takes meloxicam 7.5 mg for other aches and pains.  I advised her to start with 15 mg of meloxicam in the future if she has a return of severe low back pain.     PAIN:  Are you having pain? Yes: NPRS scale: 4/10 Pain location: low back, L buttock and radiates down leg  Pain description: grabs me and almost takes my breath, it comes and goes, worse in the AM Aggravating factors: not anything really  Relieving factors: eases up the more I move around  PRECAUTIONS: None  WEIGHT BEARING RESTRICTIONS: No  FALLS:  Has patient fallen in last 6 months? No  LIVING ENVIRONMENT: Lives with: lives with their spouse Lives in: House/apartment   OCCUPATION: Retired  PLOF: Independent  PATIENT GOALS: to get back to my life as it was working out and walking and gardening   NEXT MD VISIT: 08/15/22  OBJECTIVE:   DIAGNOSTIC FINDINGS:  There are five non-rib bearing lumbar-type vertebral bodies with riblets at T12. There is dextrocurvature of the lumbar spine with multilevel asymmetric disc space height loss. There is straightening of the lumbar lordosis with trace anterolisthesis of L1-2, L4-5 and L5-S1 and mild retrolisthesis of L2-3. There is no  evidence for acute fracture or subluxation. Severe intervertebral disc space height loss with reactive endplate sclerosis and subcortical cystic change at L3-4. Moderate to severe intervertebral disc space height loss at L2-3 and L4-5. Atherosclerotic calcifications.   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  PATIENT SURVEYS:  FOTO 51  SCREENING FOR RED FLAGS: Bowel or bladder incontinence: No Spinal tumors: No Cauda equina syndrome: No Compression fracture: No Abdominal aneurysm: No  COGNITION: Overall cognitive status: Within functional limits for tasks assessed     SENSATION: WFL  MUSCLE LENGTH: Hamstrings: BLE tightness  POSTURE: No Significant postural limitations  PALPATION: No remarkable findings  LUMBAR ROM:   AROM eval  Flexion 75% with pain  Extension WFL  Right lateral flexion WFL  Left lateral flexion WFL  Right rotation WFL  Left rotation WFL   (Blank rows = not tested)  LOWER EXTREMITY ROM:   grossly WFL   LOWER EXTREMITY MMT:  grossly 5/5   LUMBAR SPECIAL TESTS:  Straight leg raise test: Positive, Slump test: Negative, and FABER test: Positive  FUNCTIONAL TESTS:  5 times sit to stand: 12.82s with pain   TODAY'S TREATMENT:                                                                                                                              DATE: 06/20/22- EVAL    PATIENT EDUCATION:  Education details: POC and HEP Person educated:  Patient Education method: Explanation Education comprehension: verbalized understanding  HOME EXERCISE PROGRAM: Access Code: [email or web address] URL: [email or web address] Date: 06/20/2022 Prepared by: Cassie Freer  Exercises - Supine Lower Trunk Rotation  - 1 x daily - 7 x weekly - 2 sets - 10 reps - Supine Bridge  - 1 x daily - 7 x weekly - 2 sets - 10 reps - Supine Piriformis Stretch with Foot on Ground  - 1 x daily - 7 x weekly - 2 sets - 30 hold -  Supine Figure 4 Piriformis Stretch  - 1 x daily - 7 x weekly - 2 sets - 30 hold - Supine Single Knee to Chest Stretch  - 1 x daily - 7 x weekly - 2 sets - 30 hold - Seated Hamstring Stretch  - 1 x daily - 7 x weekly - 2 sets - 30 hold  ASSESSMENT:  CLINICAL IMPRESSION: Patient is a 77 y.o. female who was seen today for physical therapy evaluation and treatment for LBP. She reports her pain began Mother's Day weekend when she got up in the morning. Since then her pain has improved but not completely resolved. She states she is very active and works out every day but the doctor told her to take it slow so she has started walking a little bit again. She was doing resistance training 3x/wk and yoga 2x a week. Patient presents with good overall strength and mobility but does have some tightness in her hamstrings and hip rotators. She will benefit from skilled PT be able to get back to her recreational activities and gardening.  OBJECTIVE IMPAIRMENTS: decreased ROM and pain.   ACTIVITY LIMITATIONS: bending, squatting, and locomotion level  PARTICIPATION LIMITATIONS: community activity and yard work  [facility] POTENTIAL: Good  CLINICAL DECISION MAKING: Stable/uncomplicated  EVALUATION COMPLEXITY: Low   GOALS: Goals reviewed with patient? Yes  SHORT TERM GOALS: Target date: 07/18/22  Patient will be independent with initial HEP.  Goal status: INITIAL  2.  Patient will report centralization of radicular symptoms.  Baseline: radiates down LLE, does not go past knee Goal status: INITIAL  3. Patient will complete 5xSTS <12s without pain  Baseline: 12.82s with pain in LB Goal status: INITIAL  LONG TERM GOALS: Target date: 08/15/22  Patient will be independent with advanced/ongoing HEP to improve outcomes and carryover.  Goal status: INITIAL  2.  Patient will report 75% improvement in low back pain to improve QOL.  Baseline: 4/10 Goal status: INITIAL  3.  Patient will demonstrate full pain  free lumbar ROM to perform ADLs.   Baseline: see chart Goal status: INITIAL  4.  Patient will report 4 on lumbar FOTO to demonstrate improved functional ability.  Baseline: 51 Goal status: INITIAL   5.  Patient will be able to return to her normal exercises routine and gardening without pain Baseline: resistance training 3x/wk, yoga 2x a wk, walking every day Goal status: INITIAL   PLAN:  PT FREQUENCY: 2x/week  PT DURATION: 8 weeks  PLANNED INTERVENTIONS: Therapeutic exercises, Therapeutic activity, Neuromuscular re-education, Balance training, Gait training, Patient/Family education, Self Care, Joint mobilization, Dry Needling, Electrical stimulation, Spinal mobilization, Cryotherapy, Moist heat, Traction, Ultrasound, and Manual therapy.  PLAN FOR NEXT SESSION: stretching esp for hips,  gym activities for low back and core strengthening    Cassie Freer, PT 06/20/2022, 10:13 AM

## 2022-06-20 ENCOUNTER — Ambulatory Visit: Payer: Medicare Other | Attending: Sports Medicine

## 2022-06-20 DIAGNOSIS — M62830 Muscle spasm of back: Secondary | ICD-10-CM

## 2022-06-20 DIAGNOSIS — M5416 Radiculopathy, lumbar region: Secondary | ICD-10-CM | POA: Diagnosis present

## 2022-06-20 DIAGNOSIS — M6281 Muscle weakness (generalized): Secondary | ICD-10-CM

## 2022-06-20 DIAGNOSIS — M545 Low back pain, unspecified: Secondary | ICD-10-CM | POA: Diagnosis not present

## 2022-06-20 DIAGNOSIS — M5459 Other low back pain: Secondary | ICD-10-CM | POA: Diagnosis present

## 2022-06-26 ENCOUNTER — Ambulatory Visit: Payer: Medicare Other | Admitting: Physical Therapy

## 2022-06-26 ENCOUNTER — Encounter: Payer: Self-pay | Admitting: Physical Therapy

## 2022-06-26 DIAGNOSIS — M5416 Radiculopathy, lumbar region: Secondary | ICD-10-CM

## 2022-06-26 DIAGNOSIS — M62830 Muscle spasm of back: Secondary | ICD-10-CM

## 2022-06-26 DIAGNOSIS — M6281 Muscle weakness (generalized): Secondary | ICD-10-CM

## 2022-06-26 DIAGNOSIS — M5459 Other low back pain: Secondary | ICD-10-CM

## 2022-06-26 NOTE — Therapy (Signed)
OUTPATIENT PHYSICAL THERAPY THORACOLUMBAR EVALUATION   Patient Name: Charlotte Brooks MRN: [id number] DOB:[birth year], 77 y.o., female Today's Date: 06/26/2022  END OF SESSION:  PT End of Session - 06/26/22 1015     Visit Number 2    Date for PT Re-Evaluation 08/15/22    PT Start Time 1015    PT Stop Time 1100    PT Time Calculation (min) 45 min    Activity Tolerance Patient tolerated treatment well    Behavior During Therapy [facility] for tasks assessed/performed             Past Medical History:  Diagnosis Date   Arthritis    Constipation    Osteoporosis    Gets Reclast   Past Surgical History:  Procedure Laterality Date   COLONOSCOPY     HERNIA REPAIR  2/08   KNEE ARTHROPLASTY     lateral release    KNEE SURGERY     POLYPECTOMY     Patient Active Problem List   Diagnosis Date Noted   Asymptomatic PVD (peripheral vascular disease) (HCC) 04/06/2022   Dysfunction of both eustachian tubes 03/08/2022   Chronic constipation 10/01/2020   Cherry angioma 05/03/2020   Seborrheic keratoses 05/03/2020   Multiple benign nevi 05/03/2020   Atypical glandular cells of undetermined significance (AGUS) on cervical Pap smear 11/15/2017   DDD (degenerative disc disease), lumbar 06/28/2017   Chronic SI joint pain 06/28/2017   Primary osteoarthritis of both hands 06/28/2017   Primary osteoarthritis of both knees 06/28/2017   Senile osteoporosis 05/18/2009   Persistent disorder of initiating or maintaining sleep 05/31/2006    PCP: Alysia Penna  REFERRING PROVIDER: Reino Bellis  REFERRING DIAG:  M54.50 (ICD-10-CM) - Acute left-sided low back pain, unspecified whether sciatica present    Rationale for Evaluation and Treatment: Rehabilitation  THERAPY DIAG:  Radiculopathy, lumbar region  Other low back pain  Muscle weakness (generalized)  Muscle spasm of back  ONSET DATE: 06/06/22  SUBJECTIVE:                                                                                                                                                                                            SUBJECTIVE STATEMENT: Intense pain in the morning when she wakes up, eases up as she started moving around.   PERTINENT HISTORY:  Patient presents today for follow-up on acute low back pain.  She is feeling much better.  Prednisone was helpful but she did experience some jitteriness and insomnia with it.  She only took a couple of doses of the tramadol.  X-rays of her lumbar spine showed multilevel degenerative changes with  the most pronounced change at L3-L4.   She takes meloxicam 7.5 mg for other aches and pains.  I advised her to start with 15 mg of meloxicam in the future if she has a return of severe low back pain.     PAIN:  Are you having pain? Yes: NPRS scale: 4/10 Pain location: low back, L buttock and radiates down leg  Pain description: grabs me and almost takes my breath, it comes and goes, worse in the AM Aggravating factors: not anything really  Relieving factors: eases up the more I move around  PRECAUTIONS: None  WEIGHT BEARING RESTRICTIONS: No  FALLS:  Has patient fallen in last 6 months? No  LIVING ENVIRONMENT: Lives with: lives with their spouse Lives in: House/apartment   OCCUPATION: Retired  PLOF: Independent  PATIENT GOALS: to get back to my life as it was working out and walking and gardening   NEXT MD VISIT: 08/15/22  OBJECTIVE:   DIAGNOSTIC FINDINGS:  There are five non-rib bearing lumbar-type vertebral bodies with riblets at T12. There is dextrocurvature of the lumbar spine with multilevel asymmetric disc space height loss. There is straightening of the lumbar lordosis with trace anterolisthesis of L1-2, L4-5 and L5-S1 and mild retrolisthesis of L2-3. There is no evidence for acute fracture or subluxation. Severe intervertebral disc space height loss with reactive endplate sclerosis and subcortical cystic change at L3-4. Moderate to severe  intervertebral disc space height loss at L2-3 and L4-5. Atherosclerotic calcifications.   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  PATIENT SURVEYS:  FOTO 51  SCREENING FOR RED FLAGS: Bowel or bladder incontinence: No Spinal tumors: No Cauda equina syndrome: No Compression fracture: No Abdominal aneurysm: No  COGNITION: Overall cognitive status: Within functional limits for tasks assessed     SENSATION: WFL  MUSCLE LENGTH: Hamstrings: BLE tightness  POSTURE: No Significant postural limitations  PALPATION: No remarkable findings  LUMBAR ROM:   AROM eval  Flexion 75% with pain  Extension WFL  Right lateral flexion WFL  Left lateral flexion WFL  Right rotation WFL  Left rotation WFL   (Blank rows = not tested)  LOWER EXTREMITY ROM:   grossly WFL   LOWER EXTREMITY MMT:  grossly 5/5   LUMBAR SPECIAL TESTS:  Straight leg raise test: Positive, Slump test: Negative, and FABER test: Positive  FUNCTIONAL TESTS:  5 times sit to stand: 12.82s with pain   TODAY'S TREATMENT:                                                                                                                              DATE:  06/26/22 Bike L5 x 6 min S2S OHP red ball 2x10 Rows & Lats 20lb 2x10 Bridges x10 LE on Pball bridges, oblq, K2C Passive stretching HS, Piriformis, TB, Single K2C, Double K2C, lower trunk rotaions  06/20/22- EVAL    PATIENT EDUCATION:  Education details: POC and HEP Person educated:  Patient Education method: Explanation Education comprehension: verbalized understanding  HOME EXERCISE PROGRAM: Access Code: [email or web address] URL: [email or web address] Date: 06/20/2022 Prepared by: Cassie Freer  Exercises - Supine Lower Trunk Rotation  - 1 x daily - 7 x weekly - 2 sets - 10 reps - Supine Bridge  - 1 x daily - 7 x weekly - 2 sets - 10 reps - Supine Piriformis Stretch with Foot on Ground  - 1 x daily - 7 x  weekly - 2 sets - 30 hold - Supine Figure 4 Piriformis Stretch  - 1 x daily - 7 x weekly - 2 sets - 30 hold - Supine Single Knee to Chest Stretch  - 1 x daily - 7 x weekly - 2 sets - 30 hold - Seated Hamstring Stretch  - 1 x daily - 7 x weekly - 2 sets - 30 hold  ASSESSMENT:  CLINICAL IMPRESSION: Patient is a 77 y.o. female who was seen today for physical therapy e treatment for LBP.  She states she is very active and works out every day but the doctor told her to take it slow so she has started walking a little bit again. Session consisted of some postural core strength. No issue completing todays interventions. No increase in pain during session. Some L LE tightness noted compared o the R. Cue for core engagaemtnt needed with supine bridges.. She will benefit from skilled PT be able to get back to her recreational activities and gardening.  OBJECTIVE IMPAIRMENTS: decreased ROM and pain.   ACTIVITY LIMITATIONS: bending, squatting, and locomotion level  PARTICIPATION LIMITATIONS: community activity and yard work  [facility] POTENTIAL: Good  CLINICAL DECISION MAKING: Stable/uncomplicated  EVALUATION COMPLEXITY: Low   GOALS: Goals reviewed with patient? Yes  SHORT TERM GOALS: Target date: 07/18/22  Patient will be independent with initial HEP.  Goal status: INITIAL  2.  Patient will report centralization of radicular symptoms.  Baseline: radiates down LLE, does not go past knee Goal status: INITIAL  3. Patient will complete 5xSTS <12s without pain  Baseline: 12.82s with pain in LB Goal status: INITIAL  LONG TERM GOALS: Target date: 08/15/22  Patient will be independent with advanced/ongoing HEP to improve outcomes and carryover.  Goal status: INITIAL  2.  Patient will report 75% improvement in low back pain to improve QOL.  Baseline: 4/10 Goal status: INITIAL  3.  Patient will demonstrate full pain free lumbar ROM to perform ADLs.   Baseline: see chart Goal status:  INITIAL  4.  Patient will report 34 on lumbar FOTO to demonstrate improved functional ability.  Baseline: 51 Goal status: INITIAL   5.  Patient will be able to return to her normal exercises routine and gardening without pain Baseline: resistance training 3x/wk, yoga 2x a wk, walking every day Goal status: INITIAL   PLAN:  PT FREQUENCY: 2x/week  PT DURATION: 8 weeks  PLANNED INTERVENTIONS: Therapeutic exercises, Therapeutic activity, Neuromuscular re-education, Balance training, Gait training, Patient/Family education, Self Care, Joint mobilization, Dry Needling, Electrical stimulation, Spinal mobilization, Cryotherapy, Moist heat, Traction, Ultrasound, and Manual therapy.  PLAN FOR NEXT SESSION: stretching esp for hips,  gym activities for low back and core strengthening    Grayce Sessions, PTA 06/26/2022, 10:16 AM

## 2022-06-28 ENCOUNTER — Encounter: Payer: Self-pay | Admitting: Physical Therapy

## 2022-06-28 ENCOUNTER — Ambulatory Visit: Payer: Medicare Other | Admitting: Physical Therapy

## 2022-06-28 DIAGNOSIS — M5459 Other low back pain: Secondary | ICD-10-CM

## 2022-06-28 DIAGNOSIS — M6281 Muscle weakness (generalized): Secondary | ICD-10-CM

## 2022-06-28 DIAGNOSIS — M5416 Radiculopathy, lumbar region: Secondary | ICD-10-CM

## 2022-06-28 NOTE — Therapy (Signed)
OUTPATIENT PHYSICAL THERAPY THORACOLUMBAR EVALUATION   Patient Name: Charlotte Brooks MRN: [id number] DOB:02-18-1945, 77 y.o., female Today's Date: 06/28/2022  END OF SESSION:  PT End of Session - 06/28/22 1016     Visit Number 3    Date for PT Re-Evaluation 08/15/22    PT Start Time 1016    PT Stop Time 1100    PT Time Calculation (min) 44 min    Activity Tolerance Patient tolerated treatment well    Behavior During Therapy [facility] for tasks assessed/performed             Past Medical History:  Diagnosis Date   Arthritis    Constipation    Osteoporosis    Gets Reclast   Past Surgical History:  Procedure Laterality Date   COLONOSCOPY     HERNIA REPAIR  2/08   KNEE ARTHROPLASTY     lateral release    KNEE SURGERY     POLYPECTOMY     Patient Active Problem List   Diagnosis Date Noted   Asymptomatic PVD (peripheral vascular disease) (HCC) 04/06/2022   Dysfunction of both eustachian tubes 03/08/2022   Chronic constipation 10/01/2020   Cherry angioma 05/03/2020   Seborrheic keratoses 05/03/2020   Multiple benign nevi 05/03/2020   Atypical glandular cells of undetermined significance (AGUS) on cervical Pap smear 11/15/2017   DDD (degenerative disc disease), lumbar 06/28/2017   Chronic SI joint pain 06/28/2017   Primary osteoarthritis of both hands 06/28/2017   Primary osteoarthritis of both knees 06/28/2017   Senile osteoporosis 05/18/2009   Persistent disorder of initiating or maintaining sleep 05/31/2006    PCP: Alysia Penna  REFERRING PROVIDER: Reino Bellis  REFERRING DIAG:  M54.50 (ICD-10-CM) - Acute left-sided low back pain, unspecified whether sciatica present    Rationale for Evaluation and Treatment: Rehabilitation  THERAPY DIAG:  Other low back pain  Muscle weakness (generalized)  Radiculopathy, lumbar region  ONSET DATE: 06/06/22  SUBJECTIVE:                                                                                                                                                                                            SUBJECTIVE STATEMENT: "Im hurting" yesterday afternoon the pain started to be unbearable. Pt sated she may have walked too far earlier yesterday   PERTINENT HISTORY:  Patient presents today for follow-up on acute low back pain.  She is feeling much better.  Prednisone was helpful but she did experience some jitteriness and insomnia with it.  She only took a couple of doses of the tramadol.  X-rays of her lumbar spine showed multilevel degenerative changes with the  most pronounced change at L3-L4.   She takes meloxicam 7.5 mg for other aches and pains.  I advised her to start with 15 mg of meloxicam in the future if she has a return of severe low back pain.     PAIN:  Are you having pain? Yes: NPRS scale: 5/10 Pain location: low back, L buttock and radiates down leg  Pain description: grabs me and almost takes my breath, it comes and goes, worse in the AM Aggravating factors: not anything really  Relieving factors: eases up the more I move around  PRECAUTIONS: None  WEIGHT BEARING RESTRICTIONS: No  FALLS:  Has patient fallen in last 6 months? No  LIVING ENVIRONMENT: Lives with: lives with their spouse Lives in: House/apartment   OCCUPATION: Retired  PLOF: Independent  PATIENT GOALS: to get back to my life as it was working out and walking and gardening   NEXT MD VISIT: 08/15/22  OBJECTIVE:   DIAGNOSTIC FINDINGS:  There are five non-rib bearing lumbar-type vertebral bodies with riblets at T12. There is dextrocurvature of the lumbar spine with multilevel asymmetric disc space height loss. There is straightening of the lumbar lordosis with trace anterolisthesis of L1-2, L4-5 and L5-S1 and mild retrolisthesis of L2-3. There is no evidence for acute fracture or subluxation. Severe intervertebral disc space height loss with reactive endplate sclerosis and subcortical cystic change at L3-4.  Moderate to severe intervertebral disc space height loss at L2-3 and L4-5. Atherosclerotic calcifications.   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  PATIENT SURVEYS:  FOTO 51  SCREENING FOR RED FLAGS: Bowel or bladder incontinence: No Spinal tumors: No Cauda equina syndrome: No Compression fracture: No Abdominal aneurysm: No  COGNITION: Overall cognitive status: Within functional limits for tasks assessed     SENSATION: WFL  MUSCLE LENGTH: Hamstrings: BLE tightness  POSTURE: No Significant postural limitations  PALPATION: No remarkable findings  LUMBAR ROM:   AROM eval  Flexion 75% with pain  Extension WFL  Right lateral flexion WFL  Left lateral flexion WFL  Right rotation WFL  Left rotation WFL   (Blank rows = not tested)  LOWER EXTREMITY ROM:   grossly WFL   LOWER EXTREMITY MMT:  grossly 5/5   LUMBAR SPECIAL TESTS:  Straight leg raise test: Positive, Slump test: Negative, and FABER test: Positive  FUNCTIONAL TESTS:  5 times sit to stand: 12.82s with pain   TODAY'S TREATMENT:                                                                                                                              DATE:  06/28/22 Bike L4 x 6 min   MHP to lumbar spine  Lower trunk rotations  LE on Pball bridges, oblq, K2C  Passive stretching HS, Piriformis, ITB, Single K2C, Double K2C, lower trunk rotations, glute STM to L glute and lumbar area with thera gun   06/26/22 Bike L5 x  6 min S2S OHP red ball 2x10 Rows & Lats 20lb 2x10 Bridges x10 LE on Pball bridges, oblq, K2C Passive stretching HS, Piriformis, TB, Single K2C, Double K2C, lower trunk rotaions  06/20/22- EVAL    PATIENT EDUCATION:  Education details: POC and HEP Person educated: Patient Education method: Explanation Education comprehension: verbalized understanding  HOME EXERCISE PROGRAM: Access Code: [email or web address] URL:  [email or web address] Date: 06/20/2022 Prepared by: Cassie Freer  Exercises - Supine Lower Trunk Rotation  - 1 x daily - 7 x weekly - 2 sets - 10 reps - Supine Bridge  - 1 x daily - 7 x weekly - 2 sets - 10 reps - Supine Piriformis Stretch with Foot on Ground  - 1 x daily - 7 x weekly - 2 sets - 30 hold - Supine Figure 4 Piriformis Stretch  - 1 x daily - 7 x weekly - 2 sets - 30 hold - Supine Single Knee to Chest Stretch  - 1 x daily - 7 x weekly - 2 sets - 30 hold - Seated Hamstring Stretch  - 1 x daily - 7 x weekly - 2 sets - 30 hold  ASSESSMENT:  CLINICAL IMPRESSION: Patient is a 77 y.o. female who was seen today for physical therapy e treatment for LBP.  She states she is very active and works out every day but the doctor told her to take it slow so she has started walking a little bit again. Session consisted of a more passive approach due to subjective reports of pain. Cue for cire engagement needed with supine interventions. Some tissue density noted in the lumbar para spinales. Positive response to session evident by subjective reports of decrease pain. She will benefit from skilled PT be able to get back to her recreational activities and gardening.  OBJECTIVE IMPAIRMENTS: decreased ROM and pain.   ACTIVITY LIMITATIONS: bending, squatting, and locomotion level  PARTICIPATION LIMITATIONS: community activity and yard work  [facility] POTENTIAL: Good  CLINICAL DECISION MAKING: Stable/uncomplicated  EVALUATION COMPLEXITY: Low   GOALS: Goals reviewed with patient? Yes  SHORT TERM GOALS: Target date: 07/18/22  Patient will be independent with initial HEP.  Goal status: Met 06/28/22  2.  Patient will report centralization of radicular symptoms.  Baseline: radiates down LLE, does not go past knee Goal status: INITIAL  3. Patient will complete 5xSTS <12s without pain  Baseline: 12.82s with pain in LB Goal status: INITIAL  LONG TERM GOALS: Target date:  08/15/22  Patient will be independent with advanced/ongoing HEP to improve outcomes and carryover.  Goal status: INITIAL  2.  Patient will report 75% improvement in low back pain to improve QOL.  Baseline: 4/10 Goal status: INITIAL  3.  Patient will demonstrate full pain free lumbar ROM to perform ADLs.   Baseline: see chart Goal status: INITIAL  4.  Patient will report 59 on lumbar FOTO to demonstrate improved functional ability.  Baseline: 51 Goal status: INITIAL   5.  Patient will be able to return to her normal exercises routine and gardening without pain Baseline: resistance training 3x/wk, yoga 2x a wk, walking every day Goal status: INITIAL   PLAN:  PT FREQUENCY: 2x/week  PT DURATION: 8 weeks  PLANNED INTERVENTIONS: Therapeutic exercises, Therapeutic activity, Neuromuscular re-education, Balance training, Gait training, Patient/Family education, Self Care, Joint mobilization, Dry Needling, Electrical stimulation, Spinal mobilization, Cryotherapy, Moist heat, Traction, Ultrasound, and Manual therapy.  PLAN FOR NEXT SESSION: stretching esp for hips,  gym activities for low back and core strengthening  Grayce Sessions, PTA 06/28/2022, 10:16 AM

## 2022-06-29 NOTE — Therapy (Signed)
OUTPATIENT PHYSICAL THERAPY THORACOLUMBAR TREATMENT   Patient Name: Charlotte Brooks MRN: [id number] DOB:04-20-1945, 77 y.o., female Today's Date: 07/03/2022  END OF SESSION:  PT End of Session - 07/03/22 0839     Visit Number 4    Date for PT Re-Evaluation 08/15/22    PT Start Time 0840    PT Stop Time 0925    PT Time Calculation (min) 45 min    Activity Tolerance Patient tolerated treatment well    Behavior During Therapy [facility] for tasks assessed/performed              Past Medical History:  Diagnosis Date   Arthritis    Constipation    Osteoporosis    Gets Reclast   Past Surgical History:  Procedure Laterality Date   COLONOSCOPY     HERNIA REPAIR  2/08   KNEE ARTHROPLASTY     lateral release    KNEE SURGERY     POLYPECTOMY     Patient Active Problem List   Diagnosis Date Noted   Asymptomatic PVD (peripheral vascular disease) (HCC) 04/06/2022   Dysfunction of both eustachian tubes 03/08/2022   Chronic constipation 10/01/2020   Cherry angioma 05/03/2020   Seborrheic keratoses 05/03/2020   Multiple benign nevi 05/03/2020   Atypical glandular cells of undetermined significance (AGUS) on cervical Pap smear 11/15/2017   DDD (degenerative disc disease), lumbar 06/28/2017   Chronic SI joint pain 06/28/2017   Primary osteoarthritis of both hands 06/28/2017   Primary osteoarthritis of both knees 06/28/2017   Senile osteoporosis 05/18/2009   Persistent disorder of initiating or maintaining sleep 05/31/2006    PCP: Alysia Penna  REFERRING PROVIDER: Reino Bellis  REFERRING DIAG:  M54.50 (ICD-10-CM) - Acute left-sided low back pain, unspecified whether sciatica present    Rationale for Evaluation and Treatment: Rehabilitation  THERAPY DIAG:  Other low back pain  Muscle weakness (generalized)  Radiculopathy, lumbar region  Muscle spasm of back  ONSET DATE: 06/06/22  SUBJECTIVE:                                                                                                                                                                                            SUBJECTIVE STATEMENT: I had some pain this morning but I did some stretches so it is not that bad right now.   PERTINENT HISTORY:  Patient presents today for follow-up on acute low back pain.  She is feeling much better.  Prednisone was helpful but she did experience some jitteriness and insomnia with it.  She only took a couple of doses of the tramadol.  X-rays of her lumbar spine showed  multilevel degenerative changes with the most pronounced change at L3-L4.   She takes meloxicam 7.5 mg for other aches and pains.  I advised her to start with 15 mg of meloxicam in the future if she has a return of severe low back pain.     PAIN:  Are you having pain? Yes: NPRS scale: 5/10 Pain location: low back, L buttock and radiates down leg  Pain description: grabs me and almost takes my breath, it comes and goes, worse in the AM Aggravating factors: not anything really  Relieving factors: eases up the more I move around  PRECAUTIONS: None  WEIGHT BEARING RESTRICTIONS: No  FALLS:  Has patient fallen in last 6 months? No  LIVING ENVIRONMENT: Lives with: lives with their spouse Lives in: House/apartment   OCCUPATION: Retired  PLOF: Independent  PATIENT GOALS: to get back to my life as it was working out and walking and gardening   NEXT MD VISIT: 08/15/22  OBJECTIVE:   DIAGNOSTIC FINDINGS:  There are five non-rib bearing lumbar-type vertebral bodies with riblets at T12. There is dextrocurvature of the lumbar spine with multilevel asymmetric disc space height loss. There is straightening of the lumbar lordosis with trace anterolisthesis of L1-2, L4-5 and L5-S1 and mild retrolisthesis of L2-3. There is no evidence for acute fracture or subluxation. Severe intervertebral disc space height loss with reactive endplate sclerosis and subcortical cystic change at L3-4. Moderate to  severe intervertebral disc space height loss at L2-3 and L4-5. Atherosclerotic calcifications.   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  PATIENT SURVEYS:  FOTO 51  SCREENING FOR RED FLAGS: Bowel or bladder incontinence: No Spinal tumors: No Cauda equina syndrome: No Compression fracture: No Abdominal aneurysm: No  COGNITION: Overall cognitive status: Within functional limits for tasks assessed     SENSATION: WFL  MUSCLE LENGTH: Hamstrings: BLE tightness  POSTURE: No Significant postural limitations  PALPATION: No remarkable findings  LUMBAR ROM:   AROM eval  Flexion 75% with pain  Extension WFL  Right lateral flexion WFL  Left lateral flexion WFL  Right rotation WFL  Left rotation WFL   (Blank rows = not tested)  LOWER EXTREMITY ROM:   grossly WFL   LOWER EXTREMITY MMT:  grossly 5/5   LUMBAR SPECIAL TESTS:  Straight leg raise test: Positive, Slump test: Negative, and FABER test: Positive  FUNCTIONAL TESTS:  5 times sit to stand: 12.82s with pain   TODAY'S TREATMENT:                                                                                                                              DATE:  07/03/22 NuStep L5 x92mins  Passive stretching HS, Piriformis, ITB, Single K2C, Double K2C, lower trunk rotations, glutes laying on MH Feet on ball pall rotations and knees to chest x10 Bridges 2x10 Clamshells green 2x10 STS with yellow ball OHP 2x10  Rows and lat pull downs  20# 2x10 Black TB ext 2x10   06/28/22 Bike L4 x 6 min   MHP to lumbar spine  Lower trunk rotations  LE on Pball bridges, oblq, K2C  Passive stretching HS, Piriformis, ITB, Single K2C, Double K2C, lower trunk rotations, glute STM to L glute and lumbar area with thera gun   06/26/22 Bike L5 x 6 min S2S OHP red ball 2x10 Rows & Lats 20lb 2x10 Bridges x10 LE on Pball bridges, oblq, K2C Passive stretching HS, Piriformis, TB, Single K2C,  Double K2C, lower trunk rotaions  06/20/22- EVAL    PATIENT EDUCATION:  Education details: POC and HEP Person educated: Patient Education method: Explanation Education comprehension: verbalized understanding  HOME EXERCISE PROGRAM: Access Code: [email or web address] URL: [email or web address] Date: 06/20/2022 Prepared by: Cassie Freer  Exercises - Supine Lower Trunk Rotation  - 1 x daily - 7 x weekly - 2 sets - 10 reps - Supine Bridge  - 1 x daily - 7 x weekly - 2 sets - 10 reps - Supine Piriformis Stretch with Foot on Ground  - 1 x daily - 7 x weekly - 2 sets - 30 hold - Supine Figure 4 Piriformis Stretch  - 1 x daily - 7 x weekly - 2 sets - 30 hold - Supine Single Knee to Chest Stretch  - 1 x daily - 7 x weekly - 2 sets - 30 hold - Seated Hamstring Stretch  - 1 x daily - 7 x weekly - 2 sets - 30 hold  ASSESSMENT:  CLINICAL IMPRESSION: Patient returns with some minor complaints of back pain but reports it has gotten some better unless she does too much in one day. Continued with some mobility and stretching. Added in some weights today as tolerated. Positive response to session evident by subjective reports of decrease pain. She will benefit from skilled PT be able to get back to her recreational activities and gardening.  OBJECTIVE IMPAIRMENTS: decreased ROM and pain.   ACTIVITY LIMITATIONS: bending, squatting, and locomotion level  PARTICIPATION LIMITATIONS: community activity and yard work  [facility] POTENTIAL: Good  CLINICAL DECISION MAKING: Stable/uncomplicated  EVALUATION COMPLEXITY: Low   GOALS: Goals reviewed with patient? Yes  SHORT TERM GOALS: Target date: 07/18/22  Patient will be independent with initial HEP.  Goal status: Met 06/28/22  2.  Patient will report centralization of radicular symptoms.  Baseline: radiates down LLE, does not go past knee Goal status: INITIAL  3. Patient will complete 5xSTS <12s without pain  Baseline: 12.82s with pain in LB Goal  status: INITIAL  LONG TERM GOALS: Target date: 08/15/22  Patient will be independent with advanced/ongoing HEP to improve outcomes and carryover.  Goal status: INITIAL  2.  Patient will report 75% improvement in low back pain to improve QOL.  Baseline: 4/10 Goal status: INITIAL  3.  Patient will demonstrate full pain free lumbar ROM to perform ADLs.   Baseline: see chart Goal status: INITIAL  4.  Patient will report 59 on lumbar FOTO to demonstrate improved functional ability.  Baseline: 51 Goal status: INITIAL   5.  Patient will be able to return to her normal exercises routine and gardening without pain Baseline: resistance training 3x/wk, yoga 2x a wk, walking every day Goal status: INITIAL   PLAN:  PT FREQUENCY: 2x/week  PT DURATION: 8 weeks  PLANNED INTERVENTIONS: Therapeutic exercises, Therapeutic activity, Neuromuscular re-education, Balance training, Gait training, Patient/Family education, Self Care, Joint mobilization, Dry Needling, Electrical stimulation, Spinal mobilization, Cryotherapy, Moist heat, Traction, Ultrasound, and  Manual therapy.  PLAN FOR NEXT SESSION: stretching esp for hips,  gym activities for low back and core strengthening    Cassie Freer, PT 07/03/2022, 9:25 AM

## 2022-07-03 ENCOUNTER — Ambulatory Visit: Payer: Medicare Other

## 2022-07-03 DIAGNOSIS — M5416 Radiculopathy, lumbar region: Secondary | ICD-10-CM

## 2022-07-03 DIAGNOSIS — M62830 Muscle spasm of back: Secondary | ICD-10-CM

## 2022-07-03 DIAGNOSIS — M5459 Other low back pain: Secondary | ICD-10-CM

## 2022-07-03 DIAGNOSIS — M6281 Muscle weakness (generalized): Secondary | ICD-10-CM

## 2022-07-04 NOTE — Therapy (Signed)
OUTPATIENT PHYSICAL THERAPY THORACOLUMBAR TREATMENT   Patient Name: Charlotte Brooks MRN: [id number] DOB:19-Jun-1945, 77 y.o., female Today's Date: 07/04/2022  END OF SESSION:     Past Medical History:  Diagnosis Date   Arthritis    Constipation    Osteoporosis    Gets Reclast   Past Surgical History:  Procedure Laterality Date   COLONOSCOPY     HERNIA REPAIR  2/08   KNEE ARTHROPLASTY     lateral release    KNEE SURGERY     POLYPECTOMY     Patient Active Problem List   Diagnosis Date Noted   Asymptomatic PVD (peripheral vascular disease) (HCC) 04/06/2022   Dysfunction of both eustachian tubes 03/08/2022   Chronic constipation 10/01/2020   Cherry angioma 05/03/2020   Seborrheic keratoses 05/03/2020   Multiple benign nevi 05/03/2020   Atypical glandular cells of undetermined significance (AGUS) on cervical Pap smear 11/15/2017   DDD (degenerative disc disease), lumbar 06/28/2017   Chronic SI joint pain 06/28/2017   Primary osteoarthritis of both hands 06/28/2017   Primary osteoarthritis of both knees 06/28/2017   Senile osteoporosis 05/18/2009   Persistent disorder of initiating or maintaining sleep 05/31/2006    PCP: Alysia Penna  REFERRING PROVIDER: Reino Bellis  REFERRING DIAG:  M54.50 (ICD-10-CM) - Acute left-sided low back pain, unspecified whether sciatica present    Rationale for Evaluation and Treatment: Rehabilitation  THERAPY DIAG:  No diagnosis found.  ONSET DATE: 06/06/22  SUBJECTIVE:                                                                                                                                                                                           SUBJECTIVE STATEMENT: My back is not that bad its more in my glutes. I am working hard still, when I first get up the pain is unbelievable.   PERTINENT HISTORY:  Patient presents today for follow-up on acute low back pain.  She is feeling much better.  Prednisone was helpful  but she did experience some jitteriness and insomnia with it.  She only took a couple of doses of the tramadol.  X-rays of her lumbar spine showed multilevel degenerative changes with the most pronounced change at L3-L4.   She takes meloxicam 7.5 mg for other aches and pains.  I advised her to start with 15 mg of meloxicam in the future if she has a return of severe low back pain.     PAIN:  Are you having pain? Yes: NPRS scale: 5/10 Pain location: low back, L buttock and radiates down leg  Pain description: grabs me and almost takes my breath, it comes  and goes, worse in the AM Aggravating factors: not anything really  Relieving factors: eases up the more I move around  PRECAUTIONS: None  WEIGHT BEARING RESTRICTIONS: No  FALLS:  Has patient fallen in last 6 months? No  LIVING ENVIRONMENT: Lives with: lives with their spouse Lives in: House/apartment   OCCUPATION: Retired  PLOF: Independent  PATIENT GOALS: to get back to my life as it was working out and walking and gardening   NEXT MD VISIT: 08/15/22  OBJECTIVE:   DIAGNOSTIC FINDINGS:  There are five non-rib bearing lumbar-type vertebral bodies with riblets at T12. There is dextrocurvature of the lumbar spine with multilevel asymmetric disc space height loss. There is straightening of the lumbar lordosis with trace anterolisthesis of L1-2, L4-5 and L5-S1 and mild retrolisthesis of L2-3. There is no evidence for acute fracture or subluxation. Severe intervertebral disc space height loss with reactive endplate sclerosis and subcortical cystic change at L3-4. Moderate to severe intervertebral disc space height loss at L2-3 and L4-5. Atherosclerotic calcifications.   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  PATIENT SURVEYS:  FOTO 51  SCREENING FOR RED FLAGS: Bowel or bladder incontinence: No Spinal tumors: No Cauda equina syndrome: No Compression fracture: No Abdominal aneurysm:  No  COGNITION: Overall cognitive status: Within functional limits for tasks assessed     SENSATION: WFL  MUSCLE LENGTH: Hamstrings: BLE tightness  POSTURE: No Significant postural limitations  PALPATION: No remarkable findings  LUMBAR ROM:   AROM eval  Flexion 75% with pain  Extension WFL  Right lateral flexion WFL  Left lateral flexion WFL  Right rotation WFL  Left rotation WFL   (Blank rows = not tested)  LOWER EXTREMITY ROM:   grossly WFL   LOWER EXTREMITY MMT:  grossly 5/5   LUMBAR SPECIAL TESTS:  Straight leg raise test: Positive, Slump test: Negative, and FABER test: Positive  FUNCTIONAL TESTS:  5 times sit to stand: 12.82s with pain   TODAY'S TREATMENT:                                                                                                                              DATE:  07/05/22 Walk outdoors around back building  Leg ext 10# 2x10 HS curls 25# 2x10 Calf stretch 30s  Calf raises 2x12  blackTB flexion 2x10  NuStep L6 x40mins AR press 10# 2x10  Leg press 30# 2x10  STM to low back and glutes with gun    07/03/22 NuStep L5 x33mins  Passive stretching HS, Piriformis, ITB, Single K2C, Double K2C, lower trunk rotations, glutes laying on MH Feet on ball pall rotations and knees to chest x10 Bridges 2x10 Clamshells green 2x10 STS with yellow ball OHP 2x10  Rows and lat pull downs 20# 2x10 Black TB ext 2x10   06/28/22 Bike L4 x 6 min   MHP to lumbar spine  Lower trunk rotations  LE on Pball bridges, oblq, K2C  Passive stretching HS, Piriformis, ITB, Single K2C, Double K2C, lower trunk rotations, glute STM to L glute and lumbar area with thera gun   06/26/22 Bike L5 x 6 min S2S OHP red ball 2x10 Rows & Lats 20lb 2x10 Bridges x10 LE on Pball bridges, oblq, K2C Passive stretching HS, Piriformis, TB, Single K2C, Double K2C, lower trunk rotaions  06/20/22- EVAL    PATIENT EDUCATION:  Education details: POC and HEP Person educated:  Patient Education method: Explanation Education comprehension: verbalized understanding  HOME EXERCISE PROGRAM: Access Code: [email or web address] URL: [email or web address] Date: 06/20/2022 Prepared by: Cassie Freer  Exercises - Supine Lower Trunk Rotation  - 1 x daily - 7 x weekly - 2 sets - 10 reps - Supine Bridge  - 1 x daily - 7 x weekly - 2 sets - 10 reps - Supine Piriformis Stretch with Foot on Ground  - 1 x daily - 7 x weekly - 2 sets - 30 hold - Supine Figure 4 Piriformis Stretch  - 1 x daily - 7 x weekly - 2 sets - 30 hold - Supine Single Knee to Chest Stretch  - 1 x daily - 7 x weekly - 2 sets - 30 hold - Seated Hamstring Stretch  - 1 x daily - 7 x weekly - 2 sets - 30 hold  ASSESSMENT:  CLINICAL IMPRESSION: Patient returns with ongoing back pain and reports it radiate it into her left leg.  Added in LE and and added in core strengthening today. Some tightness with STM to low back and glutes, might try DN next visit to see if that provides any relief.    OBJECTIVE IMPAIRMENTS: decreased ROM and pain.   ACTIVITY LIMITATIONS: bending, squatting, and locomotion level  PARTICIPATION LIMITATIONS: community activity and yard work  [facility] POTENTIAL: Good  CLINICAL DECISION MAKING: Stable/uncomplicated  EVALUATION COMPLEXITY: Low   GOALS: Goals reviewed with patient? Yes  SHORT TERM GOALS: Target date: 07/18/22  Patient will be independent with initial HEP.  Goal status: Met 06/28/22  2.  Patient will report centralization of radicular symptoms.  Baseline: radiates down LLE, does not go past knee Goal status: INITIAL  3. Patient will complete 5xSTS <12s without pain  Baseline: 12.82s with pain in LB Goal status: INITIAL  LONG TERM GOALS: Target date: 08/15/22  Patient will be independent with advanced/ongoing HEP to improve outcomes and carryover.  Goal status: INITIAL  2.  Patient will report 75% improvement in low back pain to improve QOL.  Baseline:  4/10 Goal status: INITIAL  3.  Patient will demonstrate full pain free lumbar ROM to perform ADLs.   Baseline: see chart Goal status: INITIAL  4.  Patient will report 72 on lumbar FOTO to demonstrate improved functional ability.  Baseline: 51 Goal status: INITIAL   5.  Patient will be able to return to her normal exercises routine and gardening without pain Baseline: resistance training 3x/wk, yoga 2x a wk, walking every day Goal status: INITIAL   PLAN:  PT FREQUENCY: 2x/week  PT DURATION: 8 weeks  PLANNED INTERVENTIONS: Therapeutic exercises, Therapeutic activity, Neuromuscular re-education, Balance training, Gait training, Patient/Family education, Self Care, Joint mobilization, Dry Needling, Electrical stimulation, Spinal mobilization, Cryotherapy, Moist heat, Traction, Ultrasound, and Manual therapy.  PLAN FOR NEXT SESSION: stretching esp for hips,  gym activities for low back and core strengthening    Cassie Freer, PT 07/04/2022, 4:28 PM

## 2022-07-05 ENCOUNTER — Ambulatory Visit: Payer: Medicare Other

## 2022-07-05 DIAGNOSIS — M5416 Radiculopathy, lumbar region: Secondary | ICD-10-CM | POA: Diagnosis not present

## 2022-07-05 DIAGNOSIS — M6281 Muscle weakness (generalized): Secondary | ICD-10-CM

## 2022-07-05 DIAGNOSIS — M5459 Other low back pain: Secondary | ICD-10-CM

## 2022-07-05 DIAGNOSIS — M62830 Muscle spasm of back: Secondary | ICD-10-CM

## 2022-07-06 NOTE — Therapy (Signed)
OUTPATIENT PHYSICAL THERAPY THORACOLUMBAR TREATMENT   Patient Name: Charlotte Brooks MRN: [id number] DOB:[birth year], 77 y.o., female Today's Date: 07/10/2022  END OF SESSION:  PT End of Session - 07/10/22 0932     Visit Number 6    Date for PT Re-Evaluation 08/15/22    PT Start Time 0930    PT Stop Time 1015    PT Time Calculation (min) 45 min    Activity Tolerance Patient tolerated treatment well    Behavior During Therapy [facility] for tasks assessed/performed               Past Medical History:  Diagnosis Date   Arthritis    Constipation    Osteoporosis    Gets Reclast   Past Surgical History:  Procedure Laterality Date   COLONOSCOPY     HERNIA REPAIR  2/08   KNEE ARTHROPLASTY     lateral release    KNEE SURGERY     POLYPECTOMY     Patient Active Problem List   Diagnosis Date Noted   Asymptomatic PVD (peripheral vascular disease) (HCC) 04/06/2022   Dysfunction of both eustachian tubes 03/08/2022   Chronic constipation 10/01/2020   Cherry angioma 05/03/2020   Seborrheic keratoses 05/03/2020   Multiple benign nevi 05/03/2020   Atypical glandular cells of undetermined significance (AGUS) on cervical Pap smear 11/15/2017   DDD (degenerative disc disease), lumbar 06/28/2017   Chronic SI joint pain 06/28/2017   Primary osteoarthritis of both hands 06/28/2017   Primary osteoarthritis of both knees 06/28/2017   Senile osteoporosis 05/18/2009   Persistent disorder of initiating or maintaining sleep 05/31/2006    PCP: Alysia Penna  REFERRING PROVIDER: Reino Bellis  REFERRING DIAG:  M54.50 (ICD-10-CM) - Acute left-sided low back pain, unspecified whether sciatica present    Rationale for Evaluation and Treatment: Rehabilitation  THERAPY DIAG:  Other low back pain  Muscle weakness (generalized)  Radiculopathy, lumbar region  Muscle spasm of back  ONSET DATE: 06/06/22  SUBJECTIVE:                                                                                                                                                                                            SUBJECTIVE STATEMENT: Doing pretty good, been doing more things. Couldn't tell if it made it any worse. Still worse in the mornings and gets better throughout the day. Deep ache feeling.   PERTINENT HISTORY:  Patient presents today for follow-up on acute low back pain.  She is feeling much better.  Prednisone was helpful but she did experience some jitteriness and insomnia with it.  She only took a couple of  doses of the tramadol.  X-rays of her lumbar spine showed multilevel degenerative changes with the most pronounced change at L3-L4.   She takes meloxicam 7.5 mg for other aches and pains.  I advised her to start with 15 mg of meloxicam in the future if she has a return of severe low back pain.     PAIN:  Are you having pain? Yes: NPRS scale: 5/10 Pain location: low back, L buttock and radiates down leg  Pain description: grabs me and almost takes my breath, it comes and goes, worse in the AM Aggravating factors: not anything really  Relieving factors: eases up the more I move around  PRECAUTIONS: None  WEIGHT BEARING RESTRICTIONS: No  FALLS:  Has patient fallen in last 6 months? No  LIVING ENVIRONMENT: Lives with: lives with their spouse Lives in: House/apartment   OCCUPATION: Retired  PLOF: Independent  PATIENT GOALS: to get back to my life as it was working out and walking and gardening   NEXT MD VISIT: 08/15/22  OBJECTIVE:   DIAGNOSTIC FINDINGS:  There are five non-rib bearing lumbar-type vertebral bodies with riblets at T12. There is dextrocurvature of the lumbar spine with multilevel asymmetric disc space height loss. There is straightening of the lumbar lordosis with trace anterolisthesis of L1-2, L4-5 and L5-S1 and mild retrolisthesis of L2-3. There is no evidence for acute fracture or subluxation. Severe intervertebral disc space height loss with  reactive endplate sclerosis and subcortical cystic change at L3-4. Moderate to severe intervertebral disc space height loss at L2-3 and L4-5. Atherosclerotic calcifications.   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  PATIENT SURVEYS:  FOTO 51  SCREENING FOR RED FLAGS: Bowel or bladder incontinence: No Spinal tumors: No Cauda equina syndrome: No Compression fracture: No Abdominal aneurysm: No  COGNITION: Overall cognitive status: Within functional limits for tasks assessed     SENSATION: WFL  MUSCLE LENGTH: Hamstrings: BLE tightness  POSTURE: No Significant postural limitations  PALPATION: No remarkable findings  LUMBAR ROM:   AROM eval  Flexion 75% with pain  Extension WFL  Right lateral flexion WFL  Left lateral flexion WFL  Right rotation WFL  Left rotation WFL   (Blank rows = not tested)  LOWER EXTREMITY ROM:   grossly WFL   LOWER EXTREMITY MMT:  grossly 5/5   LUMBAR SPECIAL TESTS:  Straight leg raise test: Positive, Slump test: Negative, and FABER test: Positive  FUNCTIONAL TESTS:  5 times sit to stand: 12.82s with pain   TODAY'S TREATMENT:                                                                                                                              DATE:  07/10/22 Walk outdoors  Bike L3 x72mins  Calf stretch 30s Rows and Lats 25# 2x10 Ab roll ups 2x12 Deadbugs 2x10 STM to L glutes./piriformis with tennis ball and gun  Passive stretching lower extremities- HS,  piriformis, glutes, DKTC    07/05/22 Walk outdoors around back building  Leg ext 10# 2x10 HS curls 25# 2x10 Calf stretch 30s  Calf raises 2x12  blackTB flexion 2x10  NuStep L6 x19mins AR press 10# 2x10  Leg press 30# 2x10  STM to low back and glutes with gun    07/03/22 NuStep L5 x23mins  Passive stretching HS, Piriformis, ITB, Single K2C, Double K2C, lower trunk rotations, glutes laying on MH Feet on ball pall rotations  and knees to chest x10 Bridges 2x10 Clamshells green 2x10 STS with yellow ball OHP 2x10  Rows and lat pull downs 20# 2x10 Black TB ext 2x10   06/28/22 Bike L4 x 6 min   MHP to lumbar spine  Lower trunk rotations  LE on Pball bridges, oblq, K2C  Passive stretching HS, Piriformis, ITB, Single K2C, Double K2C, lower trunk rotations, glute STM to L glute and lumbar area with thera gun   06/26/22 Bike L5 x 6 min S2S OHP red ball 2x10 Rows & Lats 20lb 2x10 Bridges x10 LE on Pball bridges, oblq, K2C Passive stretching HS, Piriformis, TB, Single K2C, Double K2C, lower trunk rotaions  06/20/22- EVAL    PATIENT EDUCATION:  Education details: POC and HEP Person educated: Patient Education method: Explanation Education comprehension: verbalized understanding  HOME EXERCISE PROGRAM: Access Code: [email or web address] URL: [email or web address] Date: 06/20/2022 Prepared by: Cassie Freer  Exercises - Supine Lower Trunk Rotation  - 1 x daily - 7 x weekly - 2 sets - 10 reps - Supine Bridge  - 1 x daily - 7 x weekly - 2 sets - 10 reps - Supine Piriformis Stretch with Foot on Ground  - 1 x daily - 7 x weekly - 2 sets - 30 hold - Supine Figure 4 Piriformis Stretch  - 1 x daily - 7 x weekly - 2 sets - 30 hold - Supine Single Knee to Chest Stretch  - 1 x daily - 7 x weekly - 2 sets - 30 hold - Seated Hamstring Stretch  - 1 x daily - 7 x weekly - 2 sets - 30 hold  ASSESSMENT:  CLINICAL IMPRESSION: Patient returns her pain has moved down into her leg and is not in her back that much. She describes it as a deep aches, whereas before it used to be an electrical feeling. Since her pain centralizes to her glutes/piriformis this is where I focused on for manual therapy. Some tightness with STM, might try DN next visit to see if that provides any relief. Advised to try to put heat on it as we did some deep tissue work that might be sore later.    OBJECTIVE IMPAIRMENTS: decreased ROM and pain.    ACTIVITY LIMITATIONS: bending, squatting, and locomotion level  PARTICIPATION LIMITATIONS: community activity and yard work  [facility] POTENTIAL: Good  CLINICAL DECISION MAKING: Stable/uncomplicated  EVALUATION COMPLEXITY: Low   GOALS: Goals reviewed with patient? Yes  SHORT TERM GOALS: Target date: 07/18/22  Patient will be independent with initial HEP.  Goal status: Met 06/28/22  2.  Patient will report centralization of radicular symptoms.  Baseline: radiates down LLE, does not go past knee Goal status: MET 07/10/22  3. Patient will complete 5xSTS <12s without pain  Baseline: 12.82s with pain in LB Goal status: INITIAL  LONG TERM GOALS: Target date: 08/15/22  Patient will be independent with advanced/ongoing HEP to improve outcomes and carryover.  Goal status: INITIAL  2.  Patient will report 75% improvement in low back  pain to improve QOL.  Baseline: 4/10 Goal status: INITIAL  3.  Patient will demonstrate full pain free lumbar ROM to perform ADLs.   Baseline: see chart Goal status: INITIAL  4.  Patient will report 39 on lumbar FOTO to demonstrate improved functional ability.  Baseline: 51 Goal status: INITIAL   5.  Patient will be able to return to her normal exercises routine and gardening without pain Baseline: resistance training 3x/wk, yoga 2x a wk, walking every day Goal status: INITIAL   PLAN:  PT FREQUENCY: 2x/week  PT DURATION: 8 weeks  PLANNED INTERVENTIONS: Therapeutic exercises, Therapeutic activity, Neuromuscular re-education, Balance training, Gait training, Patient/Family education, Self Care, Joint mobilization, Dry Needling, Electrical stimulation, Spinal mobilization, Cryotherapy, Moist heat, Traction, Ultrasound, and Manual therapy.  PLAN FOR NEXT SESSION: stretching esp for hips,  gym activities for low back and core strengthening    Cassie Freer, PT 07/10/2022, 10:11 AM

## 2022-07-07 ENCOUNTER — Encounter: Payer: Self-pay | Admitting: Sports Medicine

## 2022-07-10 ENCOUNTER — Ambulatory Visit: Payer: Medicare Other

## 2022-07-10 DIAGNOSIS — M6281 Muscle weakness (generalized): Secondary | ICD-10-CM

## 2022-07-10 DIAGNOSIS — M5416 Radiculopathy, lumbar region: Secondary | ICD-10-CM | POA: Diagnosis not present

## 2022-07-10 DIAGNOSIS — M5459 Other low back pain: Secondary | ICD-10-CM

## 2022-07-10 DIAGNOSIS — M62830 Muscle spasm of back: Secondary | ICD-10-CM

## 2022-07-12 ENCOUNTER — Encounter: Payer: Self-pay | Admitting: Physical Therapy

## 2022-07-12 ENCOUNTER — Ambulatory Visit: Payer: Medicare Other | Admitting: Physical Therapy

## 2022-07-12 DIAGNOSIS — M6281 Muscle weakness (generalized): Secondary | ICD-10-CM

## 2022-07-12 DIAGNOSIS — M5416 Radiculopathy, lumbar region: Secondary | ICD-10-CM

## 2022-07-12 DIAGNOSIS — M5459 Other low back pain: Secondary | ICD-10-CM

## 2022-07-12 NOTE — Therapy (Signed)
[place] [facility] Outpatient Rehabilitation at [facility]. [place], [place], [address] Phone: [phone]   Fax:  [phone]  Patient Details  Name: Charlotte Brooks MRN: [id number] Date of Birth: 04-26-1945 Referring Provider:  Ralene Cork, DO  OUTPATIENT PHYSICAL THERAPY THORACOLUMBAR TREATMENT   Patient Name: Charlotte Brooks MRN: [id number] DOB:Oct 29, 1945, 77 y.o., female Today's Date: 07/12/2022  END OF SESSION:  PT End of Session - 07/12/22 0929     Visit Number 7    Date for PT Re-Evaluation 08/15/22    PT Start Time 0930    PT Stop Time 1015    PT Time Calculation (min) 45 min    Activity Tolerance Patient tolerated treatment well    Behavior During Therapy [facility] for tasks assessed/performed               Past Medical History:  Diagnosis Date   Arthritis    Constipation    Osteoporosis    Gets Reclast   Past Surgical History:  Procedure Laterality Date   COLONOSCOPY     HERNIA REPAIR  2/08   KNEE ARTHROPLASTY     lateral release    KNEE SURGERY     POLYPECTOMY     Patient Active Problem List   Diagnosis Date Noted   Asymptomatic PVD (peripheral vascular disease) (HCC) 04/06/2022   Dysfunction of both eustachian tubes 03/08/2022   Chronic constipation 10/01/2020   Cherry angioma 05/03/2020   Seborrheic keratoses 05/03/2020   Multiple benign nevi 05/03/2020   Atypical glandular cells of undetermined significance (AGUS) on cervical Pap smear 11/15/2017   DDD (degenerative disc disease), lumbar 06/28/2017   Chronic SI joint pain 06/28/2017   Primary osteoarthritis of both hands 06/28/2017   Primary osteoarthritis of both knees 06/28/2017   Senile osteoporosis 05/18/2009   Persistent disorder of initiating or maintaining sleep 05/31/2006    PCP: Alysia Penna  REFERRING PROVIDER: Reino Bellis  REFERRING DIAG:  M54.50 (ICD-10-CM) - Acute left-sided low back pain, unspecified whether sciatica present     Rationale for Evaluation and Treatment: Rehabilitation  THERAPY DIAG:  Other low back pain  Muscle weakness (generalized)  Radiculopathy, lumbar region  ONSET DATE: 06/06/22  SUBJECTIVE:                                                                                                                                                                                           SUBJECTIVE STATEMENT: Pain is still across my back and down my leg, but not as bad as it was   PERTINENT HISTORY:  Patient presents today for follow-up on acute low  back pain.  She is feeling much better.  Prednisone was helpful but she did experience some jitteriness and insomnia with it.  She only took a couple of doses of the tramadol.  X-rays of her lumbar spine showed multilevel degenerative changes with the most pronounced change at L3-L4.   She takes meloxicam 7.5 mg for other aches and pains.  I advised her to start with 15 mg of meloxicam in the future if she has a return of severe low back pain.     PAIN:  Are you having pain? Yes: NPRS scale: 4/10 Pain location: low back, L buttock and radiates down leg  Pain description: grabs me and almost takes my breath, it comes and goes, worse in the AM Aggravating factors: not anything really  Relieving factors: eases up the more I move around  PRECAUTIONS: None  WEIGHT BEARING RESTRICTIONS: No  FALLS:  Has patient fallen in last 6 months? No  LIVING ENVIRONMENT: Lives with: lives with their spouse Lives in: House/apartment   OCCUPATION: Retired  PLOF: Independent  PATIENT GOALS: to get back to my life as it was working out and walking and gardening   NEXT MD VISIT: 08/15/22  OBJECTIVE:   DIAGNOSTIC FINDINGS:  There are five non-rib bearing lumbar-type vertebral bodies with riblets at T12. There is dextrocurvature of the lumbar spine with multilevel asymmetric disc space height loss. There is straightening of the lumbar lordosis with trace  anterolisthesis of L1-2, L4-5 and L5-S1 and mild retrolisthesis of L2-3. There is no evidence for acute fracture or subluxation. Severe intervertebral disc space height loss with reactive endplate sclerosis and subcortical cystic change at L3-4. Moderate to severe intervertebral disc space height loss at L2-3 and L4-5. Atherosclerotic calcifications.   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  PATIENT SURVEYS:  FOTO 51  SCREENING FOR RED FLAGS: Bowel or bladder incontinence: No Spinal tumors: No Cauda equina syndrome: No Compression fracture: No Abdominal aneurysm: No  COGNITION: Overall cognitive status: Within functional limits for tasks assessed     SENSATION: WFL  MUSCLE LENGTH: Hamstrings: BLE tightness  POSTURE: No Significant postural limitations  PALPATION: No remarkable findings  LUMBAR ROM:   AROM eval  Flexion 75% with pain  Extension WFL  Right lateral flexion WFL  Left lateral flexion WFL  Right rotation WFL  Left rotation WFL   (Blank rows = not tested)  LOWER EXTREMITY ROM:   grossly WFL   LOWER EXTREMITY MMT:  grossly 5/5   LUMBAR SPECIAL TESTS:  Straight leg raise test: Positive, Slump test: Negative, and FABER test: Positive  FUNCTIONAL TESTS:  5 times sit to stand: 12.82s with pain   TODAY'S TREATMENT:                                                                                                                              DATE:  07/12/22 Walked outdoors Black t-band flex/ext 2x10 Rows  and lat pull downs 25lbs 2x10 Squats with ohp and rotation with yellow ball 2x10 Supine bridges with orange ball 1x10; caused 5/10 pain so dropped the ball and did 10 with knees bent Supine B knee side to sides 2x10 Supine PROM to hips and B LEs    07/10/22 Walk outdoors  Bike L3 x50mins  Calf stretch 30s Rows and Lats 25# 2x10 Ab roll ups 2x12 Deadbugs 2x10 STM to L glutes./piriformis with tennis ball  and gun  Passive stretching lower extremities- HS, piriformis, glutes, DKTC    07/05/22 Walk outdoors around back building  Leg ext 10# 2x10 HS curls 25# 2x10 Calf stretch 30s  Calf raises 2x12  blackTB flexion 2x10  NuStep L6 x48mins AR press 10# 2x10  Leg press 30# 2x10  STM to low back and glutes with gun    07/03/22 NuStep L5 x50mins  Passive stretching HS, Piriformis, ITB, Single K2C, Double K2C, lower trunk rotations, glutes laying on MH Feet on ball pall rotations and knees to chest x10 Bridges 2x10 Clamshells green 2x10 STS with yellow ball OHP 2x10  Rows and lat pull downs 20# 2x10 Black TB ext 2x10   06/28/22 Bike L4 x 6 min   MHP to lumbar spine  Lower trunk rotations  LE on Pball bridges, oblq, K2C  Passive stretching HS, Piriformis, ITB, Single K2C, Double K2C, lower trunk rotations, glute STM to L glute and lumbar area with thera gun   06/26/22 Bike L5 x 6 min S2S OHP red ball 2x10 Rows & Lats 20lb 2x10 Bridges x10 LE on Pball bridges, oblq, K2C Passive stretching HS, Piriformis, TB, Single K2C, Double K2C, lower trunk rotaions  06/20/22- EVAL    PATIENT EDUCATION:  Education details: POC and HEP Person educated: Patient Education method: Explanation Education comprehension: verbalized understanding  HOME EXERCISE PROGRAM: Access Code: [email or web address] URL: [email or web address] Date: 06/20/2022 Prepared by: Cassie Freer  Exercises - Supine Lower Trunk Rotation  - 1 x daily - 7 x weekly - 2 sets - 10 reps - Supine Bridge  - 1 x daily - 7 x weekly - 2 sets - 10 reps - Supine Piriformis Stretch with Foot on Ground  - 1 x daily - 7 x weekly - 2 sets - 30 hold - Supine Figure 4 Piriformis Stretch  - 1 x daily - 7 x weekly - 2 sets - 30 hold - Supine Single Knee to Chest Stretch  - 1 x daily - 7 x weekly - 2 sets - 30 hold - Seated Hamstring Stretch  - 1 x daily - 7 x weekly - 2 sets - 30 hold  ASSESSMENT:  CLINICAL IMPRESSION: Patient  reported feeling the same pain but not as bad as it has been. She tolerated treatment well and is making progress toward all goals. Minimal Vcs needed for correct form during rows.    OBJECTIVE IMPAIRMENTS: decreased ROM and pain.   ACTIVITY LIMITATIONS: bending, squatting, and locomotion level  PARTICIPATION LIMITATIONS: community activity and yard work  [facility] POTENTIAL: Good  CLINICAL DECISION MAKING: Stable/uncomplicated  EVALUATION COMPLEXITY: Low   GOALS: Goals reviewed with patient? Yes  SHORT TERM GOALS: Target date: 07/18/22  Patient will be independent with initial HEP.  Goal status: Met 06/28/22  2.  Patient will report centralization of radicular symptoms.  Baseline: radiates down LLE, does not go past knee Goal status: MET 07/10/22  3. Patient will complete 5xSTS <12s without pain  Baseline: 12.82s with pain in LB Goal status: INITIAL  LONG TERM GOALS: Target date: 08/15/22  Patient will be independent with advanced/ongoing HEP to improve outcomes and carryover.  Goal status: INITIAL  2.  Patient will report 75% improvement in low back pain to improve QOL.  Baseline: 4/10 Goal status: INITIAL  3.  Patient will demonstrate full pain free lumbar ROM to perform ADLs.   Baseline: see chart Goal status: INITIAL  4.  Patient will report 33 on lumbar FOTO to demonstrate improved functional ability.  Baseline: 51 Goal status: INITIAL   5.  Patient will be able to return to her normal exercises routine and gardening without pain Baseline: resistance training 3x/wk, yoga 2x a wk, walking every day Goal status: INITIAL   PLAN:  PT FREQUENCY: 2x/week  PT DURATION: 8 weeks  PLANNED INTERVENTIONS: Therapeutic exercises, Therapeutic activity, Neuromuscular re-education, Balance training, Gait training, Patient/Family education, Self Care, Joint mobilization, Dry Needling, Electrical stimulation, Spinal mobilization, Cryotherapy, Moist heat, Traction, Ultrasound,  and Manual therapy.  PLAN FOR NEXT SESSION: continue with back and core strengthening   Rush Landmark 07/12/2022, 9:37 AM  Encounter Date: 07/12/2022

## 2022-07-14 NOTE — Therapy (Signed)
[place] [facility] Health Outpatient Rehabilitation at [facility]. [place], [place], [address] Phone: [phone]   Fax:  [phone]  Patient Details  Name: Charlotte Brooks MRN: [id number] Date of Birth: Aug 07, 1945 Referring Provider:  Ralene Cork, DO  OUTPATIENT PHYSICAL THERAPY THORACOLUMBAR TREATMENT   Patient Name: Charlotte Brooks MRN: [id number] DOB:[birth year], 77 y.o., female Today's Date: 07/17/2022  END OF SESSION:  PT End of Session - 07/17/22 0843     Visit Number 8    Date for PT Re-Evaluation 08/15/22    PT Start Time 0845    PT Stop Time 0930    PT Time Calculation (min) 45 min    Activity Tolerance Patient tolerated treatment well    Behavior During Therapy [facility] for tasks assessed/performed                Past Medical History:  Diagnosis Date   Arthritis    Constipation    Osteoporosis    Gets Reclast   Past Surgical History:  Procedure Laterality Date   COLONOSCOPY     HERNIA REPAIR  2/08   KNEE ARTHROPLASTY     lateral release    KNEE SURGERY     POLYPECTOMY     Patient Active Problem List   Diagnosis Date Noted   Asymptomatic PVD (peripheral vascular disease) (HCC) 04/06/2022   Dysfunction of both eustachian tubes 03/08/2022   Chronic constipation 10/01/2020   Cherry angioma 05/03/2020   Seborrheic keratoses 05/03/2020   Multiple benign nevi 05/03/2020   Atypical glandular cells of undetermined significance (AGUS) on cervical Pap smear 11/15/2017   DDD (degenerative disc disease), lumbar 06/28/2017   Chronic SI joint pain 06/28/2017   Primary osteoarthritis of both hands 06/28/2017   Primary osteoarthritis of both knees 06/28/2017   Senile osteoporosis 05/18/2009   Persistent disorder of initiating or maintaining sleep 05/31/2006    PCP: Alysia Penna  REFERRING PROVIDER: Reino Bellis  REFERRING DIAG:  M54.50 (ICD-10-CM) - Acute left-sided low back pain, unspecified whether sciatica present     Rationale for Evaluation and Treatment: Rehabilitation  THERAPY DIAG:  Other low back pain  Muscle weakness (generalized)  Radiculopathy, lumbar region  Muscle spasm of back  ONSET DATE: 06/06/22  SUBJECTIVE:                                                                                                                                                                                           SUBJECTIVE STATEMENT: It is hurting a little more today than usual. MRI scheduled for next week.   PERTINENT HISTORY:  Patient presents today for follow-up  on acute low back pain.  She is feeling much better.  Prednisone was helpful but she did experience some jitteriness and insomnia with it.  She only took a couple of doses of the tramadol.  X-rays of her lumbar spine showed multilevel degenerative changes with the most pronounced change at L3-L4.   She takes meloxicam 7.5 mg for other aches and pains.  I advised her to start with 15 mg of meloxicam in the future if she has a return of severe low back pain.     PAIN:  Are you having pain? Yes: NPRS scale: 4/10 Pain location: low back, L buttock and radiates down leg  Pain description: grabs me and almost takes my breath, it comes and goes, worse in the AM Aggravating factors: not anything really  Relieving factors: eases up the more I move around  PRECAUTIONS: None  WEIGHT BEARING RESTRICTIONS: No  FALLS:  Has patient fallen in last 6 months? No  LIVING ENVIRONMENT: Lives with: lives with their spouse Lives in: House/apartment   OCCUPATION: Retired  PLOF: Independent  PATIENT GOALS: to get back to my life as it was working out and walking and gardening   NEXT MD VISIT: 08/15/22  OBJECTIVE:   DIAGNOSTIC FINDINGS:  There are five non-rib bearing lumbar-type vertebral bodies with riblets at T12. There is dextrocurvature of the lumbar spine with multilevel asymmetric disc space height loss. There is straightening of the  lumbar lordosis with trace anterolisthesis of L1-2, L4-5 and L5-S1 and mild retrolisthesis of L2-3. There is no evidence for acute fracture or subluxation. Severe intervertebral disc space height loss with reactive endplate sclerosis and subcortical cystic change at L3-4. Moderate to severe intervertebral disc space height loss at L2-3 and L4-5. Atherosclerotic calcifications.   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  PATIENT SURVEYS:  FOTO 51  SCREENING FOR RED FLAGS: Bowel or bladder incontinence: No Spinal tumors: No Cauda equina syndrome: No Compression fracture: No Abdominal aneurysm: No  COGNITION: Overall cognitive status: Within functional limits for tasks assessed     SENSATION: WFL  MUSCLE LENGTH: Hamstrings: BLE tightness  POSTURE: No Significant postural limitations  PALPATION: No remarkable findings  LUMBAR ROM:   AROM eval  Flexion 75% with pain  Extension WFL  Right lateral flexion WFL  Left lateral flexion WFL  Right rotation WFL  Left rotation WFL   (Blank rows = not tested)  LOWER EXTREMITY ROM:   grossly WFL   LOWER EXTREMITY MMT:  grossly 5/5   LUMBAR SPECIAL TESTS:  Straight leg raise test: Positive, Slump test: Negative, and FABER test: Positive  FUNCTIONAL TESTS:  5 times sit to stand: 12.82s with pain   TODAY'S TREATMENT:                                                                                                                              DATE:  07/17/22 NuStep L5 x32mins  Leg press 20# 2x10  SL bridges x10 SLR 2# then with abd 2x10  Deep tissue massage to L glute and piriformis STS with chest press yellow 2x10 Shoulder ext 10# 2x10 AR press 10# 2x10 Calf stretch on slant 30s    07/12/22 Walked outdoors Black t-band flex/ext 2x10 Rows and lat pull downs 25lbs 2x10 Squats with ohp and rotation with yellow ball 2x10 Supine bridges with orange ball 1x10; caused 5/10 pain so  dropped the ball and did 10 with knees bent Supine B knee side to sides 2x10 Supine PROM to hips and B LEs    07/10/22 Walk outdoors  Bike L3 x11mins  Calf stretch 30s Rows and Lats 25# 2x10 Ab roll ups 2x12 Deadbugs 2x10 STM to L glutes./piriformis with tennis ball and gun  Passive stretching lower extremities- HS, piriformis, glutes, DKTC    07/05/22 Walk outdoors around back building  Leg ext 10# 2x10 HS curls 25# 2x10 Calf stretch 30s  Calf raises 2x12  blackTB flexion 2x10  NuStep L6 x25mins AR press 10# 2x10  Leg press 30# 2x10  STM to low back and glutes with gun    07/03/22 NuStep L5 x69mins  Passive stretching HS, Piriformis, ITB, Single K2C, Double K2C, lower trunk rotations, glutes laying on MH Feet on ball pall rotations and knees to chest x10 Bridges 2x10 Clamshells green 2x10 STS with yellow ball OHP 2x10  Rows and lat pull downs 20# 2x10 Black TB ext 2x10   06/28/22 Bike L4 x 6 min   MHP to lumbar spine  Lower trunk rotations  Charlotte on Pball bridges, oblq, K2C  Passive stretching HS, Piriformis, ITB, Single K2C, Double K2C, lower trunk rotations, glute STM to L glute and lumbar area with thera gun   06/26/22 Bike L5 x 6 min S2S OHP red ball 2x10 Rows & Lats 20lb 2x10 Bridges x10 Charlotte on Pball bridges, oblq, K2C Passive stretching HS, Piriformis, TB, Single K2C, Double K2C, lower trunk rotaions  06/20/22- EVAL    PATIENT EDUCATION:  Education details: POC and HEP Person educated: Patient Education method: Explanation Education comprehension: verbalized understanding  HOME EXERCISE PROGRAM: Access Code: [email or web address] URL: [email or web address] Date: 06/20/2022 Prepared by: Cassie Freer  Exercises - Supine Lower Trunk Rotation  - 1 x daily - 7 x weekly - 2 sets - 10 reps - Supine Bridge  - 1 x daily - 7 x weekly - 2 sets - 10 reps - Supine Piriformis Stretch with Foot on Ground  - 1 x daily - 7 x weekly - 2 sets - 30 hold - Supine  Figure 4 Piriformis Stretch  - 1 x daily - 7 x weekly - 2 sets - 30 hold - Supine Single Knee to Chest Stretch  - 1 x daily - 7 x weekly - 2 sets - 30 hold - Seated Hamstring Stretch  - 1 x daily - 7 x weekly - 2 sets - 30 hold  ASSESSMENT:  CLINICAL IMPRESSION: Patient reported feeling the same pain in L hip and glute, reports it is worse today. Cramping in hamstrings with SL bridges. Still has some tightness and tenderness in L glute and piriformis. Would like to try DN on her, will hold until provider returns from vacation next week.   OBJECTIVE IMPAIRMENTS: decreased ROM and pain.   ACTIVITY LIMITATIONS: bending, squatting, and locomotion level  PARTICIPATION LIMITATIONS: community activity and yard work  [facility] POTENTIAL: Good  CLINICAL DECISION MAKING: Stable/uncomplicated  EVALUATION COMPLEXITY: Low   GOALS:  Goals reviewed with patient? Yes  SHORT TERM GOALS: Target date: 07/18/22  Patient will be independent with initial HEP.  Goal status: Met 06/28/22  2.  Patient will report centralization of radicular symptoms.  Baseline: radiates down LLE, does not go past knee Goal status: MET 07/10/22  3. Patient will complete 5xSTS <12s without pain  Baseline: 12.82s with pain in LB Goal status: INITIAL  LONG TERM GOALS: Target date: 08/15/22  Patient will be independent with advanced/ongoing HEP to improve outcomes and carryover.  Goal status: INITIAL  2.  Patient will report 75% improvement in low back pain to improve QOL.  Baseline: 4/10 Goal status: INITIAL  3.  Patient will demonstrate full pain free lumbar ROM to perform ADLs.   Baseline: see chart Goal status: INITIAL  4.  Patient will report 35 on lumbar FOTO to demonstrate improved functional ability.  Baseline: 51 Goal status: INITIAL   5.  Patient will be able to return to her normal exercises routine and gardening without pain Baseline: resistance training 3x/wk, yoga 2x a wk, walking every day Goal  status: INITIAL   PLAN:  PT FREQUENCY: 2x/week  PT DURATION: 8 weeks  PLANNED INTERVENTIONS: Therapeutic exercises, Therapeutic activity, Neuromuscular re-education, Balance training, Gait training, Patient/Family education, Self Care, Joint mobilization, Dry Needling, Electrical stimulation, Spinal mobilization, Cryotherapy, Moist heat, Traction, Ultrasound, and Manual therapy.  PLAN FOR NEXT SESSION: continue with back and core strengthening, hip ext    Cassie Freer, PT,DPT 07/17/2022, 9:28 AM  Encounter Date: 07/17/2022

## 2022-07-17 ENCOUNTER — Ambulatory Visit: Payer: Medicare Other | Attending: Sports Medicine

## 2022-07-17 DIAGNOSIS — M62830 Muscle spasm of back: Secondary | ICD-10-CM | POA: Diagnosis present

## 2022-07-17 DIAGNOSIS — M5459 Other low back pain: Secondary | ICD-10-CM | POA: Insufficient documentation

## 2022-07-17 DIAGNOSIS — M6281 Muscle weakness (generalized): Secondary | ICD-10-CM | POA: Insufficient documentation

## 2022-07-17 DIAGNOSIS — M5416 Radiculopathy, lumbar region: Secondary | ICD-10-CM | POA: Insufficient documentation

## 2022-07-18 ENCOUNTER — Other Ambulatory Visit: Payer: Self-pay | Admitting: *Deleted

## 2022-07-18 MED ORDER — DIAZEPAM 5 MG PO TABS: ORAL_TABLET | ORAL | 0 refills | Status: DC

## 2022-07-18 NOTE — Progress Notes (Signed)
[facility] called in for MRI

## 2022-07-18 NOTE — Therapy (Signed)
[facility] at [facility] [place], [place], [address] Phone: [phone]   Fax:  [phone]  Patient Details  Name: Charlotte Brooks MRN: [id number] Date of Birth: 19-Jun-1945 Referring Provider:  Ralene Cork, DO  OUTPATIENT PHYSICAL THERAPY THORACOLUMBAR TREATMENT   Patient Name: Charlotte Brooks MRN: [id number] DOB:1945/07/22, 77 y.o., female Today's Date: 07/19/2022  END OF SESSION:  PT End of Session - 07/19/22 0841     Visit Number 9    Date for PT Re-Evaluation 08/15/22    PT Start Time 0841    PT Stop Time 0925    PT Time Calculation (min) 44 min    Activity Tolerance Patient tolerated treatment well    Behavior During Therapy [facility] for tasks assessed/performed                Past Medical History:  Diagnosis Date   Arthritis    Constipation    Osteoporosis    Gets Reclast   Past Surgical History:  Procedure Laterality Date   COLONOSCOPY     HERNIA REPAIR  2/08   KNEE ARTHROPLASTY     lateral release    KNEE SURGERY     POLYPECTOMY     Patient Active Problem List   Diagnosis Date Noted   Asymptomatic PVD (peripheral vascular disease) (HCC) 04/06/2022   Dysfunction of both eustachian tubes 03/08/2022   Chronic constipation 10/01/2020   Cherry angioma 05/03/2020   Seborrheic keratoses 05/03/2020   Multiple benign nevi 05/03/2020   Atypical glandular cells of undetermined significance (AGUS) on cervical Pap smear 11/15/2017   DDD (degenerative disc disease), lumbar 06/28/2017   Chronic SI joint pain 06/28/2017   Primary osteoarthritis of both hands 06/28/2017   Primary osteoarthritis of both knees 06/28/2017   Senile osteoporosis 05/18/2009   Persistent disorder of initiating or maintaining sleep 05/31/2006    PCP: Alysia Penna  REFERRING PROVIDER: Reino Bellis  REFERRING DIAG:  M54.50 (ICD-10-CM) - Acute left-sided low back pain, unspecified whether sciatica present     Rationale for Evaluation and Treatment: Rehabilitation  THERAPY DIAG:  Other low back pain  Muscle weakness (generalized)  Radiculopathy, lumbar region  Muscle spasm of back  ONSET DATE: 06/06/22  SUBJECTIVE:                                                                                                                                                                                           SUBJECTIVE STATEMENT: I am okay but still have pain in my butt.   PERTINENT HISTORY:  Patient presents today for follow-up on acute low back  pain.  She is feeling much better.  Prednisone was helpful but she did experience some jitteriness and insomnia with it.  She only took a couple of doses of the tramadol.  X-rays of her lumbar spine showed multilevel degenerative changes with the most pronounced change at L3-L4.   She takes meloxicam 7.5 mg for other aches and pains.  I advised her to start with 15 mg of meloxicam in the future if she has a return of severe low back pain.     PAIN:  Are you having pain? Yes: NPRS scale: 4/10 Pain location: low back, L buttock and radiates down leg  Pain description: grabs me and almost takes my breath, it comes and goes, worse in the AM Aggravating factors: not anything really  Relieving factors: eases up the more I move around  PRECAUTIONS: None  WEIGHT BEARING RESTRICTIONS: No  FALLS:  Has patient fallen in last 6 months? No  LIVING ENVIRONMENT: Lives with: lives with their spouse Lives in: House/apartment   OCCUPATION: Retired  PLOF: Independent  PATIENT GOALS: to get back to my life as it was working out and walking and gardening   NEXT MD VISIT: 08/15/22  OBJECTIVE:   DIAGNOSTIC FINDINGS:  There are five non-rib bearing lumbar-type vertebral bodies with riblets at T12. There is dextrocurvature of the lumbar spine with multilevel asymmetric disc space height loss. There is straightening of the lumbar lordosis with trace  anterolisthesis of L1-2, L4-5 and L5-S1 and mild retrolisthesis of L2-3. There is no evidence for acute fracture or subluxation. Severe intervertebral disc space height loss with reactive endplate sclerosis and subcortical cystic change at L3-4. Moderate to severe intervertebral disc space height loss at L2-3 and L4-5. Atherosclerotic calcifications.   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  PATIENT SURVEYS:  FOTO 51  SCREENING FOR RED FLAGS: Bowel or bladder incontinence: No Spinal tumors: No Cauda equina syndrome: No Compression fracture: No Abdominal aneurysm: No  COGNITION: Overall cognitive status: Within functional limits for tasks assessed     SENSATION: WFL  MUSCLE LENGTH: Hamstrings: BLE tightness  POSTURE: No Significant postural limitations  PALPATION: No remarkable findings  LUMBAR ROM:   AROM eval  Flexion 75% with pain  Extension WFL  Right lateral flexion WFL  Left lateral flexion WFL  Right rotation WFL  Left rotation WFL   (Blank rows = not tested)  LOWER EXTREMITY ROM:   grossly WFL   LOWER EXTREMITY MMT:  grossly 5/5   LUMBAR SPECIAL TESTS:  Straight leg raise test: Positive, Slump test: Negative, and FABER test: Positive  FUNCTIONAL TESTS:  5 times sit to stand: 12.82s with pain   TODAY'S TREATMENT:                                                                                                                              DATE:  07/19/22 Bike L3 x68mins  Resisted gait 40# 4  way x4  Hip abd/ext 5# 2x10  Step ups 6" Calf stretch 30s  Leg ext 10# 2x10  HS curls 25# 2x10  Feet on pball rotations, knees to chest, small bridges x10 Deep tissue massage to L glute and piriformis   07/17/22 NuStep L5 x44mins  Leg press 20# 2x10  SL bridges x10 SLR 2# then with abd 2x10  Deep tissue massage to L glute and piriformis STS with chest press yellow 2x10 Shoulder ext 10# 2x10 AR press 10#  2x10 Calf stretch on slant 30s    07/12/22 Walked outdoors Black t-band flex/ext 2x10 Rows and lat pull downs 25lbs 2x10 Squats with ohp and rotation with yellow ball 2x10 Supine bridges with orange ball 1x10; caused 5/10 pain so dropped the ball and did 10 with knees bent Supine B knee side to sides 2x10 Supine PROM to hips and B LEs    07/10/22 Walk outdoors  Bike L3 x38mins  Calf stretch 30s Rows and Lats 25# 2x10 Ab roll ups 2x12 Deadbugs 2x10 STM to L glutes./piriformis with tennis ball and gun  Passive stretching lower extremities- HS, piriformis, glutes, DKTC    07/05/22 Walk outdoors around back building  Leg ext 10# 2x10 HS curls 25# 2x10 Calf stretch 30s  Calf raises 2x12  blackTB flexion 2x10  NuStep L6 x58mins AR press 10# 2x10  Leg press 30# 2x10  STM to low back and glutes with gun    07/03/22 NuStep L5 x29mins  Passive stretching HS, Piriformis, ITB, Single K2C, Double K2C, lower trunk rotations, glutes laying on MH Feet on ball pall rotations and knees to chest x10 Bridges 2x10 Clamshells green 2x10 STS with yellow ball OHP 2x10  Rows and lat pull downs 20# 2x10 Black TB ext 2x10   06/28/22 Bike L4 x 6 min   MHP to lumbar spine  Lower trunk rotations  LE on Pball bridges, oblq, K2C  Passive stretching HS, Piriformis, ITB, Single K2C, Double K2C, lower trunk rotations, glute STM to L glute and lumbar area with thera gun   06/26/22 Bike L5 x 6 min S2S OHP red ball 2x10 Rows & Lats 20lb 2x10 Bridges x10 LE on Pball bridges, oblq, K2C Passive stretching HS, Piriformis, TB, Single K2C, Double K2C, lower trunk rotaions  06/20/22- EVAL    PATIENT EDUCATION:  Education details: POC and HEP Person educated: Patient Education method: Explanation Education comprehension: verbalized understanding  HOME EXERCISE PROGRAM: Access Code: [email or web address] URL: [email or web address] Date: 06/20/2022 Prepared by: Cassie Freer  Exercises - Supine  Lower Trunk Rotation  - 1 x daily - 7 x weekly - 2 sets - 10 reps - Supine Bridge  - 1 x daily - 7 x weekly - 2 sets - 10 reps - Supine Piriformis Stretch with Foot on Ground  - 1 x daily - 7 x weekly - 2 sets - 30 hold - Supine Figure 4 Piriformis Stretch  - 1 x daily - 7 x weekly - 2 sets - 30 hold - Supine Single Knee to Chest Stretch  - 1 x daily - 7 x weekly - 2 sets - 30 hold - Seated Hamstring Stretch  - 1 x daily - 7 x weekly - 2 sets - 30 hold  ASSESSMENT:  CLINICAL IMPRESSION: Patient reported feeling the same pain in L hip and glute. Focused on mostly hip stretching and strengthening today. Still has some tightness and tenderness in L glute and piriformis. Would like to try DN on her next visit to  see if this may provide her some relief.   OBJECTIVE IMPAIRMENTS: decreased ROM and pain.   ACTIVITY LIMITATIONS: bending, squatting, and locomotion level  PARTICIPATION LIMITATIONS: community activity and yard work  [facility] POTENTIAL: Good  CLINICAL DECISION MAKING: Stable/uncomplicated  EVALUATION COMPLEXITY: Low   GOALS: Goals reviewed with patient? Yes  SHORT TERM GOALS: Target date: 07/18/22  Patient will be independent with initial HEP.  Goal status: Met 06/28/22  2.  Patient will report centralization of radicular symptoms.  Baseline: radiates down LLE, does not go past knee Goal status: MET 07/10/22  3. Patient will complete 5xSTS <12s without pain  Baseline: 12.82s with pain in LB Goal status: INITIAL  LONG TERM GOALS: Target date: 08/15/22  Patient will be independent with advanced/ongoing HEP to improve outcomes and carryover.  Goal status: INITIAL  2.  Patient will report 75% improvement in low back and hip pain to improve QOL.  Baseline: 4/10 Goal status: INITIAL  3.  Patient will demonstrate full pain free lumbar ROM to perform ADLs.   Baseline: see chart Goal status: INITIAL  4.  Patient will report 52 on lumbar FOTO to demonstrate improved functional  ability.  Baseline: 51 Goal status: INITIAL   5.  Patient will be able to return to her normal exercises routine and gardening without pain Baseline: resistance training 3x/wk, yoga 2x a wk, walking every day Goal status: INITIAL   PLAN:  PT FREQUENCY: 2x/week  PT DURATION: 8 weeks  PLANNED INTERVENTIONS: Therapeutic exercises, Therapeutic activity, Neuromuscular re-education, Balance training, Gait training, Patient/Family education, Self Care, Joint mobilization, Dry Needling, Electrical stimulation, Spinal mobilization, Cryotherapy, Moist heat, Traction, Ultrasound, and Manual therapy.  PLAN FOR NEXT SESSION: progress note (recheck all goals), dry needling to L glute/piriformis    Cassie Freer, PT,DPT 07/19/2022, 9:22 AM  Encounter Date: 07/19/2022

## 2022-07-19 ENCOUNTER — Ambulatory Visit: Payer: Medicare Other

## 2022-07-19 DIAGNOSIS — M5416 Radiculopathy, lumbar region: Secondary | ICD-10-CM

## 2022-07-19 DIAGNOSIS — M5459 Other low back pain: Secondary | ICD-10-CM | POA: Diagnosis not present

## 2022-07-19 DIAGNOSIS — M6281 Muscle weakness (generalized): Secondary | ICD-10-CM

## 2022-07-19 DIAGNOSIS — M62830 Muscle spasm of back: Secondary | ICD-10-CM

## 2022-07-24 ENCOUNTER — Ambulatory Visit: Payer: Medicare Other | Admitting: Physical Therapy

## 2022-07-24 DIAGNOSIS — M5459 Other low back pain: Secondary | ICD-10-CM | POA: Diagnosis not present

## 2022-07-24 DIAGNOSIS — M5416 Radiculopathy, lumbar region: Secondary | ICD-10-CM

## 2022-07-24 DIAGNOSIS — M6281 Muscle weakness (generalized): Secondary | ICD-10-CM

## 2022-07-24 DIAGNOSIS — M62830 Muscle spasm of back: Secondary | ICD-10-CM

## 2022-07-24 NOTE — Therapy (Addendum)
[facility] at [facility]. [place], [place], [address] Phone: [phone]   Fax:  [phone]  Patient Details  Name: Charlotte Brooks MRN: [id number] Date of Birth: September 23, 1945 Referring Provider:  Ralene Cork, DO  OUTPATIENT PHYSICAL THERAPY THORACOLUMBAR TREATMENT Progress Note Reporting Period 06/20/22 to 07/24/22  See note below for Objective Data and Assessment of Progress/Goals.      Patient Name: Charlotte Brooks MRN: [id number] DOB:Aug 27, 1945, 77 y.o., female Today's Date: 07/24/2022  END OF SESSION:  PT End of Session - 07/24/22 0837     Visit Number 10    Date for PT Re-Evaluation 08/15/22    PT Start Time 0840    PT Stop Time 0925    PT Time Calculation (min) 45 min    Activity Tolerance Patient tolerated treatment well    Behavior During Therapy [facility] for tasks assessed/performed                Past Medical History:  Diagnosis Date   Arthritis    Constipation    Osteoporosis    Gets Reclast   Past Surgical History:  Procedure Laterality Date   COLONOSCOPY     HERNIA REPAIR  2/08   KNEE ARTHROPLASTY     lateral release    KNEE SURGERY     POLYPECTOMY     Patient Active Problem List   Diagnosis Date Noted   Asymptomatic PVD (peripheral vascular disease) (HCC) 04/06/2022   Dysfunction of both eustachian tubes 03/08/2022   Chronic constipation 10/01/2020   Cherry angioma 05/03/2020   Seborrheic keratoses 05/03/2020   Multiple benign nevi 05/03/2020   Atypical glandular cells of undetermined significance (AGUS) on cervical Pap smear 11/15/2017   DDD (degenerative disc disease), lumbar 06/28/2017   Chronic SI joint pain 06/28/2017   Primary osteoarthritis of both hands 06/28/2017   Primary osteoarthritis of both knees 06/28/2017   Senile osteoporosis 05/18/2009   Persistent disorder of initiating or maintaining sleep 05/31/2006    PCP: Alysia Penna  REFERRING PROVIDER: Reino Bellis  REFERRING DIAG:  M54.50 (ICD-10-CM) - Acute left-sided low back pain, unspecified whether sciatica present    Rationale for Evaluation and Treatment: Rehabilitation  THERAPY DIAG:  Other low back pain  Muscle weakness (generalized)  Radiculopathy, lumbar region  Muscle spasm of back  ONSET DATE: 06/06/22  SUBJECTIVE:                                                                                                                                                                                           SUBJECTIVE STATEMENT: Doing okay  PERTINENT HISTORY:  Patient presents today for follow-up on acute low back pain.  She is feeling much better.  Prednisone was helpful but she did experience some jitteriness and insomnia with it.  She only took a couple of doses of the tramadol.  X-rays of her lumbar spine showed multilevel degenerative changes with the most pronounced change at L3-L4.   She takes meloxicam 7.5 mg for other aches and pains.  I advised her to start with 15 mg of meloxicam in the future if she has a return of severe low back pain.     PAIN:  Are you having pain? Yes: NPRS scale: 5/10 Pain location: low back, L buttock and radiates down leg  Pain description: grabs me and almost takes my breath, it comes and goes, worse in the AM Aggravating factors: not anything really  Relieving factors: eases up the more I move around  PRECAUTIONS: None  WEIGHT BEARING RESTRICTIONS: No  FALLS:  Has patient fallen in last 6 months? No  LIVING ENVIRONMENT: Lives with: lives with their spouse Lives in: House/apartment   OCCUPATION: Retired  PLOF: Independent  PATIENT GOALS: to get back to my life as it was working out and walking and gardening   NEXT MD VISIT: 08/15/22  OBJECTIVE:   DIAGNOSTIC FINDINGS:  There are five non-rib bearing lumbar-type vertebral bodies with riblets at T12. There is dextrocurvature of the lumbar spine with multilevel asymmetric disc  space height loss. There is straightening of the lumbar lordosis with trace anterolisthesis of L1-2, L4-5 and L5-S1 and mild retrolisthesis of L2-3. There is no evidence for acute fracture or subluxation. Severe intervertebral disc space height loss with reactive endplate sclerosis and subcortical cystic change at L3-4. Moderate to severe intervertebral disc space height loss at L2-3 and L4-5. Atherosclerotic calcifications.   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  PATIENT SURVEYS:  FOTO 51  SCREENING FOR RED FLAGS: Bowel or bladder incontinence: No Spinal tumors: No Cauda equina syndrome: No Compression fracture: No Abdominal aneurysm: No  COGNITION: Overall cognitive status: Within functional limits for tasks assessed     SENSATION: WFL  MUSCLE LENGTH: Hamstrings: BLE tightness  POSTURE: No Significant postural limitations  PALPATION: No remarkable findings  LUMBAR ROM:   AROM eval  Flexion 75% with pain  Extension WFL  Right lateral flexion WFL  Left lateral flexion WFL  Right rotation WFL  Left rotation WFL   (Blank rows = not tested)  LOWER EXTREMITY ROM:   grossly WFL   LOWER EXTREMITY MMT:  grossly 5/5   LUMBAR SPECIAL TESTS:  Straight leg raise test: Positive, Slump test: Negative, and FABER test: Positive  FUNCTIONAL TESTS:  5 times sit to stand: 12.82s with pain   TODAY'S TREATMENT:  DATE:   07/24/22 Nustep L5 6 min DN L glute/piriformis By lead PT M. Albright Checked goals Supine bridges/K2C/lower trunk rotation with orange ball 2x10 SLR B 3 lb 2x10 PROM LE  07/19/22 Bike L3 x13mins  Resisted gait 40# 4 way x4  Hip abd/ext 5# 2x10  Step ups 6" Calf stretch 30s  Leg ext 10# 2x10  HS curls 25# 2x10  Feet on pball rotations, knees to chest, small bridges x10 Deep tissue  massage to L glute and piriformis   07/17/22 NuStep L5 x18mins  Leg press 20# 2x10  SL bridges x10 SLR 2# then with abd 2x10  Deep tissue massage to L glute and piriformis STS with chest press yellow 2x10 Shoulder ext 10# 2x10 AR press 10# 2x10 Calf stretch on slant 30s    07/12/22 Walked outdoors Black t-band flex/ext 2x10 Rows and lat pull downs 25lbs 2x10 Squats with ohp and rotation with yellow ball 2x10 Supine bridges with orange ball 1x10; caused 5/10 pain so dropped the ball and did 10 with knees bent Supine B knee side to sides 2x10 Supine PROM to hips and B LEs    07/10/22 Walk outdoors  Bike L3 x58mins  Calf stretch 30s Rows and Lats 25# 2x10 Ab roll ups 2x12 Deadbugs 2x10 STM to L glutes./piriformis with tennis ball and gun  Passive stretching lower extremities- HS, piriformis, glutes, DKTC    07/05/22 Walk outdoors around back building  Leg ext 10# 2x10 HS curls 25# 2x10 Calf stretch 30s  Calf raises 2x12  blackTB flexion 2x10  NuStep L6 x75mins AR press 10# 2x10  Leg press 30# 2x10  STM to low back and glutes with gun    07/03/22 NuStep L5 x17mins  Passive stretching HS, Piriformis, ITB, Single K2C, Double K2C, lower trunk rotations, glutes laying on MH Feet on ball pall rotations and knees to chest x10 Bridges 2x10 Clamshells green 2x10 STS with yellow ball OHP 2x10  Rows and lat pull downs 20# 2x10 Black TB ext 2x10   06/28/22 Bike L4 x 6 min   MHP to lumbar spine  Lower trunk rotations  LE on Pball bridges, oblq, K2C  Passive stretching HS, Piriformis, ITB, Single K2C, Double K2C, lower trunk rotations, glute STM to L glute and lumbar area with thera gun   06/26/22 Bike L5 x 6 min S2S OHP red ball 2x10 Rows & Lats 20lb 2x10 Bridges x10 LE on Pball bridges, oblq, K2C Passive stretching HS, Piriformis, TB, Single K2C, Double K2C, lower trunk rotaions  06/20/22- EVAL    PATIENT EDUCATION:  Education details: POC and HEP Person educated:  Patient Education method: Explanation Education comprehension: verbalized understanding  HOME EXERCISE PROGRAM: Access Code: [email or web address] URL: [email or web address] Date: 06/20/2022 Prepared by: Cassie Freer  Exercises - Supine Lower Trunk Rotation  - 1 x daily - 7 x weekly - 2 sets - 10 reps - Supine Bridge  - 1 x daily - 7 x weekly - 2 sets - 10 reps - Supine Piriformis Stretch with Foot on Ground  - 1 x daily - 7 x weekly - 2 sets - 30 hold - Supine Figure 4 Piriformis Stretch  - 1 x daily - 7 x weekly - 2 sets - 30 hold - Supine Single Knee to Chest Stretch  - 1 x daily - 7 x weekly - 2 sets - 30 hold - Seated Hamstring Stretch  - 1 x daily - 7 x weekly - 2 sets - 30 hold  ASSESSMENT:  CLINICAL IMPRESSION: Patient reported feeling the same pain in L hip and glute. Dry needling was performed by lead PT to her L glute and piriformis and felt relief. We did some hip and LE strengthening, followed up with PROM which showed tightness in her L hip and LE. She tolerated treatment well and met STG 3 (5 STS <12 sec) and LTG 4 (I with HEP) and is making progress toward all other goals. She would benefit from continued PT to decrease pain and return to PLOF.  OBJECTIVE IMPAIRMENTS: decreased ROM and pain.   ACTIVITY LIMITATIONS: bending, squatting, and locomotion level  PARTICIPATION LIMITATIONS: community activity and yard work  [facility] POTENTIAL: Good  CLINICAL DECISION MAKING: Stable/uncomplicated  EVALUATION COMPLEXITY: Low   GOALS: Goals reviewed with patient? Yes  SHORT TERM GOALS: Target date: 07/18/22  Patient will be independent with initial HEP.  Goal status: Met 06/28/22  2.  Patient will report centralization of radicular symptoms.  Baseline: radiates down LLE, does not go past knee Goal status: MET 07/10/22  3. Patient will complete 5xSTS <12s without pain  Baseline: 12.82s with pain in LB Goal status: INITIAL; MET 9 sec 07/24/22  LONG TERM GOALS: Target  date: 08/15/22  Patient will be independent with advanced/ongoing HEP to improve outcomes and carryover.  Goal status: INITIAL; MET 07/24/22  2.  Patient will report 75% improvement in low back and hip pain to improve QOL.  Baseline: 4/10 Goal status: INITIAL; progressing 07/24/22  3.  Patient will demonstrate full pain free lumbar ROM to perform ADLs.   Baseline: see chart Goal status: INITIAL; progressing 07/24/22  4.  Patient will report 58 on lumbar FOTO to demonstrate improved functional ability.  Baseline: 51 Goal status: INITIAL; progressing 55 07/24/22  5.  Patient will be able to return to her normal exercises routine and gardening without pain Baseline: resistance training 3x/wk, yoga 2x a wk, walking every day Goal status: INITIAL; progressing 07/24/22   PLAN:  PT FREQUENCY: 2x/week  PT DURATION: 8 weeks  PLANNED INTERVENTIONS: Therapeutic exercises, Therapeutic activity, Neuromuscular re-education, Balance training, Gait training, Patient/Family education, Self Care, Joint mobilization, Dry Needling, Electrical stimulation, Spinal mobilization, Cryotherapy, Moist heat, Traction, Ultrasound, and Manual therapy.  PLAN FOR NEXT SESSION: check how dry needling felt throughout the day, continue with strengthening    Cassie Freer, DPT [place], SPTA 07/24/2022, 8:38 AM

## 2022-07-26 ENCOUNTER — Ambulatory Visit: Payer: Medicare Other | Admitting: Physical Therapy

## 2022-07-26 DIAGNOSIS — M5459 Other low back pain: Secondary | ICD-10-CM

## 2022-07-26 DIAGNOSIS — M62830 Muscle spasm of back: Secondary | ICD-10-CM

## 2022-07-26 DIAGNOSIS — M5416 Radiculopathy, lumbar region: Secondary | ICD-10-CM

## 2022-07-26 DIAGNOSIS — M6281 Muscle weakness (generalized): Secondary | ICD-10-CM

## 2022-07-26 NOTE — Therapy (Signed)
[facility] [address] [facility] [place], [place], [address] Phone: [phone]   Fax:  [phone]  Patient Details  Name: Charlotte Brooks MRN: [id number] Date of Birth: 12/02/1945 Referring Provider:  Ralene Cork, DO  OUTPATIENT PHYSICAL THERAPY THORACOLUMBAR TREATMENT Progress Note Reporting Period 06/20/22 to 07/24/22  See note below for Objective Data and Assessment of Progress/Goals.      Patient Name: Charlotte Brooks MRN: [id number] DOB:[birth year], 77 y.o., female Today's Date: 07/26/2022  END OF SESSION:  PT End of Session - 07/26/22 0926     Visit Number 11    Date for PT Re-Evaluation 08/15/22    PT Start Time 0930    PT Stop Time 1015    PT Time Calculation (min) 45 min    Activity Tolerance Patient tolerated treatment well    Behavior During Therapy [facility] for tasks assessed/performed                Past Medical History:  Diagnosis Date   Arthritis    Constipation    Osteoporosis    Gets Reclast   Past Surgical History:  Procedure Laterality Date   COLONOSCOPY     HERNIA REPAIR  2/08   KNEE ARTHROPLASTY     lateral release    KNEE SURGERY     POLYPECTOMY     Patient Active Problem List   Diagnosis Date Noted   Asymptomatic PVD (peripheral vascular disease) (HCC) 04/06/2022   Dysfunction of both eustachian tubes 03/08/2022   Chronic constipation 10/01/2020   Cherry angioma 05/03/2020   Seborrheic keratoses 05/03/2020   Multiple benign nevi 05/03/2020   Atypical glandular cells of undetermined significance (AGUS) on cervical Pap smear 11/15/2017   DDD (degenerative disc disease), lumbar 06/28/2017   Chronic SI joint pain 06/28/2017   Primary osteoarthritis of both hands 06/28/2017   Primary osteoarthritis of both knees 06/28/2017   Senile osteoporosis 05/18/2009   Persistent disorder of initiating or maintaining sleep 05/31/2006    PCP: Alysia Penna  REFERRING PROVIDER: Reino Bellis  REFERRING DIAG:  M54.50 (ICD-10-CM) - Acute left-sided low back pain, unspecified whether sciatica present    Rationale for Evaluation and Treatment: Rehabilitation  THERAPY DIAG:  Other low back pain  Muscle weakness (generalized)  Radiculopathy, lumbar region  Muscle spasm of back  ONSET DATE: 06/06/22  SUBJECTIVE:                                                                                                                                                                                           SUBJECTIVE STATEMENT: Feleing good  PERTINENT HISTORY:  Patient presents today for follow-up on acute low back pain.  She is feeling much better.  Prednisone was helpful but she did experience some jitteriness and insomnia with it.  She only took a couple of doses of the tramadol.  X-rays of her lumbar spine showed multilevel degenerative changes with the most pronounced change at L3-L4.   She takes meloxicam 7.5 mg for other aches and pains.  I advised her to start with 15 mg of meloxicam in the future if she has a return of severe low back pain.     PAIN:  Are you having pain? Yes: NPRS scale: 3-4/10 Pain location: low back, L buttock and radiates down leg  Pain description: grabs me and almost takes my breath, it comes and goes, worse in the AM Aggravating factors: not anything really  Relieving factors: eases up the more I move around  PRECAUTIONS: None  WEIGHT BEARING RESTRICTIONS: No  FALLS:  Has patient fallen in last 6 months? No  LIVING ENVIRONMENT: Lives with: lives with their spouse Lives in: House/apartment   OCCUPATION: Retired  PLOF: Independent  PATIENT GOALS: to get back to my life as it was working out and walking and gardening   NEXT MD VISIT: 08/15/22  OBJECTIVE:   DIAGNOSTIC FINDINGS:  There are five non-rib bearing lumbar-type vertebral bodies with riblets at T12. There is dextrocurvature of the lumbar spine with multilevel asymmetric  disc space height loss. There is straightening of the lumbar lordosis with trace anterolisthesis of L1-2, L4-5 and L5-S1 and mild retrolisthesis of L2-3. There is no evidence for acute fracture or subluxation. Severe intervertebral disc space height loss with reactive endplate sclerosis and subcortical cystic change at L3-4. Moderate to severe intervertebral disc space height loss at L2-3 and L4-5. Atherosclerotic calcifications.   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  PATIENT SURVEYS:  FOTO 51  SCREENING FOR RED FLAGS: Bowel or bladder incontinence: No Spinal tumors: No Cauda equina syndrome: No Compression fracture: No Abdominal aneurysm: No  COGNITION: Overall cognitive status: Within functional limits for tasks assessed     SENSATION: WFL  MUSCLE LENGTH: Hamstrings: BLE tightness  POSTURE: No Significant postural limitations  PALPATION: No remarkable findings  LUMBAR ROM:   AROM eval  Flexion 75% with pain  Extension WFL  Right lateral flexion WFL  Left lateral flexion WFL  Right rotation WFL  Left rotation WFL   (Blank rows = not tested)  LOWER EXTREMITY ROM:   grossly WFL   LOWER EXTREMITY MMT:  grossly 5/5   LUMBAR SPECIAL TESTS:  Straight leg raise test: Positive, Slump test: Negative, and FABER test: Positive  FUNCTIONAL TESTS:  5 times sit to stand: 12.82s with pain   TODAY'S TREATMENT:  DATE:   07/26/22 Trunk flex/ext black t-band 2x10 Bike L 2.5 6 mins Resisted gait 40lb each way x5 n L hip ext/abd/add 5# 2x12 Step ups and down 6 in steps holding blue ball x10 Mini squats holding blue ball 2x10 Lateral band walks green x4 STS green t-band around thighs 2x10 PROM LE  07/24/22 Nustep L5 6 min DN L glute/piriformis By lead PT M. Albright Checked goals Supine  bridges/K2C/lower trunk rotation with orange ball 2x10 SLR B 3 lb 2x10 PROM LE  07/19/22 Bike L3 x66mins  Resisted gait 40# 4 way x4  Hip abd/ext 5# 2x10  Step ups 6" Calf stretch 30s  Leg ext 10# 2x10  HS curls 25# 2x10  Feet on pball rotations, knees to chest, small bridges x10 Deep tissue massage to L glute and piriformis   07/17/22 NuStep L5 x40mins  Leg press 20# 2x10  SL bridges x10 SLR 2# then with abd 2x10  Deep tissue massage to L glute and piriformis STS with chest press yellow 2x10 Shoulder ext 10# 2x10 AR press 10# 2x10 Calf stretch on slant 30s    07/12/22 Walked outdoors Black t-band flex/ext 2x10 Rows and lat pull downs 25lbs 2x10 Squats with ohp and rotation with yellow ball 2x10 Supine bridges with orange ball 1x10; caused 5/10 pain so dropped the ball and did 10 with knees bent Supine B knee side to sides 2x10 Supine PROM to hips and B LEs    07/10/22 Walk outdoors  Bike L3 x24mins  Calf stretch 30s Rows and Lats 25# 2x10 Ab roll ups 2x12 Deadbugs 2x10 STM to L glutes./piriformis with tennis ball and gun  Passive stretching lower extremities- HS, piriformis, glutes, DKTC    07/05/22 Walk outdoors around back building  Leg ext 10# 2x10 HS curls 25# 2x10 Calf stretch 30s  Calf raises 2x12  blackTB flexion 2x10  NuStep L6 x12mins AR press 10# 2x10  Leg press 30# 2x10  STM to low back and glutes with gun    07/03/22 NuStep L5 x33mins  Passive stretching HS, Piriformis, ITB, Single K2C, Double K2C, lower trunk rotations, glutes laying on MH Feet on ball pall rotations and knees to chest x10 Bridges 2x10 Clamshells green 2x10 STS with yellow ball OHP 2x10  Rows and lat pull downs 20# 2x10 Black TB ext 2x10   06/28/22 Bike L4 x 6 min   MHP to lumbar spine  Lower trunk rotations  LE on Pball bridges, oblq, K2C  Passive stretching HS, Piriformis, ITB, Single K2C, Double K2C, lower trunk rotations, glute STM to L glute and lumbar area with  thera gun   06/26/22 Bike L5 x 6 min S2S OHP red ball 2x10 Rows & Lats 20lb 2x10 Bridges x10 LE on Pball bridges, oblq, K2C Passive stretching HS, Piriformis, TB, Single K2C, Double K2C, lower trunk rotaions  06/20/22- EVAL    PATIENT EDUCATION:  Education details: POC and HEP Person educated: Patient Education method: Explanation Education comprehension: verbalized understanding  HOME EXERCISE PROGRAM: Access Code: [email or web address] URL: [email or web address] Date: 06/20/2022 Prepared by: Cassie Freer  Exercises - Supine Lower Trunk Rotation  - 1 x daily - 7 x weekly - 2 sets - 10 reps - Supine Bridge  - 1 x daily - 7 x weekly - 2 sets - 10 reps - Supine Piriformis Stretch with Foot on Ground  - 1 x daily - 7 x weekly - 2 sets - 30 hold - Supine Figure 4 Piriformis Stretch  - 1 x  daily - 7 x weekly - 2 sets - 30 hold - Supine Single Knee to Chest Stretch  - 1 x daily - 7 x weekly - 2 sets - 30 hold - Seated Hamstring Stretch  - 1 x daily - 7 x weekly - 2 sets - 30 hold  ASSESSMENT:  CLINICAL IMPRESSION: Patient had an MRI yesterday that left her back sore so we started with some trunk flex/ext. She said the dry needling caused her to be achy so wants to take a break form it today, but is willing to try it again another session. She had minimal pain prior to the session. This treatment focused on L hip strengthening and functional activities which were tolerated well. Pt favors her R hip when squatting and L knee goes medial so verbal cues were given to emphasize putting weight on L hip and band walks were added to emphasize hip abduction. She holds feed for her animals while going down/up steps so we added steps while holding weight. She would benefit from continued PT to increase hip strength and decrease her pain when doing activities.  OBJECTIVE IMPAIRMENTS: decreased ROM and pain.   ACTIVITY LIMITATIONS: bending, squatting, and locomotion level  PARTICIPATION  LIMITATIONS: community activity and yard work  [facility] POTENTIAL: Good  CLINICAL DECISION MAKING: Stable/uncomplicated  EVALUATION COMPLEXITY: Low   GOALS: Goals reviewed with patient? Yes  SHORT TERM GOALS: Target date: 07/18/22  Patient will be independent with initial HEP.  Goal status: Met 06/28/22  2.  Patient will report centralization of radicular symptoms.  Baseline: radiates down LLE, does not go past knee Goal status: MET 07/10/22  3. Patient will complete 5xSTS <12s without pain  Baseline: 12.82s with pain in LB Goal status: INITIAL; MET 9 sec 07/24/22  LONG TERM GOALS: Target date: 08/15/22  Patient will be independent with advanced/ongoing HEP to improve outcomes and carryover.  Goal status: INITIAL; MET 07/24/22  2.  Patient will report 75% improvement in low back and hip pain to improve QOL.  Baseline: 4/10 Goal status: INITIAL; progressing 07/24/22  3.  Patient will demonstrate full pain free lumbar ROM to perform ADLs.   Baseline: see chart Goal status: INITIAL; progressing 07/24/22  4.  Patient will report 34 on lumbar FOTO to demonstrate improved functional ability.  Baseline: 51 Goal status: INITIAL; progressing 55 07/24/22  5.  Patient will be able to return to her normal exercises routine and gardening without pain Baseline: resistance training 3x/wk, yoga 2x a wk, walking every day Goal status: INITIAL; progressing 07/24/22   PLAN:  PT FREQUENCY: 2x/week  PT DURATION: 8 weeks  PLANNED INTERVENTIONS: Therapeutic exercises, Therapeutic activity, Neuromuscular re-education, Balance training, Gait training, Patient/Family education, Self Care, Joint mobilization, Dry Needling, Electrical stimulation, Spinal mobilization, Cryotherapy, Moist heat, Traction, Ultrasound, and Manual therapy.  PLAN FOR NEXT SESSION: maybe DN, continue with hip strengthening and functional activities     George Ina, SPTA 07/26/2022, 9:27 AM

## 2022-07-27 ENCOUNTER — Other Ambulatory Visit: Payer: Self-pay

## 2022-07-27 DIAGNOSIS — M545 Low back pain, unspecified: Secondary | ICD-10-CM

## 2022-08-01 ENCOUNTER — Ambulatory Visit: Payer: Medicare Other | Admitting: Physical Therapy

## 2022-08-01 ENCOUNTER — Encounter: Payer: Self-pay | Admitting: Physical Therapy

## 2022-08-01 DIAGNOSIS — M5459 Other low back pain: Secondary | ICD-10-CM

## 2022-08-01 DIAGNOSIS — M5416 Radiculopathy, lumbar region: Secondary | ICD-10-CM

## 2022-08-01 DIAGNOSIS — M6281 Muscle weakness (generalized): Secondary | ICD-10-CM

## 2022-08-01 DIAGNOSIS — M62830 Muscle spasm of back: Secondary | ICD-10-CM

## 2022-08-01 NOTE — Therapy (Signed)
OUTPATIENT PHYSICAL THERAPY THORACOLUMBAR TREATMENT     Patient Name: Charlotte Brooks MRN: [id number] DOB:1945/11/26, 77 y.o., female Today's Date: 08/01/2022  END OF SESSION:  PT End of Session - 08/01/22 0957     Visit Number 12    Date for PT Re-Evaluation 08/15/22    Authorization Type [employer] Medicare    PT Start Time 0928    PT Stop Time 1015    PT Time Calculation (min) 47 min    Activity Tolerance Patient tolerated treatment well    Behavior During Therapy [facility] for tasks assessed/performed                Past Medical History:  Diagnosis Date   Arthritis    Constipation    Osteoporosis    Gets Reclast   Past Surgical History:  Procedure Laterality Date   COLONOSCOPY     HERNIA REPAIR  2/08   KNEE ARTHROPLASTY     lateral release    KNEE SURGERY     POLYPECTOMY     Patient Active Problem List   Diagnosis Date Noted   Asymptomatic PVD (peripheral vascular disease) (HCC) 04/06/2022   Dysfunction of both eustachian tubes 03/08/2022   Chronic constipation 10/01/2020   Cherry angioma 05/03/2020   Seborrheic keratoses 05/03/2020   Multiple benign nevi 05/03/2020   Atypical glandular cells of undetermined significance (AGUS) on cervical Pap smear 11/15/2017   DDD (degenerative disc disease), lumbar 06/28/2017   Chronic SI joint pain 06/28/2017   Primary osteoarthritis of both hands 06/28/2017   Primary osteoarthritis of both knees 06/28/2017   Senile osteoporosis 05/18/2009   Persistent disorder of initiating or maintaining sleep 05/31/2006    PCP: Alysia Penna  REFERRING PROVIDER: Reino Bellis  REFERRING DIAG:  M54.50 (ICD-10-CM) - Acute left-sided low back pain, unspecified whether sciatica present    Rationale for Evaluation and Treatment: Rehabilitation  THERAPY DIAG:  Other low back pain  Muscle weakness (generalized)  Radiculopathy, lumbar region  Muscle spasm of back  ONSET DATE: 06/06/22  SUBJECTIVE:                                                                                                                                                                                            SUBJECTIVE STATEMENT: Had MRI, has stenosis and some disc bulges, has appointment in future regarding this with Dr. Christell Constant  PERTINENT HISTORY:  Patient presents today for follow-up on acute low back pain.  She is feeling much better.  Prednisone was helpful but she did experience some jitteriness and insomnia with it.  She only took a couple of doses of the  tramadol.  X-rays of her lumbar spine showed multilevel degenerative changes with the most pronounced change at L3-L4.   She takes meloxicam 7.5 mg for other aches and pains.  I advised her to start with 15 mg of meloxicam in the future if she has a return of severe low back pain.     PAIN:  Are you having pain? Yes: NPRS scale: 3-4/10 Pain location: low back, L buttock and radiates down leg  Pain description: grabs me and almost takes my breath, it comes and goes, worse in the AM Aggravating factors: not anything really  Relieving factors: eases up the more I move around  PRECAUTIONS: None  WEIGHT BEARING RESTRICTIONS: No  FALLS:  Has patient fallen in last 6 months? No  LIVING ENVIRONMENT: Lives with: lives with their spouse Lives in: House/apartment   OCCUPATION: Retired  PLOF: Independent  PATIENT GOALS: to get back to my life as it was working out and walking and gardening   NEXT MD VISIT: 08/15/22  OBJECTIVE:   DIAGNOSTIC FINDINGS:  There are five non-rib bearing lumbar-type vertebral bodies with riblets at T12. There is dextrocurvature of the lumbar spine with multilevel asymmetric disc space height loss. There is straightening of the lumbar lordosis with trace anterolisthesis of L1-2, L4-5 and L5-S1 and mild retrolisthesis of L2-3. There is no evidence for acute fracture or subluxation. Severe intervertebral disc space height loss with reactive endplate  sclerosis and subcortical cystic change at L3-4. Moderate to severe intervertebral disc space height loss at L2-3 and L4-5. Atherosclerotic calcifications.   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  PATIENT SURVEYS:  FOTO 51  SCREENING FOR RED FLAGS: Bowel or bladder incontinence: No Spinal tumors: No Cauda equina syndrome: No Compression fracture: No Abdominal aneurysm: No  COGNITION: Overall cognitive status: Within functional limits for tasks assessed     SENSATION: WFL  MUSCLE LENGTH: Hamstrings: BLE tightness  POSTURE: No Significant postural limitations  PALPATION: No remarkable findings  LUMBAR ROM:   AROM eval  Flexion 75% with pain  Extension WFL  Right lateral flexion WFL  Left lateral flexion WFL  Right rotation WFL  Left rotation WFL   (Blank rows = not tested)  LOWER EXTREMITY ROM:   grossly WFL   LOWER EXTREMITY MMT:  grossly 5/5   LUMBAR SPECIAL TESTS:  Straight leg raise test: Positive, Slump test: Negative, and FABER test: Positive  FUNCTIONAL TESTS:  5 times sit to stand: 12.82s with pain   TODAY'S TREATMENT:                                                                                                                              DATE:  08/01/22 PROM/stretches of the LE's Education on posture body mechanics, stretching and taking breaks to decrease stress and strain on the low back STM to the left buttock Lumbar traction 55# static  07/26/22 Trunk flex/ext black t-band 2x10 Bike  L 2.5 6 mins Resisted gait 40lb each way x5 n L hip ext/abd/add 5# 2x12 Step ups and down 6 in steps holding blue ball x10 Mini squats holding blue ball 2x10 Lateral band walks green x4 STS green t-band around thighs 2x10 PROM LE  07/24/22 Nustep L5 6 min DN L glute/piriformis By lead PT M. Mannix Kroeker Checked goals Supine bridges/K2C/lower trunk rotation with orange ball 2x10 SLR B 3 lb 2x10 PROM  LE  07/19/22 Bike L3 x35mins  Resisted gait 40# 4 way x4  Hip abd/ext 5# 2x10  Step ups 6" Calf stretch 30s  Leg ext 10# 2x10  HS curls 25# 2x10  Feet on pball rotations, knees to chest, small bridges x10 Deep tissue massage to L glute and piriformis   07/17/22 NuStep L5 x54mins  Leg press 20# 2x10  SL bridges x10 SLR 2# then with abd 2x10  Deep tissue massage to L glute and piriformis STS with chest press yellow 2x10 Shoulder ext 10# 2x10 AR press 10# 2x10 Calf stretch on slant 30s    07/12/22 Walked outdoors Black t-band flex/ext 2x10 Rows and lat pull downs 25lbs 2x10 Squats with ohp and rotation with yellow ball 2x10 Supine bridges with orange ball 1x10; caused 5/10 pain so dropped the ball and did 10 with knees bent Supine B knee side to sides 2x10 Supine PROM to hips and B LEs    07/10/22 Walk outdoors  Bike L3 x56mins  Calf stretch 30s Rows and Lats 25# 2x10 Ab roll ups 2x12 Deadbugs 2x10 STM to L glutes./piriformis with tennis ball and gun  Passive stretching lower extremities- HS, piriformis, glutes, DKTC    07/05/22 Walk outdoors around back building  Leg ext 10# 2x10 HS curls 25# 2x10 Calf stretch 30s  Calf raises 2x12  blackTB flexion 2x10  NuStep L6 x38mins AR press 10# 2x10  Leg press 30# 2x10  STM to low back and glutes with gun    07/03/22 NuStep L5 x66mins  Passive stretching HS, Piriformis, ITB, Single K2C, Double K2C, lower trunk rotations, glutes laying on MH Feet on ball pall rotations and knees to chest x10 Bridges 2x10 Clamshells green 2x10 STS with yellow ball OHP 2x10  Rows and lat pull downs 20# 2x10 Black TB ext 2x10   06/28/22 Bike L4 x 6 min   MHP to lumbar spine  Lower trunk rotations  LE on Pball bridges, oblq, K2C  Passive stretching HS, Piriformis, ITB, Single K2C, Double K2C, lower trunk rotations, glute STM to L glute and lumbar area with thera gun   06/26/22 Bike L5 x 6 min S2S OHP red ball 2x10 Rows & Lats 20lb  2x10 Bridges x10 LE on Pball bridges, oblq, K2C Passive stretching HS, Piriformis, TB, Single K2C, Double K2C, lower trunk rotaions  06/20/22- EVAL    PATIENT EDUCATION:  Education details: POC and HEP Person educated: Patient Education method: Explanation Education comprehension: verbalized understanding  HOME EXERCISE PROGRAM: Access Code: [email or web address] URL: [email or web address] Date: 06/20/2022 Prepared by: Cassie Freer  Exercises - Supine Lower Trunk Rotation  - 1 x daily - 7 x weekly - 2 sets - 10 reps - Supine Bridge  - 1 x daily - 7 x weekly - 2 sets - 10 reps - Supine Piriformis Stretch with Foot on Ground  - 1 x daily - 7 x weekly - 2 sets - 30 hold - Supine Figure 4 Piriformis Stretch  - 1 x daily - 7 x weekly - 2 sets - 30  hold - Supine Single Knee to Chest Stretch  - 1 x daily - 7 x weekly - 2 sets - 30 hold - Seated Hamstring Stretch  - 1 x daily - 7 x weekly - 2 sets - 30 hold  ASSESSMENT:  CLINICAL IMPRESSION: Patient had an MRI reports that she has stenosis and some bulging discs, focused todays treatment on the flexibility and the STM to the buttock, did some education about self care for home and to decreased stress and strain on this.  We added traction.  She would benefit from continued PT to increase hip strength and decrease her pain when doing activities.  OBJECTIVE IMPAIRMENTS: decreased ROM and pain.   ACTIVITY LIMITATIONS: bending, squatting, and locomotion level  PARTICIPATION LIMITATIONS: community activity and yard work  [facility] POTENTIAL: Good  CLINICAL DECISION MAKING: Stable/uncomplicated  EVALUATION COMPLEXITY: Low   GOALS: Goals reviewed with patient? Yes  SHORT TERM GOALS: Target date: 07/18/22  Patient will be independent with initial HEP.  Goal status: Met 06/28/22  2.  Patient will report centralization of radicular symptoms.  Baseline: radiates down LLE, does not go past knee Goal status: MET 07/10/22  3. Patient will  complete 5xSTS <12s without pain  Baseline: 12.82s with pain in LB Goal status: INITIAL; MET 9 sec 07/24/22  LONG TERM GOALS: Target date: 08/15/22  Patient will be independent with advanced/ongoing HEP to improve outcomes and carryover.  Goal status: INITIAL; MET 07/24/22  2.  Patient will report 75% improvement in low back and hip pain to improve QOL.  Baseline: 4/10 Goal status: INITIAL; progressing 07/24/22  3.  Patient will demonstrate full pain free lumbar ROM to perform ADLs.   Baseline: see chart Goal status: INITIAL; progressing 07/24/22  4.  Patient will report 64 on lumbar FOTO to demonstrate improved functional ability.  Baseline: 51 Goal status: INITIAL; progressing 55 07/24/22  5.  Patient will be able to return to her normal exercises routine and gardening without pain Baseline: resistance training 3x/wk, yoga 2x a wk, walking every day Goal status: INITIAL; progressing 07/24/22   PLAN:  PT FREQUENCY: 2x/week  PT DURATION: 8 weeks  PLANNED INTERVENTIONS: Therapeutic exercises, Therapeutic activity, Neuromuscular re-education, Balance training, Gait training, Patient/Family education, Self Care, Joint mobilization, Dry Needling, Electrical stimulation, Spinal mobilization, Cryotherapy, Moist heat, Traction, Ultrasound, and Manual therapy.  PLAN FOR NEXT SESSION: see how the traction did with her symptoms     Jearld Lesch, PT,  08/01/2022, 9:57 AM

## 2022-08-03 ENCOUNTER — Encounter: Payer: Self-pay | Admitting: Physical Therapy

## 2022-08-03 ENCOUNTER — Ambulatory Visit: Payer: Medicare Other | Admitting: Physical Therapy

## 2022-08-03 DIAGNOSIS — M5459 Other low back pain: Secondary | ICD-10-CM | POA: Diagnosis not present

## 2022-08-03 DIAGNOSIS — M6281 Muscle weakness (generalized): Secondary | ICD-10-CM

## 2022-08-03 DIAGNOSIS — M5416 Radiculopathy, lumbar region: Secondary | ICD-10-CM

## 2022-08-03 DIAGNOSIS — M62830 Muscle spasm of back: Secondary | ICD-10-CM

## 2022-08-03 NOTE — Therapy (Signed)
OUTPATIENT PHYSICAL THERAPY THORACOLUMBAR TREATMENT     Patient Name: Charlotte Brooks MRN: [id number] DOB:1945/09/17, 77 y.o., female Today's Date: 08/03/2022  END OF SESSION:  PT End of Session - 08/03/22 0926     Visit Number 13    Date for PT Re-Evaluation 08/15/22    PT Start Time 0930    PT Stop Time 1015    PT Time Calculation (min) 45 min    Activity Tolerance Patient tolerated treatment well    Behavior During Therapy [facility] for tasks assessed/performed                Past Medical History:  Diagnosis Date   Arthritis    Constipation    Osteoporosis    Gets Reclast   Past Surgical History:  Procedure Laterality Date   COLONOSCOPY     HERNIA REPAIR  2/08   KNEE ARTHROPLASTY     lateral release    KNEE SURGERY     POLYPECTOMY     Patient Active Problem List   Diagnosis Date Noted   Asymptomatic PVD (peripheral vascular disease) (HCC) 04/06/2022   Dysfunction of both eustachian tubes 03/08/2022   Chronic constipation 10/01/2020   Cherry angioma 05/03/2020   Seborrheic keratoses 05/03/2020   Multiple benign nevi 05/03/2020   Atypical glandular cells of undetermined significance (AGUS) on cervical Pap smear 11/15/2017   DDD (degenerative disc disease), lumbar 06/28/2017   Chronic SI joint pain 06/28/2017   Primary osteoarthritis of both hands 06/28/2017   Primary osteoarthritis of both knees 06/28/2017   Senile osteoporosis 05/18/2009   Persistent disorder of initiating or maintaining sleep 05/31/2006    PCP: Alysia Penna  REFERRING PROVIDER: Reino Bellis  REFERRING DIAG:  M54.50 (ICD-10-CM) - Acute left-sided low back pain, unspecified whether sciatica present    Rationale for Evaluation and Treatment: Rehabilitation  THERAPY DIAG:  Other low back pain  Muscle weakness (generalized)  Muscle spasm of back  Radiculopathy, lumbar region  ONSET DATE: 06/06/22  SUBJECTIVE:                                                                                                                                                                                            SUBJECTIVE STATEMENT: Have had more pain sine last time, but has been doing a lot of different stuff trying yo live a normal life  PERTINENT HISTORY:  Patient presents today for follow-up on acute low back pain.  She is feeling much better.  Prednisone was helpful but she did experience some jitteriness and insomnia with it.  She only took a couple of doses of the tramadol.  X-rays of her lumbar spine showed multilevel degenerative changes with the most pronounced change at L3-L4.   She takes meloxicam 7.5 mg for other aches and pains.  I advised her to start with 15 mg of meloxicam in the future if she has a return of severe low back pain.     PAIN:  Are you having pain? Yes: NPRS scale: 4/10 Pain location: low back, L buttock and radiates down leg  Pain description: grabs me and almost takes my breath, it comes and goes, worse in the AM Aggravating factors: not anything really  Relieving factors: eases up the more I move around  PRECAUTIONS: None  WEIGHT BEARING RESTRICTIONS: No  FALLS:  Has patient fallen in last 6 months? No  LIVING ENVIRONMENT: Lives with: lives with their spouse Lives in: House/apartment   OCCUPATION: Retired  PLOF: Independent  PATIENT GOALS: to get back to my life as it was working out and walking and gardening   NEXT MD VISIT: 08/15/22  OBJECTIVE:   DIAGNOSTIC FINDINGS:  There are five non-rib bearing lumbar-type vertebral bodies with riblets at T12. There is dextrocurvature of the lumbar spine with multilevel asymmetric disc space height loss. There is straightening of the lumbar lordosis with trace anterolisthesis of L1-2, L4-5 and L5-S1 and mild retrolisthesis of L2-3. There is no evidence for acute fracture or subluxation. Severe intervertebral disc space height loss with reactive endplate sclerosis and subcortical cystic  change at L3-4. Moderate to severe intervertebral disc space height loss at L2-3 and L4-5. Atherosclerotic calcifications.   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  PATIENT SURVEYS:  FOTO 51  SCREENING FOR RED FLAGS: Bowel or bladder incontinence: No Spinal tumors: No Cauda equina syndrome: No Compression fracture: No Abdominal aneurysm: No  COGNITION: Overall cognitive status: Within functional limits for tasks assessed     SENSATION: WFL  MUSCLE LENGTH: Hamstrings: BLE tightness  POSTURE: No Significant postural limitations  PALPATION: No remarkable findings  LUMBAR ROM:   AROM eval  Flexion 75% with pain  Extension WFL  Right lateral flexion WFL  Left lateral flexion WFL  Right rotation WFL  Left rotation WFL   (Blank rows = not tested)  LOWER EXTREMITY ROM:   grossly WFL   LOWER EXTREMITY MMT:  grossly 5/5   LUMBAR SPECIAL TESTS:  Straight leg raise test: Positive, Slump test: Negative, and FABER test: Positive  FUNCTIONAL TESTS:  5 times sit to stand: 12.82s with pain   TODAY'S TREATMENT:                                                                                                                              DATE:  08/03/22 Bike L3.5 x 6 min Shoulder Ext 5lb 2x10 PROM/stretches of the LE's Bridges x10 SLR x10 each STM to left buttocks  Lumbar traction 55# static  08/01/22 PROM/stretches of the LE's Education on posture body mechanics, stretching and taking  breaks to decrease stress and strain on the low back STM to the left buttock Lumbar traction 55# static  07/26/22 Trunk flex/ext black t-band 2x10 Bike L 2.5 6 mins Resisted gait 40lb each way x5 n L hip ext/abd/add 5# 2x12 Step ups and down 6 in steps holding blue ball x10 Mini squats holding blue ball 2x10 Lateral band walks green x4 STS green t-band around thighs 2x10 PROM LE  07/24/22 Nustep L5 6 min DN L glute/piriformis By lead  PT M. Albright Checked goals Supine bridges/K2C/lower trunk rotation with orange ball 2x10 SLR B 3 lb 2x10 PROM LE  07/19/22 Bike L3 x80mins  Resisted gait 40# 4 way x4  Hip abd/ext 5# 2x10  Step ups 6" Calf stretch 30s  Leg ext 10# 2x10  HS curls 25# 2x10  Feet on pball rotations, knees to chest, small bridges x10 Deep tissue massage to L glute and piriformis   07/17/22 NuStep L5 x78mins  Leg press 20# 2x10  SL bridges x10 SLR 2# then with abd 2x10  Deep tissue massage to L glute and piriformis STS with chest press yellow 2x10 Shoulder ext 10# 2x10 AR press 10# 2x10 Calf stretch on slant 30s    07/12/22 Walked outdoors Black t-band flex/ext 2x10 Rows and lat pull downs 25lbs 2x10 Squats with ohp and rotation with yellow ball 2x10 Supine bridges with orange ball 1x10; caused 5/10 pain so dropped the ball and did 10 with knees bent Supine B knee side to sides 2x10 Supine PROM to hips and B LEs    07/10/22 Walk outdoors  Bike L3 x7mins  Calf stretch 30s Rows and Lats 25# 2x10 Ab roll ups 2x12 Deadbugs 2x10 STM to L glutes./piriformis with tennis ball and gun  Passive stretching lower extremities- HS, piriformis, glutes, DKTC    07/05/22 Walk outdoors around back building  Leg ext 10# 2x10 HS curls 25# 2x10 Calf stretch 30s  Calf raises 2x12  blackTB flexion 2x10  NuStep L6 x38mins AR press 10# 2x10  Leg press 30# 2x10  STM to low back and glutes with gun    07/03/22 NuStep L5 x93mins  Passive stretching HS, Piriformis, ITB, Single K2C, Double K2C, lower trunk rotations, glutes laying on MH Feet on ball pall rotations and knees to chest x10 Bridges 2x10 Clamshells green 2x10 STS with yellow ball OHP 2x10  Rows and lat pull downs 20# 2x10 Black TB ext 2x10   06/28/22 Bike L4 x 6 min   MHP to lumbar spine  Lower trunk rotations  LE on Pball bridges, oblq, K2C  Passive stretching HS, Piriformis, ITB, Single K2C, Double K2C, lower trunk rotations,  glute STM to L glute and lumbar area with thera gun   06/26/22 Bike L5 x 6 min S2S OHP red ball 2x10 Rows & Lats 20lb 2x10 Bridges x10 LE on Pball bridges, oblq, K2C Passive stretching HS, Piriformis, TB, Single K2C, Double K2C, lower trunk rotaions  06/20/22- EVAL    PATIENT EDUCATION:  Education details: POC and HEP Person educated: Patient Education method: Explanation Education comprehension: verbalized understanding  HOME EXERCISE PROGRAM: Access Code: [email or web address] URL: [email or web address] Date: 06/20/2022 Prepared by: Cassie Freer  Exercises - Supine Lower Trunk Rotation  - 1 x daily - 7 x weekly - 2 sets - 10 reps - Supine Bridge  - 1 x daily - 7 x weekly - 2 sets - 10 reps - Supine Piriformis Stretch with Foot on Ground  - 1 x daily - 7 x  weekly - 2 sets - 30 hold - Supine Figure 4 Piriformis Stretch  - 1 x daily - 7 x weekly - 2 sets - 30 hold - Supine Single Knee to Chest Stretch  - 1 x daily - 7 x weekly - 2 sets - 30 hold - Seated Hamstring Stretch  - 1 x daily - 7 x weekly - 2 sets - 30 hold  ASSESSMENT:  CLINICAL IMPRESSION: Continued with previous session interventions due to recent MRI findings. Did ad a little posterior chain strengthening without issue. Slight increase in traction pull tolerated well.Marland Kitchen  She would benefit from continued PT to increase hip strength and decrease her pain when doing activities.  OBJECTIVE IMPAIRMENTS: decreased ROM and pain.   ACTIVITY LIMITATIONS: bending, squatting, and locomotion level  PARTICIPATION LIMITATIONS: community activity and yard work  [facility] POTENTIAL: Good  CLINICAL DECISION MAKING: Stable/uncomplicated  EVALUATION COMPLEXITY: Low   GOALS: Goals reviewed with patient? Yes  SHORT TERM GOALS: Target date: 07/18/22  Patient will be independent with initial HEP.  Goal status: Met 06/28/22  2.  Patient will report centralization of radicular symptoms.  Baseline: radiates down LLE, does not go  past knee Goal status: MET 07/10/22  3. Patient will complete 5xSTS <12s without pain  Baseline: 12.82s with pain in LB Goal status: INITIAL; MET 9 sec 07/24/22  LONG TERM GOALS: Target date: 08/15/22  Patient will be independent with advanced/ongoing HEP to improve outcomes and carryover.  Goal status: INITIAL; MET 07/24/22  2.  Patient will report 75% improvement in low back and hip pain to improve QOL.  Baseline: 4/10 Goal status: INITIAL; progressing 07/24/22  3.  Patient will demonstrate full pain free lumbar ROM to perform ADLs.   Baseline: see chart Goal status: INITIAL; progressing 07/24/22  4.  Patient will report 77 on lumbar FOTO to demonstrate improved functional ability.  Baseline: 51 Goal status: INITIAL; progressing 55 07/24/22  5.  Patient will be able to return to her normal exercises routine and gardening without pain Baseline: resistance training 3x/wk, yoga 2x a wk, walking every day Goal status: INITIAL; progressing 07/24/22   PLAN:  PT FREQUENCY: 2x/week  PT DURATION: 8 weeks  PLANNED INTERVENTIONS: Therapeutic exercises, Therapeutic activity, Neuromuscular re-education, Balance training, Gait training, Patient/Family education, Self Care, Joint mobilization, Dry Needling, Electrical stimulation, Spinal mobilization, Cryotherapy, Moist heat, Traction, Ultrasound, and Manual therapy.  PLAN FOR NEXT SESSION: see how the traction did with her symptoms     Grayce Sessions, PTA,  08/03/2022, 9:27 AM

## 2022-08-08 ENCOUNTER — Encounter: Payer: Self-pay | Admitting: Physical Therapy

## 2022-08-08 ENCOUNTER — Ambulatory Visit: Payer: Medicare Other | Admitting: Physical Therapy

## 2022-08-08 DIAGNOSIS — M6281 Muscle weakness (generalized): Secondary | ICD-10-CM

## 2022-08-08 DIAGNOSIS — M5459 Other low back pain: Secondary | ICD-10-CM | POA: Diagnosis not present

## 2022-08-08 DIAGNOSIS — M62830 Muscle spasm of back: Secondary | ICD-10-CM

## 2022-08-08 DIAGNOSIS — M5416 Radiculopathy, lumbar region: Secondary | ICD-10-CM

## 2022-08-08 NOTE — Therapy (Signed)
OUTPATIENT PHYSICAL THERAPY THORACOLUMBAR TREATMENT     Patient Name: Charlotte Brooks MRN: [id number] DOB:[birth year], 77 y.o., female Today's Date: 08/08/2022  END OF SESSION:  PT End of Session - 08/08/22 0841     Visit Number 14    Date for PT Re-Evaluation 08/15/22    Authorization Type [employer] Medicare    PT Start Time 0840    PT Stop Time 0928    PT Time Calculation (min) 48 min    Activity Tolerance Patient tolerated treatment well    Behavior During Therapy [facility] for tasks assessed/performed                Past Medical History:  Diagnosis Date   Arthritis    Constipation    Osteoporosis    Gets Reclast   Past Surgical History:  Procedure Laterality Date   COLONOSCOPY     HERNIA REPAIR  2/08   KNEE ARTHROPLASTY     lateral release    KNEE SURGERY     POLYPECTOMY     Patient Active Problem List   Diagnosis Date Noted   Asymptomatic PVD (peripheral vascular disease) (HCC) 04/06/2022   Dysfunction of both eustachian tubes 03/08/2022   Chronic constipation 10/01/2020   Cherry angioma 05/03/2020   Seborrheic keratoses 05/03/2020   Multiple benign nevi 05/03/2020   Atypical glandular cells of undetermined significance (AGUS) on cervical Pap smear 11/15/2017   DDD (degenerative disc disease), lumbar 06/28/2017   Chronic SI joint pain 06/28/2017   Primary osteoarthritis of both hands 06/28/2017   Primary osteoarthritis of both knees 06/28/2017   Senile osteoporosis 05/18/2009   Persistent disorder of initiating or maintaining sleep 05/31/2006    PCP: Alysia Penna  REFERRING PROVIDER: Reino Bellis  REFERRING DIAG:  M54.50 (ICD-10-CM) - Acute left-sided low back pain, unspecified whether sciatica present    Rationale for Evaluation and Treatment: Rehabilitation  THERAPY DIAG:  Other low back pain  Muscle weakness (generalized)  Muscle spasm of back  Radiculopathy, lumbar region  ONSET DATE: 06/06/22  SUBJECTIVE:                                                                                                                                                                                            SUBJECTIVE STATEMENT: Patient with no real changes in pain levels recently, the traction has caused increased pain at times.  Will see the MD next Friday August 2nd  PERTINENT HISTORY:  Patient presents today for follow-up on acute low back pain.  She is feeling much better.  Prednisone was helpful but she did experience some jitteriness and insomnia with it.  She only took a couple of doses of the tramadol.  X-rays of her lumbar spine showed multilevel degenerative changes with the most pronounced change at L3-L4.   She takes meloxicam 7.5 mg for other aches and pains.  I advised her to start with 15 mg of meloxicam in the future if she has a return of severe low back pain.     PAIN:  Are you having pain? Yes: NPRS scale: 4/10 Pain location: low back, L buttock and radiates down leg  Pain description: grabs me and almost takes my breath, it comes and goes, worse in the AM Aggravating factors: not anything really  Relieving factors: eases up the more I move around  PRECAUTIONS: None  WEIGHT BEARING RESTRICTIONS: No  FALLS:  Has patient fallen in last 6 months? No  LIVING ENVIRONMENT: Lives with: lives with their spouse Lives in: House/apartment   OCCUPATION: Retired  PLOF: Independent  PATIENT GOALS: to get back to my life as it was working out and walking and gardening   NEXT MD VISIT: 08/15/22  OBJECTIVE:   DIAGNOSTIC FINDINGS:  There are five non-rib bearing lumbar-type vertebral bodies with riblets at T12. There is dextrocurvature of the lumbar spine with multilevel asymmetric disc space height loss. There is straightening of the lumbar lordosis with trace anterolisthesis of L1-2, L4-5 and L5-S1 and mild retrolisthesis of L2-3. There is no evidence for acute fracture or subluxation. Severe intervertebral disc space  height loss with reactive endplate sclerosis and subcortical cystic change at L3-4. Moderate to severe intervertebral disc space height loss at L2-3 and L4-5. Atherosclerotic calcifications.   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  PATIENT SURVEYS:  FOTO 51  SCREENING FOR RED FLAGS: Bowel or bladder incontinence: No Spinal tumors: No Cauda equina syndrome: No Compression fracture: No Abdominal aneurysm: No  COGNITION: Overall cognitive status: Within functional limits for tasks assessed     SENSATION: WFL  MUSCLE LENGTH: Hamstrings: BLE tightness  POSTURE: No Significant postural limitations  PALPATION: No remarkable findings  LUMBAR ROM:   AROM eval  Flexion 75% with pain  Extension WFL  Right lateral flexion WFL  Left lateral flexion WFL  Right rotation WFL  Left rotation WFL   (Blank rows = not tested)  LOWER EXTREMITY ROM:   grossly WFL   LOWER EXTREMITY MMT:  grossly 5/5   LUMBAR SPECIAL TESTS:  Straight leg raise test: Positive, Slump test: Negative, and FABER test: Positive  FUNCTIONAL TESTS:  5 times sit to stand: 12.82s with pain   TODAY'S TREATMENT:                                                                                                                              DATE:  08/08/22 Posture and body mechanics instruction for ADL's and gardening HEP review with focus on easy stretches and starting some core activation for stability, reviewed HEP in the water as she  will plan on returning to water based secondary to she can do more in the water than on land due to knee pain, Reviewed her MRI results with her. Feet on ball K2C, trunk rotation, bridges, isometric abs, required verbal and tactile cues to get good ab activation Passive stretch LE's  08/03/22 Bike L3.5 x 6 min Shoulder Ext 5lb 2x10 PROM/stretches of the LE's Bridges x10 SLR x10 each STM to left buttocks  Lumbar traction 55#  static  08/01/22 PROM/stretches of the LE's Education on posture body mechanics, stretching and taking breaks to decrease stress and strain on the low back STM to the left buttock Lumbar traction 55# static  07/26/22 Trunk flex/ext black t-band 2x10 Bike L 2.5 6 mins Resisted gait 40lb each way x5 n L hip ext/abd/add 5# 2x12 Step ups and down 6 in steps holding blue ball x10 Mini squats holding blue ball 2x10 Lateral band walks green x4 STS green t-band around thighs 2x10 PROM LE  07/24/22 Nustep L5 6 min DN L glute/piriformis By lead PT M. Ramisa Duman Checked goals Supine bridges/K2C/lower trunk rotation with orange ball 2x10 SLR B 3 lb 2x10 PROM LE  07/19/22 Bike L3 x55mins  Resisted gait 40# 4 way x4  Hip abd/ext 5# 2x10  Step ups 6" Calf stretch 30s  Leg ext 10# 2x10  HS curls 25# 2x10  Feet on pball rotations, knees to chest, small bridges x10 Deep tissue massage to L glute and piriformis   07/17/22 NuStep L5 x31mins  Leg press 20# 2x10  SL bridges x10 SLR 2# then with abd 2x10  Deep tissue massage to L glute and piriformis STS with chest press yellow 2x10 Shoulder ext 10# 2x10 AR press 10# 2x10 Calf stretch on slant 30s    07/12/22 Walked outdoors Black t-band flex/ext 2x10 Rows and lat pull downs 25lbs 2x10 Squats with ohp and rotation with yellow ball 2x10 Supine bridges with orange ball 1x10; caused 5/10 pain so dropped the ball and did 10 with knees bent Supine B knee side to sides 2x10 Supine PROM to hips and B LEs    07/10/22 Walk outdoors  Bike L3 x60mins  Calf stretch 30s Rows and Lats 25# 2x10 Ab roll ups 2x12 Deadbugs 2x10 STM to L glutes./piriformis with tennis ball and gun  Passive stretching lower extremities- HS, piriformis, glutes, DKTC    07/05/22 Walk outdoors around back building  Leg ext 10# 2x10 HS curls 25# 2x10 Calf stretch 30s  Calf raises 2x12  blackTB flexion 2x10  NuStep L6 x69mins AR press 10# 2x10  Leg press 30# 2x10   STM to low back and glutes with gun    07/03/22 NuStep L5 x54mins  Passive stretching HS, Piriformis, ITB, Single K2C, Double K2C, lower trunk rotations, glutes laying on MH Feet on ball pall rotations and knees to chest x10 Bridges 2x10 Clamshells green 2x10 STS with yellow ball OHP 2x10  Rows and lat pull downs 20# 2x10 Black TB ext 2x10   06/28/22 Bike L4 x 6 min   MHP to lumbar spine  Lower trunk rotations  LE on Pball bridges, oblq, K2C  Passive stretching HS, Piriformis, ITB, Single K2C, Double K2C, lower trunk rotations, glute STM to L glute and lumbar area with thera gun   06/26/22 Bike L5 x 6 min S2S OHP red ball 2x10 Rows & Lats 20lb 2x10 Bridges x10 LE on Pball bridges, oblq, K2C Passive stretching HS, Piriformis, TB, Single K2C, Double K2C, lower trunk rotaions  06/20/22- EVAL    PATIENT EDUCATION:  Education details: POC and HEP Person educated: Patient Education method: Explanation Education comprehension: verbalized understanding  HOME EXERCISE PROGRAM: Access Code: [email or web address] URL: [email or web address] Date: 06/20/2022 Prepared by: Cassie Freer  Exercises - Supine Lower Trunk Rotation  - 1 x daily - 7 x weekly - 2 sets - 10 reps - Supine Bridge  - 1 x daily - 7 x weekly - 2 sets - 10 reps - Supine Piriformis Stretch with Foot on Ground  - 1 x daily - 7 x weekly - 2 sets - 30 hold - Supine Figure 4 Piriformis Stretch  - 1 x daily - 7 x weekly - 2 sets - 30 hold - Supine Single Knee to Chest Stretch  - 1 x daily - 7 x weekly - 2 sets - 30 hold - Seated Hamstring Stretch  - 1 x daily - 7 x weekly - 2 sets - 30 hold  ASSESSMENT:  CLINICAL IMPRESSION: Discussed a lot of the HEP, pool exercise program, HEP with ball at home.  Posture and body mechanics for ADL's and garden.  Reviewed MRI.  She has not had much change in pain recently and the traction seems to cause an increase of pain.  She is feeling much better since the start but again no  changes recently, we decided to hold PT until after MD on 8/2  OBJECTIVE IMPAIRMENTS: decreased ROM and pain.   ACTIVITY LIMITATIONS: bending, squatting, and locomotion level  PARTICIPATION LIMITATIONS: community activity and yard work  [facility] POTENTIAL: Good  CLINICAL DECISION MAKING: Stable/uncomplicated  EVALUATION COMPLEXITY: Low   GOALS: Goals reviewed with patient? Yes  SHORT TERM GOALS: Target date: 07/18/22  Patient will be independent with initial HEP.  Goal status: Met 06/28/22  2.  Patient will report centralization of radicular symptoms.  Baseline: radiates down LLE, does not go past knee Goal status: MET 07/10/22  3. Patient will complete 5xSTS <12s without pain  Baseline: 12.82s with pain in LB Goal status: INITIAL; MET 9 sec 07/24/22  LONG TERM GOALS: Target date: 08/15/22  Patient will be independent with advanced/ongoing HEP to improve outcomes and carryover.  Goal status: MET 07/24/22  2.  Patient will report 75% improvement in low back and hip pain to improve QOL.  Baseline: 4/10 Goal status:; met 08/08/22  3.  Patient will demonstrate full pain free lumbar ROM to perform ADLs.   Baseline: see chart Goal status: met 08/08/22  4.  Patient will report 72 on lumbar FOTO to demonstrate improved functional ability.  Baseline: 51 Goal status: met 08/08/22  5.  Patient will be able to return to her normal exercises routine and gardening without pain Baseline: resistance training 3x/wk, yoga 2x a wk, walking every day Goal status:progressing 07/24/22   PLAN:  PT FREQUENCY: 2x/week  PT DURATION: 8 weeks  PLANNED INTERVENTIONS: Therapeutic exercises, Therapeutic activity, Neuromuscular re-education, Balance training, Gait training, Patient/Family education, Self Care, Joint mobilization, Dry Needling, Electrical stimulation, Spinal mobilization, Cryotherapy, Moist heat, Traction, Ultrasound, and Manual therapy.  PLAN FOR NEXT SESSION: will hold until after MD  08/18/22     Jearld Lesch, PT,  08/08/2022, 9:19 AM

## 2022-08-10 ENCOUNTER — Ambulatory Visit: Payer: Medicare Other | Admitting: Physical Therapy

## 2022-08-14 ENCOUNTER — Ambulatory Visit: Payer: Medicare Other | Admitting: Physical Therapy

## 2022-08-15 ENCOUNTER — Ambulatory Visit: Payer: Medicare Other

## 2022-08-15 ENCOUNTER — Telehealth: Payer: Self-pay

## 2022-08-15 DIAGNOSIS — M810 Age-related osteoporosis without current pathological fracture: Secondary | ICD-10-CM | POA: Diagnosis not present

## 2022-08-15 DIAGNOSIS — M170 Bilateral primary osteoarthritis of knee: Secondary | ICD-10-CM

## 2022-08-15 MED ORDER — DENOSUMAB 60 MG/ML ~~LOC~~ SOSY
60.0000 mg | PREFILLED_SYRINGE | Freq: Once | SUBCUTANEOUS | Status: AC
Start: 2022-08-15 — End: 2022-08-15
  Administered 2022-08-15: 60 mg via SUBCUTANEOUS

## 2022-08-15 NOTE — Telephone Encounter (Signed)
Spoke with patient during her prolia visit and a pop up box populated asking "Is your patient apart of the REMS program?". Patient stated she wasn't sure what it was. I advised her that I would look into it and give her a call regarding what it was. I left a voice message advising pt about the REMS program that I found on the prolia website. I will send the information via mychart as well.   What is the Prolia REMS? A REMS (Risk Evaluation and Mitigation Strategy) is a program required by the Food and Drug Administration to manage known or potential serious risks associated with a drug product.  The purpose of the Prolia REMS is to inform healthcare providers and patients about the following serious risk of:  Severe Hypocalcemia in Patients with Advanced Kidney Disease The Prolia REMS program materials are designed to inform healthcare providers and patients about this risk with Prolia. The Prolia REMS program materials include a REMS Letter for Healthcare Providers and a Patient Guide. It is important that you discuss with each patient the information included in the Patient Guide.

## 2022-08-15 NOTE — Progress Notes (Addendum)
Patient presents for prolia injection. Checked with up front staff and no copay or balance was due. Injection placed SubQ in back of right arm region. Patient tolerated injection well with no concerns.  Pt will return in 6 months for her next Prolia injection.   Medication was patient supplied.  [id number] UXL:[id number] EXP:07/15/2024

## 2022-08-16 ENCOUNTER — Ambulatory Visit: Payer: Medicare Other | Admitting: Physical Therapy

## 2022-08-17 ENCOUNTER — Encounter: Payer: Self-pay | Admitting: Sports Medicine

## 2022-08-18 ENCOUNTER — Other Ambulatory Visit (INDEPENDENT_AMBULATORY_CARE_PROVIDER_SITE_OTHER): Payer: Medicare Other

## 2022-08-18 ENCOUNTER — Ambulatory Visit: Payer: Medicare Other | Admitting: Orthopedic Surgery

## 2022-08-18 ENCOUNTER — Encounter: Payer: Self-pay | Admitting: Orthopedic Surgery

## 2022-08-18 VITALS — BP 136/79 | HR 81 | Ht 65.5 in | Wt 153.0 lb

## 2022-08-18 DIAGNOSIS — M545 Low back pain, unspecified: Secondary | ICD-10-CM

## 2022-08-18 NOTE — Progress Notes (Signed)
Orthopedic Spine Surgery Office Note  Assessment: Patient is a 77 y.o. female with left-sided radiculopathy.  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 Discussed that the conservative treatments include:  -activity modification  -physical therapy  -over the counter pain medications  -medrol dosepak  -lumbar steroid injections -Patient has tried PT, oral steroids, intramuscular steroid injection -Since she is getting better, recommended that she continue with PT and a home exercise program.  I told her that we could add Lyrica or try a left-sided transforaminal injection as a next steps if pain does not continue get better -Briefly mentioned a L4/5 laminectomy as a treatment option if she does not get better with the conservative treatments proposed above -Patient should return to office in 6 weeks, x-rays at next visit: None   Patient expressed understanding of the plan and all questions were answered to the patient's satisfaction.   ___________________________________________________________________________   History:  Patient is a 77 y.o. female who presents today for lumbar spine.  Patient has had about 3 months of left leg pain.  She feels the pain in the left buttock and the left posterior lateral aspect of the thigh.  It goes into the proximal aspect of the left leg.  She is having no symptoms in the right leg.  There is no trauma or injury that preceded the onset of pain.  Pain has gotten better recently with physical therapy and the oral steroids.   Weakness: Denies Symptoms of imbalance: Denies Paresthesias and numbness: Denies Bowel or bladder incontinence: Denies Saddle anesthesia: Denies  Treatments tried: PT, oral steroids, intramuscular steroid  Review of systems: Denies fevers and  chills, night sweats, unexplained weight loss, history of cancer.  Has had pain that wakes her at night  Past medical history: Osteoporosis, treated with Reclast Osteoarthritis  Allergies: Penicillin, codeine, sulfa  Past surgical history:  Hernia repair Knee surgery Polypectomy  Social history: Denies use of nicotine product (smoking, vaping, patches, smokeless) Alcohol use: Denies Denies recreational drug use   Physical Exam:  BMI of 25.1  General: no acute distress, appears stated age Neurologic: alert, answering questions appropriately, following commands Respiratory: unlabored breathing on room air, symmetric chest rise Psychiatric: appropriate affect, normal cadence to speech   MSK (spine):  -Strength exam      Left  Right EHL    5/5  5/5 TA    5/5  5/5 GSC    5/5  5/5 Knee extension  5/5  5/5 Hip flexion   5/5  5/5  -Sensory exam    Sensation intact to light touch in L3-S1 nerve distributions of bilateral lower extremities  -Achilles DTR: 2/4 on the left, 2/4 on the right -Patellar tendon DTR: 2/4 on the left, 2/4 on the right  -Straight leg raise: Negative bilaterally -Femoral nerve stretch test: Negative bilaterally -Clonus: no beats bilaterally  -Left hip exam: No pain through range of motion, negative Stinchfield, negative FABER -Right hip exam: No pain through range of motion, negative Stinchfield, negative FABER  Imaging: XR of the lumbar spine from 08/18/2022 was independently reviewed and interpreted, 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  Disc height loss at multiple levels in the lumbar spine.  No fracture or dislocation seen.  No evidence of instability on flexion/extension views.  MRI of the lumbar spine (in [place]) from 07/25/2022 was independently reviewed and interpreted,  showing spondylolisthesis at L4/5.  Central lateral recess stenosis at L4/5 with bilateral foraminal stenosis.  No other significant stenosis seen.  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   Patient name: Charlotte Brooks Patient MRN: [id number] Date of visit: 08/18/22

## 2022-08-23 MED ORDER — DENOSUMAB 60 MG/ML ~~LOC~~ SOSY
60.0000 mg | PREFILLED_SYRINGE | Freq: Once | SUBCUTANEOUS | Status: AC
Start: 2022-08-23 — End: 2022-08-15
  Administered 2022-08-15: 60 mg via SUBCUTANEOUS

## 2022-08-23 NOTE — Addendum Note (Signed)
Addended by: Larey Dresser on: 08/23/2022 01:27 PM   Modules accepted: Orders

## 2022-08-29 ENCOUNTER — Other Ambulatory Visit: Payer: Self-pay

## 2022-08-29 ENCOUNTER — Encounter: Payer: Self-pay | Admitting: Nurse Practitioner

## 2022-08-29 LAB — HM MAMMOGRAPHY

## 2022-09-26 ENCOUNTER — Telehealth: Payer: Self-pay | Admitting: Orthopedic Surgery

## 2022-09-26 NOTE — Telephone Encounter (Signed)
Called pt 1X and left vm that Dr Christell Constant will not be in office on 9/16 and we need to reschedule pt. Please reschedule pt at Dr Christell Constant first available appt date

## 2022-10-02 ENCOUNTER — Ambulatory Visit: Payer: Medicare Other | Admitting: Orthopedic Surgery

## 2022-10-26 ENCOUNTER — Ambulatory Visit: Payer: Medicare Other | Admitting: Orthopedic Surgery

## 2022-10-26 DIAGNOSIS — M5416 Radiculopathy, lumbar region: Secondary | ICD-10-CM

## 2022-10-26 NOTE — Progress Notes (Signed)
Orthopedic Spine Surgery Office Note   Assessment: Patient is a 77 y.o. female with left-sided radiculopathy.  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     Plan: -Patient has tried PT, oral steroids, intramuscular steroid injection -She should continue with the home exercise program and light stretching -Recommended diagnostic/therapeutic left L4/5 transforaminal injection -Talked about Lyrica as a potential additional nonoperative treatment if she would like to try that -We again briefly talked about a L4/5 laminectomy as a treatment option if she does not get better with the conservative treatments proposed above -Patient should return to office on an as-needed basis      Patient expressed understanding of the plan and all questions were answered to the patient's satisfaction.    ___________________________________________________________________________     History:   Patient is a 77 y.o. female who presents today for follow up on her lumbar spine.  Patient is still having low back pain that radiates into the left leg.  She feels it in the left buttock and left posterior and lateral aspect of the thigh.  It radiates as far as the proximal aspect of the left leg but does not radiate further.  She is not having any symptoms of pain radiating into the right lower extremity.  She rates the pain as an 8 of 10 at its worst.  Most of the time the pain is more tolerable.  The pain has limited her in her ability to do things that she enjoys.   Treatments tried: PT, oral steroids, intramuscular steroid     Physical Exam:   General: no acute distress, appears stated age Neurologic: alert, answering questions appropriately, following commands Respiratory: unlabored breathing on room air, symmetric chest rise Psychiatric: appropriate affect, normal cadence to speech     MSK (spine):   -Strength exam                                                   Left                   Right EHL                              5/5                  5/5 TA                                 5/5                  5/5 GSC                             5/5                  5/5 Knee extension            5/5                  5/5 Hip flexion                    5/5  5/5   -Sensory exam                           Sensation intact to light touch in L3-S1 nerve distributions of bilateral lower extremities     Imaging: XR of the lumbar spine from 08/18/2022 was previously  independently reviewed and interpreted, 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  Disc height loss at multiple levels in the lumbar spine.  No fracture or dislocation seen.  No evidence of instability on flexion/extension views.   MRI of the lumbar spine (in [place]) from 07/25/2022 was previously independently reviewed and interpreted, showing spondylolisthesis at L4/5.  Central lateral recess stenosis at L4/5 with bilateral foraminal stenosis.  No other significant stenosis seen.  DDD at multiple levels in the lumbar spine.     Patient name: Charlotte Brooks Patient MRN: [id number] Date of visit: 10/26/22

## 2022-11-02 ENCOUNTER — Telehealth: Payer: Self-pay | Admitting: Physical Medicine and Rehabilitation

## 2022-11-02 NOTE — Telephone Encounter (Signed)
Patient called returning your call for an appointment. CB#[phone]

## 2022-11-03 ENCOUNTER — Other Ambulatory Visit: Payer: Self-pay | Admitting: Physical Medicine and Rehabilitation

## 2022-11-03 ENCOUNTER — Telehealth: Payer: Self-pay

## 2022-11-03 DIAGNOSIS — F411 Generalized anxiety disorder: Secondary | ICD-10-CM

## 2022-11-03 MED ORDER — DIAZEPAM 5 MG PO TABS: ORAL_TABLET | ORAL | 0 refills | Status: DC

## 2022-11-03 NOTE — Telephone Encounter (Signed)
valium 

## 2022-11-03 NOTE — Addendum Note (Signed)
Addended by: Ashok Norris on: 11/03/2022 09:38 AM   Modules accepted: Orders

## 2022-11-03 NOTE — Telephone Encounter (Signed)
Patient is scheduled for injection on 11/07/22. Needs pre procedure Valium sent to [facility]

## 2022-11-03 NOTE — Telephone Encounter (Signed)
Spoke with patient and scheduled injection for 11/07/22. Patient aware driver needed

## 2022-11-07 ENCOUNTER — Encounter: Payer: Medicare Other | Admitting: Physical Medicine and Rehabilitation

## 2022-11-08 ENCOUNTER — Ambulatory Visit: Payer: Medicare Other | Admitting: Physical Medicine and Rehabilitation

## 2022-11-08 ENCOUNTER — Other Ambulatory Visit: Payer: Self-pay

## 2022-11-08 VITALS — BP 152/75 | HR 80

## 2022-11-08 DIAGNOSIS — M5416 Radiculopathy, lumbar region: Secondary | ICD-10-CM | POA: Diagnosis not present

## 2022-11-08 MED ORDER — METHYLPREDNISOLONE ACETATE 40 MG/ML IJ SUSP
40.0000 mg | Freq: Once | INTRAMUSCULAR | Status: AC
Start: 2022-11-08 — End: 2022-11-08
  Administered 2022-11-08: 40 mg

## 2022-11-08 NOTE — Patient Instructions (Signed)

## 2022-11-08 NOTE — Progress Notes (Signed)
Functional Pain Scale - descriptive words and definitions  Moderate (4)   Constantly aware of pain, can complete ADLs with modification/sleep marginally affected at times/passive distraction is of no use, but active distraction gives some relief. Moderate range order  Average Pain  varies   +Driver, -BT, -Dye Allergies.  Lower back pain on the left side that radiates into the left leg

## 2022-11-15 NOTE — Progress Notes (Signed)
Charlotte Brooks - 77 y.o. female MRN [id number]  Date of birth: 12/10/1945  Office Visit Note: Visit Date: 11/08/2022 PCP: Anne Ng, NP Referred by: London Sheer, MD  Subjective: Chief Complaint  Patient presents with   Lower Back - Pain   HPI:  Charlotte Brooks is a 77 y.o. female who comes in today at the request of Dr. Willia Craze for planned Left L4-5 Lumbar Transforaminal epidural steroid injection with fluoroscopic guidance.  The patient has failed conservative care including home exercise, medications, time and activity modification.  This injection will be diagnostic and hopefully therapeutic.  Please see requesting physician notes for further details and justification.   ROS Otherwise per HPI.  Assessment & Plan: Visit Diagnoses:    ICD-10-CM   1. Lumbar radiculopathy  M54.16 XR C-ARM NO REPORT    Epidural Steroid injection    methylPREDNISolone acetate (DEPO-MEDROL) injection 40 mg      Plan: No additional findings.   Meds & Orders:  Meds ordered this encounter  Medications   methylPREDNISolone acetate (DEPO-MEDROL) injection 40 mg    Orders Placed This Encounter  Procedures   XR C-ARM NO REPORT   Epidural Steroid injection    Follow-up: Return for visit to requesting provider as needed.   Procedures: No procedures performed  Lumbosacral Transforaminal Epidural Steroid Injection - Sub-Pedicular Approach with Fluoroscopic Guidance  Patient: Charlotte Brooks      Date of Birth: 1945/03/30 MRN: [id number] PCP: Anne Ng, NP      Visit Date: 11/08/2022   Universal Protocol:    Date/Time: 11/08/2022  Consent Given By: the patient  Position: PRONE  Additional Comments: Vital signs were monitored before and after the procedure. Patient was prepped and draped in the usual sterile fashion. The correct patient, procedure, and site was verified.   Injection Procedure Details:   Procedure diagnoses: Lumbar radiculopathy  [M54.16]    Meds Administered:  Meds ordered this encounter  Medications   methylPREDNISolone acetate (DEPO-MEDROL) injection 40 mg    Laterality: Left  Location/Site: L4  Needle:5.0 in., 22 ga.  Short bevel or Quincke spinal needle  Needle Placement: Transforaminal  Findings:    -Comments: Excellent flow of contrast along the nerve, nerve root and into the epidural space.  Procedure Details: After squaring off the end-plates to get a true AP view, the C-arm was positioned so that an oblique view of the foramen as noted above was visualized. The target area is just inferior to the "nose of the scotty dog" or sub pedicular. The soft tissues overlying this structure were infiltrated with 2-3 ml. of 1% Lidocaine without Epinephrine.  The spinal needle was inserted toward the target using a "trajectory" view along the fluoroscope beam.  Under AP and lateral visualization, the needle was advanced so it did not puncture dura and was located close the 6 O'Clock position of the pedical in AP tracterory. Biplanar projections were used to confirm position. Aspiration was confirmed to be negative for CSF and/or blood. A 1-2 ml. volume of Isovue-250 was injected and flow of contrast was noted at each level. Radiographs were obtained for documentation purposes.   After attaining the desired flow of contrast documented above, a 0.5 to 1.0 ml test dose of 0.25% Marcaine was injected into each respective transforaminal space.  The patient was observed for 90 seconds post injection.  After no sensory deficits were reported, and normal lower extremity motor function was noted,   the above injectate  was administered so that equal amounts of the injectate were placed at each foramen (level) into the transforaminal epidural space.   Additional Comments:  The patient tolerated the procedure well Dressing: 2 x 2 sterile gauze and Band-Aid    Post-procedure details: Patient was observed during the  procedure. Post-procedure instructions were reviewed.  Patient left the clinic in stable condition.    Clinical History: No specialty comments available.     Objective:  VS:  HT:    WT:   BMI:     BP:(!) 152/75  HR:80bpm  TEMP: ( )  RESP:  Physical Exam Vitals and nursing note reviewed.  Constitutional:      General: She is not in acute distress.    Appearance: Normal appearance. She is not ill-appearing.  HENT:     Head: Normocephalic and atraumatic.     Right Ear: External ear normal.     Left Ear: External ear normal.  Eyes:     Extraocular Movements: Extraocular movements intact.  Cardiovascular:     Rate and Rhythm: Normal rate.     Pulses: Normal pulses.  Pulmonary:     Effort: Pulmonary effort is normal. No respiratory distress.  Abdominal:     General: There is no distension.     Palpations: Abdomen is soft.  Musculoskeletal:        General: Tenderness present.     Cervical back: Neck supple.     Right lower leg: No edema.     Left lower leg: No edema.     Comments: Patient has good distal strength with no pain over the greater trochanters.  No clonus or focal weakness.  Skin:    Findings: No erythema, lesion or rash.  Neurological:     General: No focal deficit present.     Mental Status: She is alert and oriented to person, place, and time.     Sensory: No sensory deficit.     Motor: No weakness or abnormal muscle tone.     Coordination: Coordination normal.  Psychiatric:        Mood and Affect: Mood normal.        Behavior: Behavior normal.      Imaging: No results found.

## 2022-11-15 NOTE — Procedures (Signed)
Lumbosacral Transforaminal Epidural Steroid Injection - Sub-Pedicular Approach with Fluoroscopic Guidance  Patient: Charlotte Brooks      Date of Birth: 07/19/1945 MRN: [id number] PCP: Anne Ng, NP      Visit Date: 11/08/2022   Universal Protocol:    Date/Time: 11/08/2022  Consent Given By: the patient  Position: PRONE  Additional Comments: Vital signs were monitored before and after the procedure. Patient was prepped and draped in the usual sterile fashion. The correct patient, procedure, and site was verified.   Injection Procedure Details:   Procedure diagnoses: Lumbar radiculopathy [M54.16]    Meds Administered:  Meds ordered this encounter  Medications   methylPREDNISolone acetate (DEPO-MEDROL) injection 40 mg    Laterality: Left  Location/Site: L4  Needle:5.0 in., 22 ga.  Short bevel or Quincke spinal needle  Needle Placement: Transforaminal  Findings:    -Comments: Excellent flow of contrast along the nerve, nerve root and into the epidural space.  Procedure Details: After squaring off the end-plates to get a true AP view, the C-arm was positioned so that an oblique view of the foramen as noted above was visualized. The target area is just inferior to the "nose of the scotty dog" or sub pedicular. The soft tissues overlying this structure were infiltrated with 2-3 ml. of 1% Lidocaine without Epinephrine.  The spinal needle was inserted toward the target using a "trajectory" view along the fluoroscope beam.  Under AP and lateral visualization, the needle was advanced so it did not puncture dura and was located close the 6 O'Clock position of the pedical in AP tracterory. Biplanar projections were used to confirm position. Aspiration was confirmed to be negative for CSF and/or blood. A 1-2 ml. volume of Isovue-250 was injected and flow of contrast was noted at each level. Radiographs were obtained for documentation purposes.   After attaining the desired  flow of contrast documented above, a 0.5 to 1.0 ml test dose of 0.25% Marcaine was injected into each respective transforaminal space.  The patient was observed for 90 seconds post injection.  After no sensory deficits were reported, and normal lower extremity motor function was noted,   the above injectate was administered so that equal amounts of the injectate were placed at each foramen (level) into the transforaminal epidural space.   Additional Comments:  The patient tolerated the procedure well Dressing: 2 x 2 sterile gauze and Band-Aid    Post-procedure details: Patient was observed during the procedure. Post-procedure instructions were reviewed.  Patient left the clinic in stable condition.

## 2022-11-19 ENCOUNTER — Other Ambulatory Visit: Payer: Self-pay | Admitting: Medical Genetics

## 2022-11-19 DIAGNOSIS — Z006 Encounter for examination for normal comparison and control in clinical research program: Secondary | ICD-10-CM

## 2023-02-19 ENCOUNTER — Encounter: Payer: Self-pay | Admitting: Nurse Practitioner

## 2023-02-19 NOTE — Telephone Encounter (Signed)
Copied from CRM [id number]. Topic: Appointments - Appointment Cancel/Reschedule >> Feb 19, 2023 12:32 PM Gurney Maxin H wrote: Patient is calling to reschedule Prolia injection appointment that's scheduled for 03/20/2023, patient states she wants to come in sooner to have injection.  Sander [phone]

## 2023-02-19 NOTE — Telephone Encounter (Signed)
Pt was scheduled 03/20/23 for [place] to receive prolia injection. Appt cancelled and not able to be RS at this time as we do not have patient's prolia in the office. Sending message to PA team for  new PA to be initiated.

## 2023-02-20 ENCOUNTER — Ambulatory Visit: Payer: Medicare Other

## 2023-02-20 ENCOUNTER — Other Ambulatory Visit: Payer: Self-pay

## 2023-02-20 ENCOUNTER — Encounter (HOSPITAL_COMMUNITY): Payer: Self-pay

## 2023-02-20 ENCOUNTER — Telehealth: Payer: Self-pay

## 2023-02-20 ENCOUNTER — Other Ambulatory Visit (HOSPITAL_COMMUNITY): Payer: Self-pay

## 2023-02-20 DIAGNOSIS — M810 Age-related osteoporosis without current pathological fracture: Secondary | ICD-10-CM

## 2023-02-20 MED ORDER — DENOSUMAB 60 MG/ML ~~LOC~~ SOSY
60.0000 mg | PREFILLED_SYRINGE | Freq: Once | SUBCUTANEOUS | 0 refills | Status: DC
  Filled 2023-02-20: qty 1, 1d supply, fill #0
  Filled 2023-02-21: qty 1, 180d supply, fill #0

## 2023-02-20 NOTE — Telephone Encounter (Signed)
 Pt ready for scheduling for Prolia  on or after : 02/20/23  Out-of-pocket cost due at time of visit: $200  Number of injection/visits approved: 2  Primary: [employer] Prolia  co-insurance: 100% after deductible met Admin fee co-insurance: 100%  Secondary: N/A Prolia  co-insurance:  Admin fee co-insurance:   Medical Benefit Details: Date Benefits were checked: 02/20/23 Deductible: $200/ Coinsurance: 100%/ Admin Fee: 100%  Prior Auth: Approved  PA# [id number] Expiration Date: 02/20/23 to 02/20/24  # of doses approved: 2  Pharmacy benefit: Copay $150 If patient wants fill through the pharmacy benefit please send prescription to: [facility], and include estimated need by date in rx notes. Pharmacy will ship medication directly to the office.  Patient not eligible for Prolia  Copay Card. Copay Card can make patient's cost as little as $25. Link to apply: [email or web address]://[email or web address]  ** This summary of benefits is an estimation of the patient's out-of-pocket cost. Exact cost may very based on individual plan coverage.

## 2023-02-20 NOTE — Progress Notes (Unsigned)
See 02/19/23 TE for all additional correspondence.

## 2023-02-20 NOTE — Telephone Encounter (Signed)
 Marland Kitchen

## 2023-02-20 NOTE — Telephone Encounter (Signed)
Pharmacy Patient Advocate Encounter   Received notification from  [employer] Portal that prior authorization for Prolia is required/requested.   Insurance verification completed.   The patient is insured through  [employer]  .   Per test claim: PA required; PA submitted to above mentioned insurance via [facility] portal Key/confirmation #/EOC [id number] Status is pending

## 2023-02-20 NOTE — Telephone Encounter (Signed)
Pharmacy Patient Advocate Encounter  Received notification from  [employer]  that Prior Authorization for Prolia has been APPROVED from 02/20/23 to 02/20/24 for 2 injections   PA #/Case ID/Reference #: [id number]

## 2023-02-20 NOTE — Addendum Note (Signed)
Addended by: Larey Dresser on: 02/20/2023 03:55 PM   Modules accepted: Orders

## 2023-02-21 ENCOUNTER — Other Ambulatory Visit (HOSPITAL_COMMUNITY): Payer: Self-pay

## 2023-02-21 ENCOUNTER — Other Ambulatory Visit: Payer: Self-pay

## 2023-02-21 NOTE — Progress Notes (Signed)
 Specialty Pharmacy Initial Fill Coordination Note  Charlotte Brooks is a 78 y.o. female contacted today regarding initial fill of specialty medication(s) Denosumab  (PROLIA )   Patient requested Courier to Provider Office   Delivery date: 02/26/23   Verified address: [facility] [address]   Medication will be filled on 2/6.   Patient is aware of $150 copayment.

## 2023-02-21 NOTE — Progress Notes (Signed)
 Pharmacy Patient Advocate Encounter  Insurance verification completed.   The patient is insured through [employer] test claim for Prolia . Currently a quantity of 1ml is a 180 day supply and the co-pay is $150.   This test claim was processed through [facility]- copay amounts may vary at other pharmacies due to pharmacy/plan contracts, or as the patient moves through the different stages of their insurance plan.

## 2023-02-22 ENCOUNTER — Other Ambulatory Visit (HOSPITAL_COMMUNITY): Payer: Self-pay

## 2023-03-01 ENCOUNTER — Ambulatory Visit: Payer: Medicare Other

## 2023-03-01 DIAGNOSIS — M810 Age-related osteoporosis without current pathological fracture: Secondary | ICD-10-CM | POA: Diagnosis not present

## 2023-03-01 DIAGNOSIS — M170 Bilateral primary osteoarthritis of knee: Secondary | ICD-10-CM

## 2023-03-01 MED ORDER — DENOSUMAB 60 MG/ML ~~LOC~~ SOSY: 60.0000 mg | PREFILLED_SYRINGE | Freq: Once | SUBCUTANEOUS | Status: DC

## 2023-03-01 MED ORDER — DENOSUMAB 60 MG/ML ~~LOC~~ SOSY
60.0000 mg | PREFILLED_SYRINGE | Freq: Once | SUBCUTANEOUS | Status: AC
Start: 2023-08-29 — End: 2023-03-01
  Administered 2023-03-01: 60 mg via SUBCUTANEOUS

## 2023-03-01 NOTE — Progress Notes (Signed)
Per orders of Alysia Penna, FNP, injection of Prolia given RT Arm SQ by Mickle Plumb, cma.  Patient tolerated injection well.  She will reschedule for 08/29/23.  Dm/cma  Lot #  [id number] Exp 08/15/2025 NDC # 59563-875-64

## 2023-03-20 ENCOUNTER — Ambulatory Visit: Payer: Medicare Other

## 2023-04-09 ENCOUNTER — Ambulatory Visit (INDEPENDENT_AMBULATORY_CARE_PROVIDER_SITE_OTHER): Payer: Medicare Other | Admitting: Nurse Practitioner

## 2023-04-09 ENCOUNTER — Encounter: Payer: Self-pay | Admitting: Nurse Practitioner

## 2023-04-09 VITALS — BP 134/72 | HR 70 | Temp 97.7°F | Ht 65.0 in | Wt 154.8 lb

## 2023-04-09 DIAGNOSIS — M810 Age-related osteoporosis without current pathological fracture: Secondary | ICD-10-CM | POA: Diagnosis not present

## 2023-04-09 DIAGNOSIS — Z13220 Encounter for screening for lipoid disorders: Secondary | ICD-10-CM

## 2023-04-09 DIAGNOSIS — M51362 Other intervertebral disc degeneration, lumbar region with discogenic back pain and lower extremity pain: Secondary | ICD-10-CM | POA: Diagnosis not present

## 2023-04-09 DIAGNOSIS — Z136 Encounter for screening for cardiovascular disorders: Secondary | ICD-10-CM

## 2023-04-09 DIAGNOSIS — G8929 Other chronic pain: Secondary | ICD-10-CM

## 2023-04-09 DIAGNOSIS — Z0001 Encounter for general adult medical examination with abnormal findings: Secondary | ICD-10-CM | POA: Diagnosis not present

## 2023-04-09 DIAGNOSIS — M545 Low back pain, unspecified: Secondary | ICD-10-CM

## 2023-04-09 DIAGNOSIS — F5104 Psychophysiologic insomnia: Secondary | ICD-10-CM | POA: Diagnosis not present

## 2023-04-09 LAB — COMPREHENSIVE METABOLIC PANEL
ALT: 19 U/L (ref 0–35)
AST: 29 U/L (ref 0–37)
Albumin: 4.3 g/dL (ref 3.5–5.2)
Alkaline Phosphatase: 105 U/L (ref 39–117)
BUN: 19 mg/dL (ref 6–23)
CO2: 25 meq/L (ref 19–32)
Calcium: 9.6 mg/dL (ref 8.4–10.5)
Chloride: 104 meq/L (ref 96–112)
Creatinine, Ser: 0.82 mg/dL (ref 0.40–1.20)
GFR: 68.82 mL/min (ref >60.00)
Glucose, Bld: 87 mg/dL (ref 70–99)
Potassium: 4.1 meq/L (ref 3.5–5.1)
Sodium: 139 meq/L (ref 135–145)
Total Bilirubin: 0.5 mg/dL (ref 0.2–1.2)
Total Protein: 7.8 g/dL (ref 6.0–8.3)

## 2023-04-09 LAB — HEMOGLOBIN A1C: Hgb A1c MFr Bld: 5.7 % (ref 4.6–6.5)

## 2023-04-09 LAB — LIPID PANEL
Cholesterol: 177 mg/dL (ref 0–200)
HDL: 43.3 mg/dL (ref >39.00)
LDL Cholesterol: 109 mg/dL — ABNORMAL HIGH (ref 0–99)
NonHDL: 133.33
Total CHOL/HDL Ratio: 4
Triglycerides: 120 mg/dL (ref 0.0–149.0)
VLDL: 24 mg/dL (ref 0.0–40.0)

## 2023-04-09 MED ORDER — MELOXICAM 7.5 MG PO TABS: ORAL_TABLET | ORAL | 3 refills | Status: DC

## 2023-04-09 NOTE — Assessment & Plan Note (Addendum)
 Use of tylenol PM at hs with improvement No improvement with melatonin

## 2023-04-09 NOTE — Progress Notes (Signed)
 Complete physical exam  Patient: Charlotte Brooks   DOB: 1945/04/13   78 y.o. Female  MRN: [id number] Visit Date: 04/09/2023  Subjective:    Chief Complaint  Patient presents with   Annual Exam   KEELIN NEVILLE is a 78 y.o. female who presents today for a complete physical exam. She reports consuming a low fat and low sodium diet. Home exercise routine includes calisthenics and stretching. She generally feels well. She reports sleeping well. She does have additional problems to discuss today.  Vision:Yes Dental:Yes STD Screen:No  BP Readings from Last 3 Encounters:  04/09/23 134/72  11/08/22 (!) 152/75  08/18/22 136/79   Wt Readings from Last 3 Encounters:  04/09/23 154 lb 12.8 oz (70.2 kg)  08/18/22 153 lb (69.4 kg)  06/06/22 153 lb (69.4 kg)   Most recent fall risk assessment:    04/09/2023    8:01 AM  Fall Risk   Falls in the past year? 0  Number falls in past yr: 0  Injury with Fall? 0  Risk for fall due to : No Fall Risks  Follow up Falls evaluation completed     Depression screen:Yes - No Depression Most recent depression screenings:    04/09/2023    8:02 AM 04/06/2022    8:20 AM  PHQ 2/9 Scores  PHQ - 2 Score 0 0  PHQ- 9 Score 2     HPI  DDD (degenerative disc disease), lumbar Followed by chipractor MRI completed by Dr. Christell Constant, steriod injections administered last year Completed PT Does not want any surgical intervention  Senile osteoporosis Reports severe constipation with calcium supplement.  Current use of 1000IU vit. D Last dexa scan 2024: osteopenia Maintain prolia injections  Chronic insomnia Use of tylenol PM at hs  Past Medical History:  Diagnosis Date   Arthritis    Constipation    Osteoporosis    Gets Reclast   Past Surgical History:  Procedure Laterality Date   COLONOSCOPY     HERNIA REPAIR  2/08   KNEE ARTHROPLASTY     lateral release    KNEE SURGERY     POLYPECTOMY     Social History   Socioeconomic History    Marital status: Married    Spouse name: Dorinda Hill   Number of children: 4   Years of education: Not on file   Highest education level: 12th grade  Occupational History   Occupation: retired  Tobacco Use   Smoking status: Never   Smokeless tobacco: Never  Vaping Use   Vaping status: Never Used  Substance and Sexual Activity   Alcohol use: No    Comment: OCC WINE1-2 yearly   Drug use: Never   Sexual activity: Yes    Partners: Male  Other Topics Concern   Not on file  Social History Narrative   Exercise-- water aerobics, yoga, machines   Social Drivers of Health   Financial Resource Strain: Low Risk  (04/05/2023)   Overall Financial Resource Strain (CARDIA)    Difficulty of Paying Living Expenses: Not very hard  Food Insecurity: No Food Insecurity (04/05/2023)   Hunger Vital Sign    Worried About Running Out of Food in the Last Year: Never true    Ran Out of Food in the Last Year: Never true  Transportation Needs: No Transportation Needs (04/05/2023)   PRAPARE - Administrator, Civil Service (Medical): No    Lack of Transportation (Non-Medical): No  Physical Activity: Sufficiently Active (04/05/2023)  Exercise Vital Sign    Days of Exercise per Week: 6 days    Minutes of Exercise per Session: 60 min  Stress: Stress Concern Present (04/05/2023)   [employer] of Occupational Health - Occupational Stress Questionnaire    Feeling of Stress : To some extent  Social Connections: Moderately Integrated (04/05/2023)   Social Connection and Isolation Panel [NHANES]    Frequency of Communication with Friends and Family: Twice a week    Frequency of Social Gatherings with Friends and Family: Once a week    Attends Religious Services: More than 4 times per year    Active Member of [employer] or Organizations: No    Attends Engineer, structural: Not on file    Marital Status: Married  Intimate Partner Violence: Unknown (07/12/2022)   Received from [employer]   HITS    Physically Hurt: Not on file    Insult or Talk Down To: Not on file    Threaten Physical Harm: Not on file    Scream or Curse: Not on file   Family Status  Relation Name Status   Mother  Deceased at age 17   Father  Deceased at age 17       MVA   Brother  Deceased   Other  (Not Specified)   MGM  (Not Specified)   MGF  (Not Specified)   Neg Hx  (Not Specified)  No partnership data on file   Family History  Problem Relation Age of Onset   Stroke Mother    Hypertension Mother    Diabetes Brother    Heart disease Brother    Coronary artery disease Other    Diabetes Maternal Grandmother    Diabetes Maternal Grandfather    Colon cancer Neg Hx    Esophageal cancer Neg Hx    Pancreatic cancer Neg Hx    Rectal cancer Neg Hx    Stomach cancer Neg Hx    Colon polyps Neg Hx    Allergies  Allergen Reactions   Penicillins Anaphylaxis and Swelling    Has patient had a PCN reaction causing immediate rash, facial/tongue/throat swelling, SOB or lightheadedness with hypotension: Unknown Has patient had a PCN reaction causing severe rash involving mucus membranes or skin necrosis: Unknown Has patient had a PCN reaction that required hospitalization Unknown Has patient had a PCN reaction occurring within the last 10 years: No  If all of the above answers are "NO", then may proceed with Cephalosporin use.    Codeine Nausea And Vomiting   Sulfonamide Derivatives Rash    Patient Care Team: Girard Koontz, Bonna Gains, NP as PCP - General (Internal Medicine) Napoleon Form, MD as Consulting Physician (Gastroenterology) Adelfa Koh, MD as Consulting Physician (Orthopedic Surgery) Belva Chimes, MD as Consulting Physician (Ophthalmology) Lorella Nimrod, DDS as Consulting Physician (Dentistry) Ollen Gross, MD as Consulting Physician (Orthopedic Surgery)   Medications: Outpatient Medications Prior to Visit  Medication Sig   Biotin 5000 MCG CAPS Take 2  capsules by mouth daily.    Cholecalciferol (VITAMIN D PO) Take 1,000 Units by mouth daily.   denosumab (PROLIA) 60 MG/ML SOLN injection Inject 60 mg into the skin every 6 (six) months. Administer in upper arm, thigh, or abdomen   diazepam (VALIUM) 5 MG tablet Take 1 by mouth 1 hour  pre-procedure with very light food. May bring 2nd tablet to appointment.   diclofenac Sodium (VOLTAREN) 1 % GEL Apply 2 g topically 3 (three) times  daily as needed.   Misc Natural Products (TART CHERRY ADVANCED PO) Take by mouth.   Omega-3 Fatty Acids (FISH OIL) 1000 MG CAPS Take by mouth.   OVER THE COUNTER MEDICATION Swiss Kriss herbal laxative 2-4 daily   polyethylene glycol powder (GLYCOLAX/MIRALAX) powder USING MEASURING CAP MIX  17GM IN WATER AND DRINK  EVERY MORNING   vitamin C (ASCORBIC ACID) 500 MG tablet Take 1,000 mg by mouth daily.    Wheat Dextrin (BENEFIBER PO) Take by mouth. Two tablespoons twice a day   [DISCONTINUED] meloxicam (MOBIC) 7.5 MG tablet TAKE 1 TO 2 TABLETS BY MOUTH  ONCE DAILY WITH FOOD AS NEEDED  FOR PAIN   Facility-Administered Medications Prior to Visit  Medication Dose Route Frequency Provider   denosumab (PROLIA) injection 60 mg  60 mg Subcutaneous Once Rahi Chandonnet, Bonna Gains, NP    Review of Systems  Constitutional:  Negative for activity change, appetite change and unexpected weight change.  Respiratory: Negative.    Cardiovascular: Negative.   Gastrointestinal:  Positive for constipation. Negative for abdominal distention, abdominal pain, blood in stool, diarrhea, nausea, rectal pain and vomiting.  Endocrine: Negative for cold intolerance and heat intolerance.  Genitourinary: Negative.   Musculoskeletal: Negative.   Skin: Negative.   Neurological: Negative.   Hematological: Negative.   Psychiatric/Behavioral:  Positive for sleep disturbance. Negative for behavioral problems, decreased concentration, dysphoric mood, hallucinations, self-injury and suicidal ideas. The patient  is not nervous/anxious.        Objective:  BP 134/72 (BP Location: Left Arm, Patient Position: Sitting, Cuff Size: Normal)   Pulse 70   Temp 97.7 F (36.5 C) (Temporal)   Ht 5\' 5"  (1.651 m)   Wt 154 lb 12.8 oz (70.2 kg)   SpO2 96%   BMI 25.76 kg/m     Physical Exam Vitals and nursing note reviewed.  Constitutional:      General: She is not in acute distress. HENT:     Right Ear: Tympanic membrane, ear canal and external ear normal.     Left Ear: Tympanic membrane, ear canal and external ear normal.     Nose: Nose normal.  Eyes:     Extraocular Movements: Extraocular movements intact.     Conjunctiva/sclera: Conjunctivae normal.     Pupils: Pupils are equal, round, and reactive to light.  Neck:     Thyroid: No thyroid mass, thyromegaly or thyroid tenderness.  Cardiovascular:     Rate and Rhythm: Normal rate and regular rhythm.     Pulses: Normal pulses.     Heart sounds: Normal heart sounds.  Pulmonary:     Effort: Pulmonary effort is normal.     Breath sounds: Normal breath sounds.  Abdominal:     General: Bowel sounds are normal.     Palpations: Abdomen is soft.     Tenderness: There is no left CVA tenderness.  Musculoskeletal:        General: Normal range of motion.     Cervical back: Normal range of motion and neck supple.     Right lower leg: No edema.     Left lower leg: No edema.  Lymphadenopathy:     Cervical: No cervical adenopathy.  Skin:    General: Skin is warm and dry.  Neurological:     Mental Status: She is alert and oriented to person, place, and time.     Cranial Nerves: No cranial nerve deficit.  Psychiatric:        Mood and Affect: Mood normal.  Behavior: Behavior normal.        Thought Content: Thought content normal.      No results found for any visits on 04/09/23.    Assessment & Plan:    Routine Health Maintenance and Physical Exam  Immunization History  Administered Date(s) Administered   Moderna Covid-19 Fall Seasonal  Vaccine 34yrs & older 10/14/2021, 09/22/2022   Moderna Sars-Covid-2 Vaccination 03/03/2019, 04/01/2019, 12/19/2019   Zoster Recombinant(Shingrix) 10/16/2016, 04/07/2017   Zoster, Live 05/01/2007    Health Maintenance  Topic Date Due   DTaP/Tdap/Td (1 - Tdap) Never done   Pneumonia Vaccine 62+ Years old (1 of 1 - PCV) Never done   Medicare Annual Wellness (AWV)  07/29/2020   INFLUENZA VACCINE  Never done   Colonoscopy  11/05/2022   COVID-19 Vaccine (6 - Moderna risk 2024-25 season) 03/22/2023   MAMMOGRAM  08/28/2023   DEXA SCAN  05/18/2024   Hepatitis C Screening  Completed   Zoster Vaccines- Shingrix  Completed   HPV VACCINES  Aged Out    Discussed health benefits of physical activity, and encouraged her to engage in regular exercise appropriate for her age and condition.  Problem List Items Addressed This Visit     Chronic insomnia   Use of tylenol PM at hs      DDD (degenerative disc disease), lumbar - Primary   Followed by chipractor MRI completed by Dr. Christell Constant, steriod injections administered last year Completed PT Does not want any surgical intervention      Relevant Medications   meloxicam (MOBIC) 7.5 MG tablet   Senile osteoporosis   Reports severe constipation with calcium supplement.  Current use of 1000IU vit. D Last dexa scan 2024: osteopenia Maintain prolia injections      Other Visit Diagnoses       Chronic bilateral low back pain without sciatica       Relevant Medications   meloxicam (MOBIC) 7.5 MG tablet     Encounter for preventative adult health care exam with abnormal findings       Relevant Orders   Hemoglobin A1c   Comprehensive metabolic panel     Encounter for lipid screening for cardiovascular disease       Relevant Orders   Lipid panel      Return in about 1 year (around 04/08/2024) for CPE (fasting).     Alysia Penna, NP

## 2023-04-09 NOTE — Assessment & Plan Note (Addendum)
 Followed by chipractor MRI completed by Dr. Christell Constant, steriod injections administered last year Completed PT Does not want any surgical intervention

## 2023-04-09 NOTE — Patient Instructions (Addendum)
 Go to lab Maintain Heart healthy diet and daily exercise. Maintain current medications.  May use magnesium glycinate (calm) or tylenol PM for sleep

## 2023-04-09 NOTE — Assessment & Plan Note (Addendum)
 Reports severe constipation with calcium supplement.  Current use of 1000IU vit. D Last dexa scan 2024: osteopenia Maintain prolia injections

## 2023-04-10 ENCOUNTER — Encounter: Payer: Self-pay | Admitting: Nurse Practitioner

## 2023-05-02 ENCOUNTER — Encounter: Payer: Self-pay | Admitting: Nurse Practitioner

## 2023-05-02 ENCOUNTER — Other Ambulatory Visit (INDEPENDENT_AMBULATORY_CARE_PROVIDER_SITE_OTHER)

## 2023-05-02 ENCOUNTER — Ambulatory Visit: Admitting: Orthopedic Surgery

## 2023-05-02 DIAGNOSIS — M5416 Radiculopathy, lumbar region: Secondary | ICD-10-CM

## 2023-05-02 DIAGNOSIS — M170 Bilateral primary osteoarthritis of knee: Secondary | ICD-10-CM | POA: Diagnosis not present

## 2023-05-02 NOTE — Progress Notes (Signed)
 Orthopedic Spine Surgery Office Note   Assessment: Patient is a 78 y.o. female with low back pain that radiates into bilateral lower extremities. Feels it in the buttocks and posterior thighs. 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 After our discussion, patient elected to proceed.  -Patient will next be seen at the date of surgery     The patient has symptoms consistent with lumbar radiculopathy. The patient's symptoms were not getting improvement with conservative treatment so operative management was discussed in the form of L4/5 laminectomy. We briefly discussed laminectomy and fusion given her spondylolisthesis, but she is not having significant back pain and her spondylolisthesis is stable so will proceed with decompression along. The risks including but not limited to iatrogenic instability, dural tear, nerve root injury, paralysis, persistent pain, infection, bleeding, heart attack, death, dvt/pe, fracture, and need for additional procedures were discussed with the patient. The benefit of the surgery would be improvement in the patient's radiating leg pain. I explained that back pain relief is not the goal of the surgery and it is not reliably alleviated with this surgery. The alternatives to surgical management were covered with the patient and included continued monitoring, physical therapy, over-the-counter pain medications, ambulatory aids, injections, and activity modification. All the patient's questions were answered to her and her daughter's satisfaction. After this discussion, the patient expressed understanding and elected to proceed with surgical intervention.      ___________________________________________________________________________      History:   Patient is a 78 y.o. female who presents today for her lumbar spine. Patient has had over 1 year of low back pain that radiates into her bilateral lower extremities. Pain is felt in the low back, bilateral buttock, and bilateral posterior thighs. She feels the pain with activity and at rest. The majority of her pain is in her legs. She feels 80% of the pain is in her legs. Pain has gotten progressively worse with time. She has tried multiple conservative treatments but has not noticed any relief with them. Has not noticed any weakness in her legs. No paresthesias or numbness. No bowel or bladder incontinence. Denies saddle anesthesia.    Treatments tried: PT, tylenol, aleve, oral steroids, intramuscular steroid injection, lumbar steroid injection     Review of systems: Denies fevers and chills, night sweats, unexplained weight loss, history of cancer.  Has had pain that wakes her at night  Past medical history: Osteoporosis, treated with Reclast Osteoarthritis   Allergies: Penicillin, codeine, sulfa   Past surgical history:  Hernia repair Knee surgery Polypectomy  Social history: Denies use of nicotine product (smoking, vaping, patches, smokeless) Alcohol use: Denies Denies recreational drug use   Physical Exam:   BMI of 25.1  General: no acute distress, appears stated age Neurologic: alert, answering questions appropriately, following commands Respiratory: unlabored breathing on room air, symmetric chest rise Psychiatric: appropriate affect, normal cadence to speech     MSK (spine):   -Strength exam                                                   Left  Right EHL                              5/5                  5/5 TA                                 5/5                  5/5 GSC                             5/5                  5/5 Knee extension            5/5                  5/5 Hip flexion                    5/5                  5/5    -Sensory exam                           Sensation intact to light touch in L2-S1 nerve distributions of bilateral lower extremities     Imaging: XRs of the lumbar spine from 05/02/2023 were previously  independently reviewed and interpreted, showing disc height loss at L2/3, L3/4, and L4/5. Spondylolisthesis at L4/5. Spondylolisthesis shifts about 1mm between the supine MRI and standing lateral. No fracture or dislocation seen. Lumbar degenerative scoliosis with apex to the right that measures 12 degrees.    MRI of the lumbar spine (in [place]) from 07/25/2022 was previously independently reviewed and interpreted, showing spondylolisthesis at L4/5.  Central lateral recess stenosis at L4/5 with bilateral foraminal stenosis.  No other significant stenosis seen.  DDD at multiple levels in the lumbar spine.     Patient name: Charlotte Brooks Patient MRN: [id number] Date of visit: 05/02/23  Pre-operative Scores  ODI: 44% VAS back: 2/10 VAS leg: 10/10

## 2023-05-16 ENCOUNTER — Ambulatory Visit (INDEPENDENT_AMBULATORY_CARE_PROVIDER_SITE_OTHER)

## 2023-05-16 VITALS — Temp 97.4°F

## 2023-05-16 DIAGNOSIS — Z01818 Encounter for other preprocedural examination: Secondary | ICD-10-CM | POA: Diagnosis not present

## 2023-05-16 NOTE — Progress Notes (Signed)
 Patient is here for EKG for preoperative clearance.

## 2023-05-17 ENCOUNTER — Telehealth: Payer: Self-pay

## 2023-05-17 NOTE — Telephone Encounter (Signed)
 Faxed pre operative consultation with copy of EKG to [facility] at [place] to the attention of April.

## 2023-05-29 ENCOUNTER — Encounter (HOSPITAL_COMMUNITY): Payer: Self-pay

## 2023-05-29 NOTE — Progress Notes (Signed)
 PCP - Kathrene Parents, NO Cardiologist - none (Clearance on 05/17/23 for surgery placed on chart)   Chest x-ray - n/a EKG - 05/16/23 (copy on chart) Stress Test - n/a ECHO - n/a Cardiac Cath - n/a  ICD Pacemaker/Loop - n/a  Sleep Study -  n/a  Diabetes - n/a  Aspirin & Blood Thinner Instructions:  n/a  ERAS - clear liquids til 4:30 AM DOS PRE-SURGERY Ensure given with instructions at PAT  Anesthesia review: no  STOP now taking any Aspirin (unless otherwise instructed by your surgeon), Aleve, Naproxen, Ibuprofen, Motrin, Advil, Mobic , Voltaren , Goody's, BC's, all herbal medications, fish oil, and all vitamins.   Coronavirus Screening Do you have any of the following symptoms:  Cough yes/no: No Fever (>100.70F)  yes/no: No Runny nose yes/no: No Sore throat yes/no: No Difficulty breathing/shortness of breath  yes/no: No  Have you traveled in the last 14 days and where? yes/no: No  Patient verbalized understanding of instructions that were given to them at the PAT appointment. Patient was also instructed that they will need to review over the PAT instructions again at home before surgery.

## 2023-05-29 NOTE — Progress Notes (Signed)
 Surgical Instructions   Your procedure is scheduled on Tuesday, May 27th, 2025. Report to [facility] Main Entrance "A" at 5:30 A.M., then check in with the Admitting office. Any questions or running late day of surgery: call [phone]  Questions prior to your surgery date: call [phone], Monday-Friday, 8am-4pm. If you experience any cold or flu symptoms such as cough, fever, chills, shortness of breath, etc. between now and your scheduled surgery, please notify us  at the above number.     Remember:  Do not eat after midnight the night before your surgery  You may drink clear liquids until 4:30 the morning of your surgery.   Clear liquids allowed are: Water, Non-Citrus Juices (without pulp), Carbonated Beverages, Clear Tea (no milk, honey, etc.), Black Coffee Only (NO MILK, CREAM OR POWDERED CREAMER of any kind), and Gatorade.  Patient Instructions  The night before surgery:  No food after midnight. ONLY clear liquids after midnight  The day of surgery (if you do NOT have diabetes):  Drink ONE (1) Pre-Surgery Clear Ensure by 4:30 the morning of surgery. Drink in one sitting. Do not sip.  This drink was given to you during your hospital  pre-op appointment visit.  Nothing else to drink after completing the  Pre-Surgery Clear Ensure.          If you have questions, please contact your surgeon's office.     Take these medicines the morning of surgery with A SIP OF WATER: None.   May take these medicines IF NEEDED: None.     One week prior to surgery, STOP taking any Aspirin (unless otherwise instructed by your surgeon) Aleve, Naproxen, Ibuprofen, Motrin, Advil, Goody's, BC's, all herbal medications, fish oil, and non-prescription vitamins.  This includes Meloxicam  (Mobic ).                     Do NOT Smoke (Tobacco/Vaping) for 24 hours prior to your procedure.  If you use a CPAP at night, you may bring your mask/headgear for your overnight stay.   You will be asked  to remove any contacts, glasses, piercing's, hearing aid's, dentures/partials prior to surgery. Please bring cases for these items if needed.    Patients discharged the day of surgery will not be allowed to drive home, and someone needs to stay with them for 24 hours.  SURGICAL WAITING ROOM VISITATION Patients may have no more than 2 support people in the waiting area - these visitors may rotate.   Pre-op nurse will coordinate an appropriate time for 1 ADULT support person, who may not rotate, to accompany patient in pre-op.  Children under the age of 41 must have an adult with them who is not the patient and must remain in the main waiting area with an adult.  If the patient needs to stay at the hospital during part of their recovery, the visitor guidelines for inpatient rooms apply.  Please refer to the [facility] website for the visitor guidelines for any additional information.   If you received a COVID test during your pre-op visit  it is requested that you wear a mask when out in public, stay away from anyone that may not be feeling well and notify your surgeon if you develop symptoms. If you have been in contact with anyone that has tested positive in the last 10 days please notify you surgeon.      Pre-operative 5 CHG Bathing Instructions   You can play a key role in reducing the  risk of infection after surgery. Your skin needs to be as free of germs as possible. You can reduce the number of germs on your skin by washing with CHG (chlorhexidine gluconate) soap before surgery. CHG is an antiseptic soap that kills germs and continues to kill germs even after washing.   DO NOT use if you have an allergy to chlorhexidine/CHG or antibacterial soaps. If your skin becomes reddened or irritated, stop using the CHG and notify one of our RNs at [phone].   Please shower with the CHG soap starting 4 days before surgery using the following schedule:     Please keep in mind the  following:  DO NOT shave, including legs and underarms, starting the day of your first shower.   You may shave your face at any point before/day of surgery.  Place clean sheets on your bed the day you start using CHG soap. Use a clean washcloth (not used since being washed) for each shower. DO NOT sleep with pets once you start using the CHG.   CHG Shower Instructions:  Wash your face and private area with normal soap. If you choose to wash your hair, wash first with your normal shampoo.  After you use shampoo/soap, rinse your hair and body thoroughly to remove shampoo/soap residue.  Turn the water OFF and apply about 3 tablespoons (45 ml) of CHG soap to a CLEAN washcloth.  Apply CHG soap ONLY FROM YOUR NECK DOWN TO YOUR TOES (washing for 3-5 minutes)  DO NOT use CHG soap on face, private areas, open wounds, or sores.  Pay special attention to the area where your surgery is being performed.  If you are having back surgery, having someone wash your back for you may be helpful. Wait 2 minutes after CHG soap is applied, then you may rinse off the CHG soap.  Pat dry with a clean towel  Put on clean clothes/pajamas   If you choose to wear lotion, please use ONLY the CHG-compatible lotions that are listed below.  Additional instructions for the day of surgery: DO NOT APPLY any lotions, deodorants, cologne, or perfumes.   Do not bring valuables to the hospital. [facility] is not responsible for any belongings/valuables. Do not wear nail polish, gel polish, artificial nails, or any other type of covering on natural nails (fingers and toes) Do not wear jewelry or makeup Put on clean/comfortable clothes.  Please brush your teeth.  Ask your nurse before applying any prescription medications to the skin.     CHG Compatible Lotions   Aveeno Moisturizing lotion  Cetaphil Moisturizing Cream  Cetaphil Moisturizing Lotion  Clairol Herbal Essence Moisturizing Lotion, Dry Skin  Clairol Herbal  Essence Moisturizing Lotion, Extra Dry Skin  Clairol Herbal Essence Moisturizing Lotion, Normal Skin  Curel Age Defying Therapeutic Moisturizing Lotion with Alpha Hydroxy  Curel Extreme Care Body Lotion  Curel Soothing Hands Moisturizing Hand Lotion  Curel Therapeutic Moisturizing Cream, Fragrance-Free  Curel Therapeutic Moisturizing Lotion, Fragrance-Free  Curel Therapeutic Moisturizing Lotion, Original Formula  Eucerin Daily Replenishing Lotion  Eucerin Dry Skin Therapy Plus Alpha Hydroxy Crme  Eucerin Dry Skin Therapy Plus Alpha Hydroxy Lotion  Eucerin Original Crme  Eucerin Original Lotion  Eucerin Plus Crme Eucerin Plus Lotion  Eucerin TriLipid Replenishing Lotion  Keri Anti-Bacterial Hand Lotion  Keri Deep Conditioning Original Lotion Dry Skin Formula Softly Scented  Keri Deep Conditioning Original Lotion, Fragrance Free Sensitive Skin Formula  Keri Lotion Fast Absorbing Fragrance Free Sensitive Skin Formula  Keri Lotion Fast  Absorbing Softly Scented Dry Skin Formula  Keri Original Lotion  Keri Skin Renewal Lotion Keri Silky Smooth Lotion  Keri Silky Smooth Sensitive Skin Lotion  Nivea Body Creamy Conditioning Oil  Nivea Body Extra Enriched Lotion  Nivea Body Original Lotion  Nivea Body Sheer Moisturizing Lotion Nivea Crme  Nivea Skin Firming Lotion  NutraDerm 30 Skin Lotion  NutraDerm Skin Lotion  NutraDerm Therapeutic Skin Cream  NutraDerm Therapeutic Skin Lotion  ProShield Protective Hand Cream  Provon moisturizing lotion  Please read over the following fact sheets that you were given.

## 2023-05-30 ENCOUNTER — Ambulatory Visit: Admitting: Orthopaedic Surgery

## 2023-05-30 ENCOUNTER — Other Ambulatory Visit (INDEPENDENT_AMBULATORY_CARE_PROVIDER_SITE_OTHER): Payer: Self-pay

## 2023-05-30 ENCOUNTER — Other Ambulatory Visit: Payer: Self-pay

## 2023-05-30 ENCOUNTER — Encounter (HOSPITAL_COMMUNITY)
Admission: RE | Admit: 2023-05-30 | Discharge: 2023-05-30 | Disposition: A | Discharge status: Home / Self Care | Source: Ambulatory Visit | Attending: Orthopedic Surgery | Admitting: Orthopedic Surgery

## 2023-05-30 ENCOUNTER — Encounter (HOSPITAL_COMMUNITY): Payer: Self-pay

## 2023-05-30 VITALS — BP 140/63 | HR 78 | Temp 98.0°F | Resp 16 | Ht 65.0 in | Wt 152.9 lb

## 2023-05-30 DIAGNOSIS — Z01818 Encounter for other preprocedural examination: Secondary | ICD-10-CM | POA: Diagnosis present

## 2023-05-30 DIAGNOSIS — G8929 Other chronic pain: Secondary | ICD-10-CM

## 2023-05-30 DIAGNOSIS — M25562 Pain in left knee: Secondary | ICD-10-CM

## 2023-05-30 DIAGNOSIS — M25561 Pain in right knee: Secondary | ICD-10-CM | POA: Diagnosis not present

## 2023-05-30 DIAGNOSIS — Z01812 Encounter for preprocedural laboratory examination: Secondary | ICD-10-CM | POA: Insufficient documentation

## 2023-05-30 HISTORY — DX: Unspecified cataract: H26.9

## 2023-05-30 LAB — CBC
HCT: 40.8 % (ref 36.0–46.0)
Hemoglobin: 13.2 g/dL (ref 12.0–15.0)
MCH: 29.9 pg (ref 26.0–34.0)
MCHC: 32.4 g/dL (ref 30.0–36.0)
MCV: 92.3 fL (ref 80.0–100.0)
Platelets: 245 10*3/uL (ref 150–400)
RBC: 4.42 MIL/uL (ref 3.87–5.11)
RDW: 12.7 % (ref 11.5–15.5)
WBC: 7.1 10*3/uL (ref 4.0–10.5)
nRBC: 0 % (ref 0.0–0.2)

## 2023-05-30 LAB — SURGICAL PCR SCREEN

## 2023-05-30 LAB — BASIC METABOLIC PANEL WITH GFR
Anion gap: 7 (ref 5–15)
BUN: 20 mg/dL (ref 8–23)
CO2: 27 mmol/L (ref 22–32)
Calcium: 9.7 mg/dL (ref 8.9–10.3)
Chloride: 105 mmol/L (ref 98–111)
Creatinine, Ser: 0.85 mg/dL (ref 0.44–1.00)
GFR, Estimated: >60 mL/min (ref >60)
Glucose, Bld: 98 mg/dL (ref 70–99)
Potassium: 4.1 mmol/L (ref 3.5–5.1)
Sodium: 139 mmol/L (ref 135–145)

## 2023-05-30 NOTE — Progress Notes (Signed)
 The patient is a 78 year old active female that I am seeing for the first time.  She is also being followed by my partner Dr. Sulema Endo who was performing lumbar spine surgery in 2 weeks on her spine.  She has chronic bilateral knee pain with known mild to moderate arthritis.  Hyaluronic acid injections have always helped her.  She has seen another practice in town for a long period time but is difficult to get in with that surgeon so she would like to be established with another Investment banker, operational.  She said questioning the exercises and activities have helped her knees feel better.  She would like to be considered again for hyaluronic acid injections in the future.  Examination of both knees shows neutral alignment.  There is no swelling of either knee today with excellent range of motion.  There is a little bit of medial joint line tenderness and patellofemoral crepitation but no instability on exam.  X-rays of both knees show mild arthritic changes mainly at the patellofemoral joint with neutral alignment.  I do believe that she is an appropriate candidate for continuing hyaluronic acid injections.  We will hold off on any type of injection until after she has her lumbar spine surgery so we can see symptomatically how she does from that surgery.  Will see her back in 6 weeks and then can go from there in terms of determining which injections may be most appropriate for her knees.  No x-rays will be needed.

## 2023-05-31 NOTE — Progress Notes (Signed)
 Surgical PCR collected at PAT appointment had invalid result. Will need to be recollected DOS. Order placed

## 2023-06-11 NOTE — H&P (Signed)
 Orthopedic Spine Surgery H&P Note  Assessment: Patient is a 78 y.o. female with lumbar radiculopathy   Plan: -Out of bed as tolerated, activity as tolerated, no brace -Covered the risks of surgery one more time with the patient and patient elected to proceed with planned surgery -Written consent verified -Hold anticoagulation in anticipation of surgery -Ancef and TXA on all to OR -NPO for procedure -Site marked -To OR when ready  The risks of surgery covered this morning included but were not limited to: iatrogenic instability, dural tear, nerve root injury, paralysis, persistent pain, infection, bleeding, heart attack, death, stroke, fracture, dvt/pe, and need for additional procedures were discussed with the patient. The patient's questions were answered to her satisfaction. After this discussion, the patient expressed understanding and elected to proceed with surgical intervention.     ___________________________________________________________________________  Chief Complaint: low back and bilateral leg pain   History: Patient is 78 y.o. female who has been previously seen in the office for low back pain that radiates into her bilateral lower extremities. Her symptoms were consistent with lumbar radiculopathy. Her symptoms failed to improve with conservative treatment so operative management was discussed at the last office visit. The patient presents today with no changes in her symptoms since the last office visit. See previous office note for further details.    Review of systems: General: denies fevers and chills, myalgias Neurologic: denies recent changes in vision, slurred speech Abdomen: denies nausea, vomiting, hematemesis Respiratory: denies cough, shortness of breath  Past medical history: Osteoporosis (treated with reclast ) Osteoarthritis   Allergies: penicillin, codeine, sulfa   Past surgical history:  Hernia repair Knee surgery Polypectomy   Social  history: Denies use of nicotine product (smoking, vaping, patches, smokeless) Alcohol use: Denies Denies recreational drug use  Family history: -reviewed and not pertinent to lumbar radiculopathy   Physical Exam:  General: no acute distress, appears stated age Neurologic: alert, answering questions appropriately, following commands Cardiovascular: regular rate, no cyanosis Respiratory: unlabored breathing on room air, symmetric chest rise Psychiatric: appropriate affect, normal cadence to speech   MSK (spine):  -Strength exam      Left  Right  EHL    5/5  5/5 TA    5/5  5/5 GSC    5/5  5/5 Knee extension  5/5  5/5 Knee flexion   5/5  5/5 Hip flexion   5/5  5/5  -Sensory exam    Sensation intact to light touch in L2-S1 nerve distributions of bilateral lower extremities   Patient name: Charlotte Brooks Patient MRN: [id number] Date: 06/12/2023

## 2023-06-11 NOTE — Anesthesia Preprocedure Evaluation (Signed)
 Anesthesia Evaluation  Patient identified by MRN, date of birth, ID band Patient awake    Reviewed: Allergy & Precautions, NPO status , Patient's Chart, lab work & pertinent test results  Airway Mallampati: II  TM Distance: >3 FB Neck ROM: Full    Dental  (+) Dental Advisory Given, Teeth Intact   Pulmonary neg pulmonary ROS   Pulmonary exam normal breath sounds clear to auscultation       Cardiovascular + Peripheral Vascular Disease  Normal cardiovascular exam Rhythm:Regular Rate:Normal     Neuro/Psych negative neurological ROS     GI/Hepatic negative GI ROS, Neg liver ROS,,,  Endo/Other  negative endocrine ROS    Renal/GU negative Renal ROS     Musculoskeletal  (+) Arthritis ,    Abdominal   Peds  Hematology negative hematology ROS (+)   Anesthesia Other Findings   Reproductive/Obstetrics                             Anesthesia Physical Anesthesia Plan  ASA: 2  Anesthesia Plan: General   Post-op Pain Management: Tylenol  PO (pre-op)*   Induction: Intravenous  PONV Risk Score and Plan: 4 or greater and Ondansetron , Dexamethasone and Treatment may vary due to age or medical condition  Airway Management Planned: Oral ETT  Additional Equipment:   Intra-op Plan:   Post-operative Plan: Extubation in OR  Informed Consent: I have reviewed the patients History and Physical, chart, labs and discussed the procedure including the risks, benefits and alternatives for the proposed anesthesia with the patient or authorized representative who has indicated his/her understanding and acceptance.     Dental advisory given  Plan Discussed with: CRNA  Anesthesia Plan Comments:        Anesthesia Quick Evaluation

## 2023-06-12 ENCOUNTER — Other Ambulatory Visit: Payer: Self-pay

## 2023-06-12 ENCOUNTER — Encounter (HOSPITAL_COMMUNITY)
Admission: RE | Disposition: A | Discharge status: Home / Self Care | Payer: Self-pay | Source: Home / Self Care | Attending: Orthopedic Surgery

## 2023-06-12 ENCOUNTER — Ambulatory Visit (HOSPITAL_COMMUNITY): Admitting: Anesthesiology

## 2023-06-12 ENCOUNTER — Ambulatory Visit (HOSPITAL_COMMUNITY)

## 2023-06-12 ENCOUNTER — Observation Stay (HOSPITAL_COMMUNITY)
Admission: RE | Admit: 2023-06-12 | Discharge: 2023-06-13 | Disposition: A | Discharge status: Home / Self Care | Attending: Orthopedic Surgery | Admitting: Orthopedic Surgery

## 2023-06-12 DIAGNOSIS — M48061 Spinal stenosis, lumbar region without neurogenic claudication: Secondary | ICD-10-CM | POA: Diagnosis present

## 2023-06-12 DIAGNOSIS — M5416 Radiculopathy, lumbar region: Secondary | ICD-10-CM

## 2023-06-12 DIAGNOSIS — Z01818 Encounter for other preprocedural examination: Secondary | ICD-10-CM

## 2023-06-12 HISTORY — PX: DECOMPRESSIVE LUMBAR LAMINECTOMY LEVEL 1: SHX5791

## 2023-06-12 LAB — SURGICAL PCR SCREEN
MRSA, PCR: NEGATIVE
Staphylococcus aureus: NEGATIVE

## 2023-06-12 SURGERY — DECOMPRESSIVE LUMBAR LAMINECTOMY LEVEL 1
Anesthesia: General

## 2023-06-12 MED ORDER — HYDROMORPHONE HCL 1 MG/ML IJ SOLN
INTRAMUSCULAR | Status: AC
  Filled 2023-06-12: qty 0.5

## 2023-06-12 MED ORDER — POVIDONE-IODINE 10 % EX SWAB
2.0000 | Freq: Once | CUTANEOUS | Status: AC
  Administered 2023-06-12: 2 via TOPICAL

## 2023-06-12 MED ORDER — MIDAZOLAM HCL 2 MG/2ML IJ SOLN
INTRAMUSCULAR | Status: DC | PRN
  Administered 2023-06-12: 1 mg via INTRAVENOUS

## 2023-06-12 MED ORDER — BUPIVACAINE-EPINEPHRINE (PF) 0.25% -1:200000 IJ SOLN
INTRAMUSCULAR | Status: AC
  Filled 2023-06-12: qty 30

## 2023-06-12 MED ORDER — ACETAMINOPHEN 500 MG PO TABS
1000.0000 mg | ORAL_TABLET | Freq: Three times a day (TID) | ORAL | Status: DC
Start: 2023-06-12 — End: 2023-06-13
  Administered 2023-06-12 – 2023-06-13 (×2): 1000 mg via ORAL
  Filled 2023-06-12 (×2): qty 2

## 2023-06-12 MED ORDER — DEXAMETHASONE SODIUM PHOSPHATE 10 MG/ML IJ SOLN
10.0000 mg | Freq: Once | INTRAMUSCULAR | Status: AC
  Administered 2023-06-12: 10 mg via INTRAVENOUS
  Filled 2023-06-12: qty 1

## 2023-06-12 MED ORDER — ONDANSETRON HCL 4 MG PO TABS: 4.0000 mg | ORAL_TABLET | Freq: Four times a day (QID) | ORAL | Status: DC | PRN

## 2023-06-12 MED ORDER — DROPERIDOL 2.5 MG/ML IJ SOLN: 0.6250 mg | Freq: Once | INTRAMUSCULAR | Status: DC | PRN

## 2023-06-12 MED ORDER — TRANEXAMIC ACID-NACL 1000-0.7 MG/100ML-% IV SOLN
INTRAVENOUS | Status: AC
  Filled 2023-06-12: qty 100

## 2023-06-12 MED ORDER — VANCOMYCIN HCL 1000 MG IV SOLR
INTRAVENOUS | Status: AC
  Filled 2023-06-12: qty 20

## 2023-06-12 MED ORDER — BUPIVACAINE-EPINEPHRINE (PF) 0.25% -1:200000 IJ SOLN
INTRAMUSCULAR | Status: DC | PRN
  Administered 2023-06-12: 30 mL

## 2023-06-12 MED ORDER — HYDROMORPHONE HCL 1 MG/ML IJ SOLN
INTRAMUSCULAR | Status: AC
Start: 2023-06-12 — End: ?
  Filled 2023-06-12: qty 1

## 2023-06-12 MED ORDER — LIDOCAINE 2% (20 MG/ML) 5 ML SYRINGE
INTRAMUSCULAR | Status: DC | PRN
  Administered 2023-06-12: 60 mg via INTRAVENOUS

## 2023-06-12 MED ORDER — PHENYLEPHRINE HCL-NACL 20-0.9 MG/250ML-% IV SOLN
INTRAVENOUS | Status: DC | PRN
  Administered 2023-06-12: 15 ug/min via INTRAVENOUS

## 2023-06-12 MED ORDER — EPHEDRINE SULFATE-NACL 50-0.9 MG/10ML-% IV SOSY
PREFILLED_SYRINGE | INTRAVENOUS | Status: DC | PRN
  Administered 2023-06-12 (×2): 5 mg via INTRAVENOUS

## 2023-06-12 MED ORDER — ONDANSETRON HCL 4 MG/2ML IJ SOLN
INTRAMUSCULAR | Status: DC | PRN
  Administered 2023-06-12: 4 mg via INTRAVENOUS

## 2023-06-12 MED ORDER — TRANEXAMIC ACID-NACL 1000-0.7 MG/100ML-% IV SOLN
1000.0000 mg | Freq: Once | INTRAVENOUS | Status: AC
  Administered 2023-06-12: 1000 mg via INTRAVENOUS
  Filled 2023-06-12: qty 100

## 2023-06-12 MED ORDER — MIDAZOLAM HCL 2 MG/2ML IJ SOLN
INTRAMUSCULAR | Status: AC
  Filled 2023-06-12: qty 2

## 2023-06-12 MED ORDER — THROMBIN 20000 UNITS EX SOLR
CUTANEOUS | Status: AC
  Filled 2023-06-12: qty 20000

## 2023-06-12 MED ORDER — HYDROMORPHONE HCL 1 MG/ML IJ SOLN
0.5000 mg | INTRAMUSCULAR | Status: AC | PRN
  Administered 2023-06-12: 0.5 mg via INTRAVENOUS
  Filled 2023-06-12: qty 0.5

## 2023-06-12 MED ORDER — OXYCODONE HCL 5 MG PO TABS
5.0000 mg | ORAL_TABLET | ORAL | Status: DC | PRN
  Administered 2023-06-12: 5 mg via ORAL
  Administered 2023-06-13 (×2): 10 mg via ORAL
  Filled 2023-06-12: qty 2
  Filled 2023-06-12: qty 1
  Filled 2023-06-12: qty 2

## 2023-06-12 MED ORDER — CLINDAMYCIN PHOSPHATE 600 MG/50ML IV SOLN
600.0000 mg | INTRAVENOUS | Status: AC
  Administered 2023-06-12: 600 mg via INTRAVENOUS
  Filled 2023-06-12: qty 50

## 2023-06-12 MED ORDER — CLINDAMYCIN PHOSPHATE 600 MG/50ML IV SOLN
600.0000 mg | Freq: Three times a day (TID) | INTRAVENOUS | Status: AC
  Administered 2023-06-12 – 2023-06-13 (×2): 600 mg via INTRAVENOUS
  Filled 2023-06-12 (×4): qty 50

## 2023-06-12 MED ORDER — SENNA 8.6 MG PO TABS
1.0000 | ORAL_TABLET | Freq: Two times a day (BID) | ORAL | Status: DC
  Administered 2023-06-12: 8.6 mg via ORAL
  Filled 2023-06-12: qty 1

## 2023-06-12 MED ORDER — HYDROMORPHONE HCL 1 MG/ML IJ SOLN
INTRAMUSCULAR | Status: AC
  Filled 2023-06-12: qty 1

## 2023-06-12 MED ORDER — ROCURONIUM BROMIDE 10 MG/ML (PF) SYRINGE
PREFILLED_SYRINGE | INTRAVENOUS | Status: DC | PRN
  Administered 2023-06-12: 60 mg via INTRAVENOUS
  Administered 2023-06-12: 10 mg via INTRAVENOUS
  Administered 2023-06-12: 20 mg via INTRAVENOUS
  Administered 2023-06-12: 10 mg via INTRAVENOUS

## 2023-06-12 MED ORDER — DEXAMETHASONE SODIUM PHOSPHATE 10 MG/ML IJ SOLN
INTRAMUSCULAR | Status: AC
  Filled 2023-06-12: qty 1

## 2023-06-12 MED ORDER — ONDANSETRON HCL 4 MG/2ML IJ SOLN
4.0000 mg | Freq: Four times a day (QID) | INTRAMUSCULAR | Status: DC | PRN
Start: 2023-06-12 — End: 2023-06-13
  Administered 2023-06-12 (×2): 4 mg via INTRAVENOUS
  Filled 2023-06-12 (×2): qty 2

## 2023-06-12 MED ORDER — FENTANYL CITRATE (PF) 250 MCG/5ML IJ SOLN
INTRAMUSCULAR | Status: AC
  Filled 2023-06-12: qty 5

## 2023-06-12 MED ORDER — HYDROMORPHONE HCL 1 MG/ML IJ SOLN
0.2500 mg | INTRAMUSCULAR | Status: DC | PRN
  Administered 2023-06-12 (×4): 0.5 mg via INTRAVENOUS

## 2023-06-12 MED ORDER — LIDOCAINE 2% (20 MG/ML) 5 ML SYRINGE
INTRAMUSCULAR | Status: AC
  Filled 2023-06-12: qty 5

## 2023-06-12 MED ORDER — 0.9 % SODIUM CHLORIDE (POUR BTL) OPTIME
TOPICAL | Status: DC | PRN
  Administered 2023-06-12: 1000 mL

## 2023-06-12 MED ORDER — ALBUMIN HUMAN 5 % IV SOLN: INTRAVENOUS | Status: DC | PRN

## 2023-06-12 MED ORDER — HYDROMORPHONE HCL 1 MG/ML IJ SOLN
INTRAMUSCULAR | Status: DC | PRN
  Administered 2023-06-12 (×2): .25 mg via INTRAVENOUS

## 2023-06-12 MED ORDER — PROPOFOL 10 MG/ML IV BOLUS
INTRAVENOUS | Status: AC
  Filled 2023-06-12: qty 20

## 2023-06-12 MED ORDER — CHLORHEXIDINE GLUCONATE 0.12 % MT SOLN: 15.0000 mL | Freq: Once | OROMUCOSAL | Status: DC

## 2023-06-12 MED ORDER — ONDANSETRON HCL 4 MG/2ML IJ SOLN
INTRAMUSCULAR | Status: AC
  Filled 2023-06-12: qty 2

## 2023-06-12 MED ORDER — LACTATED RINGERS IV SOLN: INTRAVENOUS | Status: DC

## 2023-06-12 MED ORDER — HYDROXYZINE HCL 50 MG/ML IM SOLN
50.0000 mg | Freq: Four times a day (QID) | INTRAMUSCULAR | Status: DC | PRN
  Administered 2023-06-12: 50 mg via INTRAMUSCULAR
  Filled 2023-06-12: qty 1

## 2023-06-12 MED ORDER — TRANEXAMIC ACID-NACL 1000-0.7 MG/100ML-% IV SOLN
1000.0000 mg | INTRAVENOUS | Status: AC
  Administered 2023-06-12: 1000 mg via INTRAVENOUS
  Filled 2023-06-12: qty 100

## 2023-06-12 MED ORDER — PROPOFOL 10 MG/ML IV BOLUS
INTRAVENOUS | Status: DC | PRN
  Administered 2023-06-12: 100 mg via INTRAVENOUS

## 2023-06-12 MED ORDER — VANCOMYCIN HCL 1000 MG IV SOLR
INTRAVENOUS | Status: DC | PRN
  Administered 2023-06-12: 1000 mg via TOPICAL

## 2023-06-12 MED ORDER — SUGAMMADEX SODIUM 200 MG/2ML IV SOLN
INTRAVENOUS | Status: DC | PRN
  Administered 2023-06-12: 200 mg via INTRAVENOUS

## 2023-06-12 MED ORDER — THROMBIN 20000 UNITS EX SOLR
CUTANEOUS | Status: DC | PRN
  Administered 2023-06-12: 20 mL via TOPICAL

## 2023-06-12 MED ORDER — ACETAMINOPHEN 500 MG PO TABS
1000.0000 mg | ORAL_TABLET | Freq: Once | ORAL | Status: AC
  Administered 2023-06-12: 1000 mg via ORAL
  Filled 2023-06-12: qty 2

## 2023-06-12 MED ORDER — FENTANYL CITRATE (PF) 250 MCG/5ML IJ SOLN
INTRAMUSCULAR | Status: DC | PRN
Start: 2023-06-12 — End: 2023-06-12
  Administered 2023-06-12: 100 ug via INTRAVENOUS
  Administered 2023-06-12: 25 ug via INTRAVENOUS

## 2023-06-12 MED ORDER — ROCURONIUM BROMIDE 10 MG/ML (PF) SYRINGE
PREFILLED_SYRINGE | INTRAVENOUS | Status: AC
  Filled 2023-06-12: qty 10

## 2023-06-12 MED ORDER — ORAL CARE MOUTH RINSE: 15.0000 mL | Freq: Once | OROMUCOSAL | Status: DC

## 2023-06-12 MED ORDER — POLYETHYLENE GLYCOL 3350 17 G PO PACK: 17.0000 g | PACK | Freq: Every day | ORAL | Status: DC

## 2023-06-12 SURGICAL SUPPLY — 43 items
BENZOIN TINCTURE AMPULE (MISCELLANEOUS) ×1 IMPLANT
BENZOIN TINCTURE PRP APPL 2/3 (GAUZE/BANDAGES/DRESSINGS) ×1 IMPLANT
BUR MATCHSTICK NEURO 3.0 LAGG (BURR) ×1 IMPLANT
CANISTER SUCTION 3000ML PPV (SUCTIONS) ×1 IMPLANT
CNTNR URN SCR LID CUP LEK RST (MISCELLANEOUS) IMPLANT
COVER MAYO STAND STRL (DRAPES) ×3 IMPLANT
COVER SURGICAL LIGHT HANDLE (MISCELLANEOUS) ×1 IMPLANT
DERMABOND ADVANCED .7 DNX12 (GAUZE/BANDAGES/DRESSINGS) IMPLANT
DRAIN HEMOVAC 7FR (DRAIN) IMPLANT
DRAPE C-ARM 42X72 X-RAY (DRAPES) ×1 IMPLANT
DRAPE UTILITY XL STRL (DRAPES) ×2 IMPLANT
DRESSING MEPILEX FLEX 4X4 (GAUZE/BANDAGES/DRESSINGS) ×1 IMPLANT
DRSG MEPILEX POST OP 4X8 (GAUZE/BANDAGES/DRESSINGS) ×1 IMPLANT
DRSG TEGADERM 4X4.75 (GAUZE/BANDAGES/DRESSINGS) ×3 IMPLANT
DURAPREP 26ML APPLICATOR (WOUND CARE) ×1 IMPLANT
ELECT PENCIL ROCKER SW 15FT (MISCELLANEOUS) ×1 IMPLANT
ELECTRODE REM PT RTRN 9FT ADLT (ELECTROSURGICAL) ×1 IMPLANT
GAUZE SPONGE 4X4 12PLY STRL (GAUZE/BANDAGES/DRESSINGS) ×1 IMPLANT
GLOVE BIO SURGEON STRL SZ7.5 (GLOVE) ×1 IMPLANT
GLOVE BIOGEL PI IND STRL 7.0 (GLOVE) IMPLANT
GLOVE BIOGEL PI IND STRL 7.5 (GLOVE) IMPLANT
GLOVE INDICATOR 7.5 STRL GRN (GLOVE) ×1 IMPLANT
GLOVE SURG SS PI 6.5 STRL IVOR (GLOVE) IMPLANT
GOWN STRL SURGICAL XL XLNG (GOWN DISPOSABLE) ×1 IMPLANT
KIT BASIN OR (CUSTOM PROCEDURE TRAY) ×1 IMPLANT
KIT POSITION SURG JACKSON T1 (MISCELLANEOUS) ×1 IMPLANT
KIT TURNOVER KIT B (KITS) ×1 IMPLANT
MARKER SKIN DUAL TIP RULER LAB (MISCELLANEOUS) IMPLANT
NS IRRIG 1000ML POUR BTL (IV SOLUTION) ×1 IMPLANT
PACK LAMINECTOMY ORTHO (CUSTOM PROCEDURE TRAY) ×1 IMPLANT
PATTIES SURGICAL .5 X.5 (GAUZE/BANDAGES/DRESSINGS) IMPLANT
SPONGE SURGIFOAM ABS GEL 100 (HEMOSTASIS) ×1 IMPLANT
SUCTION TUBE FRAZIER 10FR DISP (SUCTIONS) ×1 IMPLANT
SUT BONE WAX W31G (SUTURE) ×1 IMPLANT
SUT MNCRL AB 3-0 PS2 27 (SUTURE) ×1 IMPLANT
SUT MNCRL+ AB 3-0 CT1 36 (SUTURE) ×1 IMPLANT
SUT VIC AB 0 CT1 18XCR BRD8 (SUTURE) ×1 IMPLANT
SUT VIC AB 2-0 CT1 18 (SUTURE) ×1 IMPLANT
SUT VIC AB 2-0 CT2 18 VCP726D (SUTURE) ×1 IMPLANT
TOWEL GREEN STERILE (TOWEL DISPOSABLE) ×1 IMPLANT
TOWEL GREEN STERILE FF (TOWEL DISPOSABLE) ×1 IMPLANT
TUBING FEATHERFLOW (TUBING) ×1 IMPLANT
WATER STERILE IRR 1000ML POUR (IV SOLUTION) ×1 IMPLANT

## 2023-06-12 NOTE — Anesthesia Procedure Notes (Signed)
 Procedure Name: Intubation Date/Time: 06/12/2023 7:54 AM  Performed by: Hershall Lory, CRNAPre-anesthesia Checklist: Patient identified, Emergency Drugs available, Suction available and Patient being monitored Patient Re-evaluated:Patient Re-evaluated prior to induction Oxygen Delivery Method: Circle system utilized Preoxygenation: Pre-oxygenation with 100% oxygen Induction Type: IV induction Ventilation: Mask ventilation without difficulty Tube type: Oral Tube size: 7.0 mm Number of attempts: 1 Airway Equipment and Method: Stylet and Oral airway Placement Confirmation: ETT inserted through vocal cords under direct vision, positive ETCO2 and breath sounds checked- equal and bilateral Secured at: 22 cm Tube secured with: Tape Dental Injury: Teeth and Oropharynx as per pre-operative assessment

## 2023-06-12 NOTE — Op Note (Addendum)
 Orthopedic Spine Surgery Operative Report  Procedure: L4, L5 lumbar laminectomy  Modifier: none  Date of procedure: 06/12/2023  Patient name: Charlotte Brooks MRN: [id number] DOB: 09/11/1945  Surgeon: Colette Davies, MD Assistant: none Pre-operative diagnosis: lumbar stenosis, lumbar radiculopathy Post-operative diagnosis: same as above Findings: L4/5 hypertrophic facets and thickened ligamentum flavum, L4/5 spondylolisthesis  Specimens: none Anesthesia: general EBL: 50cc Complications: none Pre-incision antibiotic: clindamycin  TXA was given prior to incision as well  Implants: none   Indication for procedure: Patient is a 78 y.o. female who presented to the office with symptoms consistent with lumbar stenosis. The patient had tried conservative treatments that did not provide any lasting relief. As result, operative management was discussed. The pre-operative MRI showed stenosis from L4-L5 so a L4, L5 segment laminectomy with partial medial facetectomies was presented as a treatment option. The risks including but not limited to iatrogenic instability, dural tear, nerve root injury, paralysis, persistent pain, infection, bleeding, heart attack, death, stroke, fracture, blindness, and need for additional procedures were discussed with the patient. The benefit of the surgery would be relief of the patient's symptoms related to the lumbar stenosis. The alternatives to surgical management were covered with the patient and included continued monitoring, physical therapy, over-the-counter pain medications, ambulatory aids, and activity modification. All the patient's questions were answered to their satisfaction. After this discussion, the patient expressed understanding and elected to proceed with surgical intervention.   Procedure Description: The patient was met in the pre-operative holding area. The patient's identity and consent were verified. The operative site was marked. The patient's  remaining questions about the surgery were answered. The patient was brought back to the operating room. General anesthesia was induced and an endotracheal tube was placed by the anesthesia staff. The patient was transferred to the prone [place] table in the prone position. All bony prominences were well padded. The head of the bed was slightly elevated and the eyes were free from compression by the face pillow. The surgical area was cleansed with alcohol. Fluoroscopy was then brought in to check rotation on the AP image and to mark the levels on the lateral image. The patient's skin was then prepped and draped in a standard, sterile fashion. A time out was performed that identified the patient, the procedure, and the operative levels. All team members agreed with what was stated in the time out.   A midline incision over the spinous processes of the previously marked levels was made and sharp dissection was continued down through the skin and dermis. Electrocautery was then used to continue the midline dissection down to the level of the spinous process. Subperiosteal dissection was performed using electrocautery to expose the lamina out lateral to the facet joint capsule. Care was taken to not violate the facet joint capsules. A lateral fluoroscopic image was taken to confirm the level. Subperiosteal dissection with electrocautery was then done to expose the lamina and pars of L4 and the cranial aspect of the lamina of L5. Again, care was taken to avoid disruption of the facet capsules.    A rongeur was used to remove the spinous processes and interspinous ligaments between the L3/4 interspinous ligament to the cranial portion of L5 spinous process. Bone wax was used to obtain hemostasis at the bleeding bony surfaces. A high-speed burr was used to thin the lamina at L4 and the cranial aspect of L5. Above the level of the ligamentum, the lamina was thinned with the burr to the approximate level of the  ligamentum. Care was taken to leave at least 8mm of pars interarticularis on each side. A series of Kerrison rongeurs were used to remove the remaining lamina and ligamentum overlying the thecal sac. A woodsen was used to protect the thecal sac as a kerrison was used to remove the medial portion of the L4/5 facet joint bilaterally. A 2.0 curved curette was then used to remove any soft tissue and some of the bone overlying the exiting L4 nerve root on the left side.   A woodsen was placed into the laminectomy site to palpate for any remaining areas of stenosis. Once it was confirmed with the woodsen that decompression had been completed from the medial pedicle wall to the contralateral medial pedicle wall of L4 to L5, decompression was determined to be completed.  The wound was copiously irrigated with sterile saline. 1g of vancomycin powder was placed into the wound. The fascia was reapproximated with 0 vicryl suture. The subcutaneous fat was reapproximated with 0 vicryl suture. The deep dermal layer was reapproximated with 2-0 vicryl. The skin as closed with a 3-0 running monocryl. All counts were correct at the end of the case. Dermabond was applied over the skin. An island dressing was placed over the wound. The patient was transferred back to a bed and brought to the post-anesthesia care unit by anesthesia staff in stable condition.  Post-operative plan: The patient will recover in the post-anesthesia care unit and then go to the floor. The patient will receive two post-operative doses of clindamycin. The patient will be out of bed as tolerated with no brace. The patient will work with physical therapy. She will get another dose of TXA. The patient will likely discharge to home tomorrow.     Colette Davies, MD Orthopedic Surgeon

## 2023-06-12 NOTE — Plan of Care (Signed)

## 2023-06-12 NOTE — Progress Notes (Signed)
 Orthopedic Surgery Post-operative Progress Note  Assessment: Patient is a 78 y.o. female who is currently admitted after undergoing L4/5 laminectomy   Plan: -Operative plans complete -Drains: none -Out of bed as tolerated, no brace -No bending/lifting/twisting greater than 10 pounds -PT evaluate and treat -Pain control -Regular diet -No chemoprophylaxis for dvt or antiplatelets for 72 hours after surgery -Clindamycin x2 post-operative doses -Disposition: to floor from PACU  ___________________________________________________________________________   Subjective: No acute events since surgery. Recovering in PACU. Pain controlled.   Objective:  General: no acute distress, appropriate affect Neurologic: alert, answering questions appropriately, following commands Respiratory: unlabored breathing on room air Skin: dressing clear/dry/intact  MSK (spine):  -Strength exam      Right  Left  EHL    5/5  5/5 TA    5/5  5/5 GSC    5/5  5/5 Knee extension  5/5  5/5 Hip flexion   5/5  5/5  -Sensory exam    Sensation intact to light touch in L2-S1 nerve distributions of bilateral lower extremities   Patient name: Charlotte Brooks Patient MRN: [id number] Date: 06/12/23

## 2023-06-12 NOTE — Transfer of Care (Signed)
 Immediate Anesthesia Transfer of Care Note  Patient: Charlotte Brooks  Procedure(s) Performed: DECOMPRESSIVE LUMBAR LAMINECTOMY LUMBAR FOUR-FIVE LEVEL 1  Patient Location: PACU  Anesthesia Type:General  Level of Consciousness: awake  Airway & Oxygen Therapy: Patient Spontanous Breathing and Patient connected to face mask oxygen  Post-op Assessment: Report given to RN, Post -op Vital signs reviewed and stable, and Patient moving all extremities  Post vital signs: Reviewed and stable  Last Vitals:  Vitals Value Taken Time  BP 138/61 06/12/23 1107  Temp 36.6 C 06/12/23 1107  Pulse 100 06/12/23 1111  Resp 14 06/12/23 1111  SpO2 98 % 06/12/23 1111  Vitals shown include unfiled device data.  Last Pain:  Vitals:   06/12/23 0650  TempSrc:   PainSc: 6       Patients Stated Pain Goal: 3 (06/12/23 0650)  Complications: No notable events documented.

## 2023-06-12 NOTE — Anesthesia Postprocedure Evaluation (Signed)
 Anesthesia Post Note  Patient: Charlotte Brooks  Procedure(s) Performed: DECOMPRESSIVE LUMBAR LAMINECTOMY LUMBAR FOUR-FIVE LEVEL 1     Patient location during evaluation: PACU Anesthesia Type: General Level of consciousness: awake and alert Pain management: pain level controlled Vital Signs Assessment: post-procedure vital signs reviewed and stable Respiratory status: spontaneous breathing, nonlabored ventilation, respiratory function stable and patient connected to nasal cannula oxygen Cardiovascular status: blood pressure returned to baseline and stable Postop Assessment: no apparent nausea or vomiting Anesthetic complications: no   No notable events documented.  Last Vitals:  Vitals:   06/12/23 1215 06/12/23 1248  BP: 121/65 (!) 136/57  Pulse: 92 97  Resp: 15 18  Temp: 36.4 C 36.5 C  SpO2: 100% 96%    Last Pain:  Vitals:   06/12/23 1215  TempSrc:   PainSc: 3    Pain Goal: Patients Stated Pain Goal: 3 (06/12/23 0650)                 Leslye Rast

## 2023-06-13 ENCOUNTER — Other Ambulatory Visit (HOSPITAL_COMMUNITY): Payer: Self-pay

## 2023-06-13 ENCOUNTER — Encounter (HOSPITAL_COMMUNITY): Payer: Self-pay | Admitting: Orthopedic Surgery

## 2023-06-13 DIAGNOSIS — M48061 Spinal stenosis, lumbar region without neurogenic claudication: Secondary | ICD-10-CM | POA: Diagnosis not present

## 2023-06-13 LAB — BASIC METABOLIC PANEL WITH GFR
Anion gap: 8 (ref 5–15)
BUN: 12 mg/dL (ref 8–23)
CO2: 25 mmol/L (ref 22–32)
Calcium: 8.6 mg/dL — ABNORMAL LOW (ref 8.9–10.3)
Chloride: 104 mmol/L (ref 98–111)
Creatinine, Ser: 1.02 mg/dL — ABNORMAL HIGH (ref 0.44–1.00)
GFR, Estimated: 57 mL/min — ABNORMAL LOW (ref >60)
Glucose, Bld: 136 mg/dL — ABNORMAL HIGH (ref 70–99)
Potassium: 3.6 mmol/L (ref 3.5–5.1)
Sodium: 137 mmol/L (ref 135–145)

## 2023-06-13 LAB — CBC
HCT: 32.6 % — ABNORMAL LOW (ref 36.0–46.0)
Hemoglobin: 10.7 g/dL — ABNORMAL LOW (ref 12.0–15.0)
MCH: 30.5 pg (ref 26.0–34.0)
MCHC: 32.8 g/dL (ref 30.0–36.0)
MCV: 92.9 fL (ref 80.0–100.0)
Platelets: 194 10*3/uL (ref 150–400)
RBC: 3.51 MIL/uL — ABNORMAL LOW (ref 3.87–5.11)
RDW: 13.3 % (ref 11.5–15.5)
WBC: 12.1 10*3/uL — ABNORMAL HIGH (ref 4.0–10.5)
nRBC: 0 % (ref 0.0–0.2)

## 2023-06-13 MED ORDER — OXYCODONE HCL 5 MG PO TABS
5.0000 mg | ORAL_TABLET | ORAL | 0 refills | Status: AC | PRN
  Filled 2023-06-13: qty 30, 5d supply, fill #0

## 2023-06-13 MED ORDER — METHOCARBAMOL 500 MG PO TABS
500.0000 mg | ORAL_TABLET | Freq: Four times a day (QID) | ORAL | 0 refills | Status: AC
  Filled 2023-06-13: qty 40, 10d supply, fill #0

## 2023-06-13 MED ORDER — POLYETHYLENE GLYCOL 3350 17 GM/SCOOP PO POWD
17.0000 g | Freq: Every day | ORAL | 0 refills | Status: AC
  Filled 2023-06-13: qty 238, 14d supply, fill #0

## 2023-06-13 MED ORDER — ACETAMINOPHEN 500 MG PO TABS
1000.0000 mg | ORAL_TABLET | Freq: Three times a day (TID) | ORAL | 0 refills | Status: AC
  Filled 2023-06-13: qty 126, 21d supply, fill #0

## 2023-06-13 MED ORDER — SENNA 8.6 MG PO TABS
1.0000 | ORAL_TABLET | Freq: Two times a day (BID) | ORAL | 0 refills | Status: AC
  Filled 2023-06-13: qty 28, 14d supply, fill #0

## 2023-06-13 NOTE — Discharge Instructions (Addendum)
 Orthopedic Surgery Discharge Instructions  Patient name: Charlotte Brooks Procedure Performed: L4/5 laminectomy Date of Surgery: 06/12/2023 Surgeon: Colette Davies, MD  Pre-operative Diagnosis: lumbar stenosis, lumbar radiculopathy Post-operative Diagnosis: same as above   Discharge Date: 06/13/2023 Discharged to: home Discharge Condition: stable  Activity: You should refrain from bending, lifting, or twisting with objects greater than ten pounds until six weeks after surgery. You are encouraged to walk as much as desired. You can perform household activities such as cleaning dishes, doing laundry, vacuuming, etc. as long as the ten-pound restriction is followed. You do not need to wear a brace during the post-operative period.   Incision Care: Your incision site has a dressing over it. That dressing should remain in place and dry at all times for a total of one week after surgery. After one week, you can remove the dressing. Underneath the dressing, you will find skin glue. You should leave this skin glue in place. It will fall off with time. Do not pick, rub, or scrub at it. Do not put cream or lotion over the surgical area. After one week and once the dressing is off, it is okay to let soap and water run over your incision. Again, do not pick, scrub, or rub at the skin glue when bathing. Do not submerge (e.g., take a bath, swim, go in a hot tub, etc.) until six weeks after surgery. There may be some bloody drainage from the incision into the dressing after surgery. This is normal. You do not need to replace the dressing. Continue to leave it in place for the one week as instructed above. Should the dressing become saturated with blood or drainage, please call the office for further instructions.   Medications: You have been prescribed oxycodone. This is a narcotic pain medication and should only be taken as prescribed. You should not drink alcohol or operate heavy machinery (including driving)  while taking this medication. The oxycodone can cause constipation as a side effect. For that reason, you have been prescribed senna and miralax . These are both laxatives. You do not need to take this medication if you develop diarrhea. Should you remain constipated even while taking these medications, please increase the dose of miralax  to twice daily. Tylenol  has been prescribed to be taken every 8 hours, which will give you additional pain relief. Robaxin is a muscle relaxer that has been prescribed to you for muscle spasm type pain. Take this medication as needed.   You can use over-the-counter NSAIDs (ibuprofen, Aleve, Celebrex, naproxen, meloxicam , etc.) for additional pain relief after this surgery. These medications are safe to take with the Tylenol  you have been prescribed. You should not take these medications if you have or have had kidney problems or gastrointestinal ulcers. Take these medications as instructed on the packaging.   In order to set expectations for opioid prescriptions, you will only be prescribed opioids for a total of six weeks after surgery and, at two-weeks after surgery, your opioid prescription will start to tapered (decreased dosage and number of pills). If you have ongoing need for opioid medication six weeks after surgery, you will be referred to pain management. If you are already established with a provider that is giving you opioid medications, you should schedule an appointment with them for six weeks after surgery if you feel you are going to need another prescription. State law only allows for opioid prescriptions one week at a time. If you are running out of opioid medication near the end  of the week, please call the office during business hours before running out so I can send you another prescription.   You may resume any home blood thinners (warfarin, lovenox, apixaban, plavix, xarelto, etc) 72 hours after your surgery. Take these medications as they were  previously prescribed.  Driving: You should not drive while taking narcotic pain medications. You should start getting back to driving slowly and you may want to try driving in a parking lot before doing anything more.   Diet: You are safe to resume your regular diet after surgery.   Reasons to Call the Office After Surgery: You should feel free to call the office with any concerns or questions you have in the post-operative period, but you should definitely notify the office if you develop: -shortness of breath, chest pain, or trouble breathing -excessive bleeding, drainage, redness, or swelling around the surgical site -fevers, chills, or pain that is getting worse with each passing day -persistent nausea or vomiting -new weakness in either leg -new or worsening numbness or tingling in either leg -numbness in the groin, bowel or bladder incontinence -other concerns about your surgery  Follow Up Appointments: You should have an office appointment scheduled for approximately two weeks after surgery. If you do not remember when this appointment is or do not already have it scheduled, please call the office to schedule.   Office Information:  -Colette Davies, MD -Phone number: [phone] -Address: [address]       [place], [place] [address]

## 2023-06-13 NOTE — Progress Notes (Addendum)
 Orthopedic Surgery Post-operative Progress Note  Assessment: Patient is a 78 y.o. female who is currently admitted after undergoing L4/5 laminectomy   Plan: -Operative plans complete -Drains: none -Out of bed as tolerated, no brace -No bending/lifting/twisting greater than 10 pounds -PT evaluate and treat -Pain control -Regular diet -No chemoprophylaxis for dvt or antiplatelets for 72 hours after surgery -Clindamycin x2 post-operative doses -Anticipate discharge to home today  Acute anemia from surgical blood loss -Patient with hemoglobin of 10.7 from 13.2 pre-operatively. Patient is asymptomatic and no further blood loss is expected so will continue to monitor for now  ___________________________________________________________________________   Subjective: No acute events overnight. Feels leg pain is much better since surgery. Having back pain that is well controlled with current medications. Has ambulated the halls with nursing. Denies paresthesias and numbness.   Objective:  General: no acute distress, appropriate affect Neurologic: alert, answering questions appropriately, following commands Respiratory: unlabored breathing on room air Skin: dressing clear/dry/intact  MSK (spine):  -Strength exam      Right  Left  EHL    5/5  5/5 TA    5/5  5/5 GSC    5/5  5/5 Knee extension  5/5  5/5 Hip flexion   5/5  5/5  -Sensory exam    Sensation intact to light touch in L2-S1 nerve distributions of bilateral lower extremities   Patient name: Charlotte Brooks Patient MRN: [id number] Date: 06/13/23

## 2023-06-13 NOTE — Progress Notes (Signed)
 Patient awaiting family for discharge home, Patient in no acute distress nor complaints of pain nor discomfort; incision on hip is clean, dry and intact; No c/o pain at this time. Room was checked and accounted for all patient's belongings; discharge instructions concerning his medications, incision care, follow up appointment and when to call the doctor as needed were all discussed with patient by RN and she expressed understanding on the instructions given.

## 2023-06-13 NOTE — Care Management Obs Status (Signed)
 MEDICARE OBSERVATION STATUS NOTIFICATION   Patient Details  Name: TORAH PINNOCK MRN: [id number] Date of Birth: [birth year]   Medicare Observation Status Notification Given:  Yes    Felix Host 06/13/2023, 10:20 AM

## 2023-06-13 NOTE — Evaluation (Signed)
 Occupational Therapy Evaluation/Discharge Patient Details Name: Charlotte Brooks MRN: [id number] DOB: 1945/01/30 Today's Date: 06/13/2023   History of Present Illness   78 y/o female with lumbar radiculopathy. Conservative treatment did not resolve symptoms. Pt underwent L4/5 laminectomy on 06/12/23. PMH: chronic SI joint pain, DDD, senile OA, OA of B knees.     Clinical Impressions Patient is s/p L4/5 laminectomy surgery resulting in functional limitations due to the deficits listed below (see OT Problem List). All education has been completed during evaluation. Pt educated on back precautions. Education also provided for compensatory strategies during ADLs/mobility in order to maintain back precautions. Pt verbalized and/or demonstrated understanding. Recommended use of RW at home initially to increase independence with ADL tasks. No follow up OT services recommended. Thank you for the referral. OT will sign off and follow up with surgeon when recommended.       If plan is discharge home, recommend the following:   Assist for transportation;Assistance with cooking/housework;Help with stairs or ramp for entrance;A little help with bathing/dressing/bathroom     Functional Status Assessment   Patient has had a recent decline in their functional status and demonstrates the ability to make significant improvements in function in a reasonable and predictable amount of time.     Equipment Recommendations   None recommended by OT      Precautions/Restrictions   Precautions Precautions: Back Precaution Booklet Issued: Yes (comment) Recall of Precautions/Restrictions: Intact Precaution/Restrictions Comments: Pt educated on back precautions with handout provided. Pt verbalized understanding. No brace needed per MD. Restrictions Weight Bearing Restrictions Per Provider Order: No     Mobility Bed Mobility Overal bed mobility: Needs Assistance Bed Mobility: Rolling, Sidelying to  Sit, Sit to Sidelying Rolling: Min assist Sidelying to sit: Min assist     Sit to sidelying: Min assist General bed mobility comments: VC provided for log rolling technique with physical assist for technique. Provided assist to bring BLE back onto bed. Education provided on completing log rolling at home using handle of RW as bed rail if needed. Also discussed sleeping in recliner initially if necessary.    Transfers Overall transfer level: Needs assistance Equipment used: Rolling walker (2 wheels) Transfers: Sit to/from Stand, Bed to chair/wheelchair/BSC Sit to Stand: Supervision     Step pivot transfers: Supervision     General transfer comment: Pt able to go up/down one step with HHA to simulate entry into home. Education provided on family member providing HHA as needed for support.      Balance Overall balance assessment: Mild deficits observed, not formally tested         ADL either performed or assessed with clinical judgement   ADL        General ADL Comments: Due to recent back surgery, pt required Min A with LB bathing and dressing. Pt reports that her daughter will be able to assist her to wash her hair unti she is able to get into the shower.     Vision Baseline Vision/History: 1 Wears glasses Ability to See in Adequate Light: 0 Adequate Patient Visual Report: No change from baseline Vision Assessment?: Wears glasses for reading     Perception Perception: Not tested       Praxis Praxis: Not tested       Pertinent Vitals/Pain Pain Assessment Pain Assessment: Faces Faces Pain Scale: Hurts even more Pain Location: Low back during mobility/bed mobility. Pain Descriptors / Indicators: Aching, Discomfort, Grimacing Pain Intervention(s): Monitored during session, Limited activity within patient's tolerance, Repositioned  Extremity/Trunk Assessment Upper Extremity Assessment Upper Extremity Assessment: Left hand dominant;Overall [facility] for tasks  assessed   Lower Extremity Assessment Lower Extremity Assessment: Overall WFL for tasks assessed   Cervical / Trunk Assessment Cervical / Trunk Assessment: Back Surgery   Communication Communication Communication: No apparent difficulties   Cognition Arousal: Alert Behavior During Therapy: WFL for tasks assessed/performed Cognition: No apparent impairments    Following commands: Intact       Cueing  General Comments   Cueing Techniques: Verbal cues              Home Living Family/patient expects to be discharged to:: Private residence Living Arrangements: Spouse/significant other Available Help at Discharge: Family;Available PRN/intermittently Type of Home: House Home Access: Stairs to enter [employer] of Steps: 1 Entrance Stairs-Rails: None Home Layout: Two level;Able to live on main level with bedroom/bathroom     Bathroom Shower/Tub: Producer, television/film/video: Handicapped height     Home Equipment: Shower seat - built in;Hand held Programmer, systems (2 wheels)   Additional Comments: Husband is available to provided SBA. Unable to provide physical assistance.      Prior Functioning/Environment Prior Level of Function : Independent/Modified Independent;Driving    ADLs Comments: Pt volunteers at church.    OT Problem List: Decreased strength;Pain;Impaired balance (sitting and/or standing)        OT Goals(Current goals can be found in the care plan section)   Acute Rehab OT Goals Patient Stated Goal: to go home and recover OT Goal Formulation: All assessment and education complete, DC therapy   OT Frequency:          AM-PAC OT "6 Clicks" Daily Activity     Outcome Measure Help from another person eating meals?: None Help from another person taking care of personal grooming?: None Help from another person toileting, which includes using toliet, bedpan, or urinal?: None Help from another person bathing (including  washing, rinsing, drying)?: A Little Help from another person to put on and taking off regular upper body clothing?: None Help from another person to put on and taking off regular lower body clothing?: A Little 6 Click Score: 22   End of Session Equipment Utilized During Treatment: Rolling walker (2 wheels)  Activity Tolerance: Patient tolerated treatment well;Patient limited by pain Patient left: in bed;with call bell/phone within reach  OT Visit Diagnosis: Unsteadiness on feet (R26.81);Muscle weakness (generalized) (M62.81)                Time: 1610-9604 OT Time Calculation (min): 28 min Charges:  OT General Charges $OT Visit: 1 Visit OT Evaluation $OT Eval Moderate Complexity: 1 Mod OT Treatments $Self Care/Home Management : 8-22 mins  Carollee Circle, OTR/L,CBIS  Supplemental OT - [facility] and [facility] Secure Chat Preferred    Tanette Chauca, Ocie Belt 06/13/2023, 8:19 AM

## 2023-06-13 NOTE — Discharge Summary (Signed)
 Orthopedic Surgery Discharge Summary  Patient name: Charlotte Brooks Patient MRN: [id number] Admit today: 06/12/2023 Discharge date: 06/13/23  Attending physician: Colette Davies, MD Final diagnosis: lumbar stenosis, lumbar radiculopathy Findings: L4/5 hypertrophic facets and thickened ligamentum flavum, L4/5 spondylolisthesis   Hospital course: Patient is a 78 y.o. female who was admitted after undergoing L4/5 laminectomy. The patient had significant pain immediately after surgery, but pain eventually was controlled with a multimodal regimen including oxycodone. Labs during the hospitalization revealed acute anemia from surgical blood loss. Patient was asymptomatic and no intervention was performed. No significant electrolyte abnormalities were found. The patient worked with physical therapy who recommended discharge to home. The patient was tolerating an oral diet without issue and was voiding spontaneously after surgery. The patient's vitals were stable on the day of discharge. The patient was medically ready for discharge and was discharge to home on post-operative day one.  Instructions:   Orthopedic Surgery Discharge Instructions  Patient name: Charlotte Brooks Procedure Performed: L4/5 laminectomy Date of Surgery: 06/12/2023 Surgeon: Colette Davies, MD  Pre-operative Diagnosis: lumbar stenosis, lumbar radiculopathy Post-operative Diagnosis: same as above   Discharge Date: 06/13/2023 Discharged to: home Discharge Condition: stable  Activity: You should refrain from bending, lifting, or twisting with objects greater than ten pounds until six weeks after surgery. You are encouraged to walk as much as desired. You can perform household activities such as cleaning dishes, doing laundry, vacuuming, etc. as long as the ten-pound restriction is followed. You do not need to wear a brace during the post-operative period.   Incision Care: Your incision site has a dressing over it. That  dressing should remain in place and dry at all times for a total of one week after surgery. After one week, you can remove the dressing. Underneath the dressing, you will find skin glue. You should leave this skin glue in place. It will fall off with time. Do not pick, rub, or scrub at it. Do not put cream or lotion over the surgical area. After one week and once the dressing is off, it is okay to let soap and water run over your incision. Again, do not pick, scrub, or rub at the skin glue when bathing. Do not submerge (e.g., take a bath, swim, go in a hot tub, etc.) until six weeks after surgery. There may be some bloody drainage from the incision into the dressing after surgery. This is normal. You do not need to replace the dressing. Continue to leave it in place for the one week as instructed above. Should the dressing become saturated with blood or drainage, please call the office for further instructions.   Medications: You have been prescribed oxycodone. This is a narcotic pain medication and should only be taken as prescribed. You should not drink alcohol or operate heavy machinery (including driving) while taking this medication. The oxycodone can cause constipation as a side effect. For that reason, you have been prescribed senna and miralax . These are both laxatives. You do not need to take this medication if you develop diarrhea. Should you remain constipated even while taking these medications, please increase the dose of miralax  to twice daily. Tylenol  has been prescribed to be taken every 8 hours, which will give you additional pain relief. Robaxin is a muscle relaxer that has been prescribed to you for muscle spasm type pain. Take this medication as needed.   You can use over-the-counter NSAIDs (ibuprofen, Aleve, Celebrex, naproxen, meloxicam , etc.) for additional pain relief after this surgery.  These medications are safe to take with the Tylenol  you have been prescribed. You should not take  these medications if you have or have had kidney problems or gastrointestinal ulcers. Take these medications as instructed on the packaging.   In order to set expectations for opioid prescriptions, you will only be prescribed opioids for a total of six weeks after surgery and, at two-weeks after surgery, your opioid prescription will start to tapered (decreased dosage and number of pills). If you have ongoing need for opioid medication six weeks after surgery, you will be referred to pain management. If you are already established with a provider that is giving you opioid medications, you should schedule an appointment with them for six weeks after surgery if you feel you are going to need another prescription. State law only allows for opioid prescriptions one week at a time. If you are running out of opioid medication near the end of the week, please call the office during business hours before running out so I can send you another prescription.   You may resume any home blood thinners (warfarin, lovenox, apixaban, plavix, xarelto, etc) 72 hours after your surgery. Take these medications as they were previously prescribed.  Driving: You should not drive while taking narcotic pain medications. You should start getting back to driving slowly and you may want to try driving in a parking lot before doing anything more.   Diet: You are safe to resume your regular diet after surgery.   Reasons to Call the Office After Surgery: You should feel free to call the office with any concerns or questions you have in the post-operative period, but you should definitely notify the office if you develop: -shortness of breath, chest pain, or trouble breathing -excessive bleeding, drainage, redness, or swelling around the surgical site -fevers, chills, or pain that is getting worse with each passing day -persistent nausea or vomiting -new weakness in either leg -new or worsening numbness or tingling in either  leg -numbness in the groin, bowel or bladder incontinence -other concerns about your surgery  Follow Up Appointments: You should have an office appointment scheduled for approximately two weeks after surgery. If you do not remember when this appointment is or do not already have it scheduled, please call the office to schedule.   Office Information:  -Colette Davies, MD -Phone number: [phone] -Address: [address]       [place], [place] [address]

## 2023-06-13 NOTE — Plan of Care (Signed)

## 2023-06-25 ENCOUNTER — Ambulatory Visit (INDEPENDENT_AMBULATORY_CARE_PROVIDER_SITE_OTHER): Admitting: Orthopedic Surgery

## 2023-06-25 DIAGNOSIS — Z9889 Other specified postprocedural states: Secondary | ICD-10-CM

## 2023-06-25 NOTE — Progress Notes (Signed)
 Orthopedic Surgery Post-operative Office Visit  Procedure: L4/5 laminectomy  Date of Surgery: 06/12/2023 (~ 2 weeks post-op)  Assessment: Patient is a 78 y.o. who is doing well after surgery   Plan: -Operative plans complete -Out of bed as tolerated, no brace -No bending/lifting/twisting greater than 10 pounds -Pain management: Tylenol  as needed -Okay to let soap/water run over the incision but do not submerge -Return to office in 4 weeks, x-rays needed at next visit: None  ___________________________________________________________________________   Subjective: Patient has been doing well since discharge from the hospital.  She initially had some back pain but that has resolved.  She is not having any radiating leg pain.  She has noticed periodic paresthesias in the right posterior thigh.  No other numbness or paresthesias.  She is not taking any medication for pain including Tylenol .  She is ambulating without assist devices.  She has not noticed any redness or drainage around her incision.  Objective:  General: no acute distress, appropriate affect Neurologic: alert, answering questions appropriately, following commands Respiratory: unlabored breathing on room air Skin: incision is well-approximated with no erythema, induration, active/expressible drainage  MSK (spine):  -Strength exam      Left  Right  EHL    5/5  5/5 TA    5/5  5/5 GSC    5/5  5/5 Knee extension  5/5  5/5 Hip flexion   5/5  5/5  -Sensory exam    Sensation intact to light touch in L2-S1 nerve distributions of bilateral lower extremities  Imaging: None obtained   Patient name: Charlotte Brooks Patient MRN: [id number] Date of visit: 06/25/23

## 2023-07-11 ENCOUNTER — Ambulatory Visit: Admitting: Orthopaedic Surgery

## 2023-07-23 ENCOUNTER — Encounter: Admitting: Orthopedic Surgery

## 2023-07-23 ENCOUNTER — Ambulatory Visit: Admitting: Orthopaedic Surgery

## 2023-07-30 ENCOUNTER — Telehealth: Payer: Self-pay

## 2023-07-30 ENCOUNTER — Other Ambulatory Visit (HOSPITAL_COMMUNITY): Payer: Self-pay

## 2023-07-30 NOTE — Telephone Encounter (Signed)
 Prolia  VOB initiated via [email or web address]  Next Prolia  inj DUE: 08/29/23

## 2023-07-30 NOTE — Telephone Encounter (Signed)
 SABRA

## 2023-07-30 NOTE — Telephone Encounter (Signed)
 Pt ready for scheduling for PROLIA  on or after : 08/29/23  Option# 1: Buy/Bill (Office supplied medication)  Out-of-pocket cost due at time of clinic visit: $0  Number of injection/visits approved: 2  Primary: [employer] Prolia  co-insurance: 0% Admin fee co-insurance: 0%  Secondary: --- Prolia  co-insurance:  Admin fee co-insurance:   Medical Benefit Details: Date Benefits were checked: 07/30/23 Deductible: $200 Met of $200 Required/ Coinsurance: 0%/ Admin Fee: 0%  Prior Auth: APPROVED PA# [id number] Expiration Date: 02/20/23-02/20/24  # of doses approved: 2 ----------------------------------------------------------------------- Option# 2- Med Obtained from pharmacy:  Pharmacy benefit: Copay $150 (Paid to pharmacy) Admin Fee: 0% (Pay at clinic)  Prior Auth: N/A PA# Expiration Date:   # of doses approved:   If patient wants fill through the pharmacy benefit please send prescription to: [facility], and include estimated need by date in rx notes. Pharmacy will ship medication directly to the office.  Patient NOT eligible for Prolia  Copay Card. Copay Card can make patient's cost as little as $25. Link to apply: [email or web address]  ** This summary of benefits is an estimation of the patient's out-of-pocket cost. Exact cost may very based on individual plan coverage.

## 2023-08-02 ENCOUNTER — Encounter: Payer: Self-pay | Admitting: Orthopaedic Surgery

## 2023-08-02 ENCOUNTER — Ambulatory Visit: Admitting: Orthopedic Surgery

## 2023-08-02 ENCOUNTER — Ambulatory Visit (INDEPENDENT_AMBULATORY_CARE_PROVIDER_SITE_OTHER): Admitting: Orthopaedic Surgery

## 2023-08-02 DIAGNOSIS — M1711 Unilateral primary osteoarthritis, right knee: Secondary | ICD-10-CM | POA: Diagnosis not present

## 2023-08-02 DIAGNOSIS — Z9889 Other specified postprocedural states: Secondary | ICD-10-CM

## 2023-08-02 DIAGNOSIS — M1712 Unilateral primary osteoarthritis, left knee: Secondary | ICD-10-CM | POA: Diagnosis not present

## 2023-08-02 NOTE — Progress Notes (Signed)
 The patient is following up as relates to her bilateral knee mild osteoarthritis.  She has had hyaluronic acid injections for several years about once a year and those have helped greatly to ease the pain from the arthritis in her knees.  It has been years since she has had this type of injections.  She is to get them from one of our colleagues and down.  She has been recovering from lumbar spine surgery that my partner Dr. Georgina performed and she has done excellent from that surgery.  She is 78 years old and very active.  She is starting to have a little bit of pain with her right knee.  Both knees are examined today and neither knee has an effusion.  Both knees have just some slight patellofemoral crepitation but good range of motion.  She is a perfect candidate for hyaluronic acid and continue this for both knees to treat her pain from osteoarthritis.  Having had this before she is fully aware the risks and benefits of these types of injections and continues to do quite well with these injections.  Hopefully we can get these approved and ordered in the near future.  Will be in touch.  All question concerns were answered and addressed.  This patient is diagnosed with osteoarthritis of the knee(s).    Radiographs show evidence of joint space narrowing, osteophytes, subchondral sclerosis and/or subchondral cysts.  This patient has knee pain which interferes with functional and activities of daily living.    This patient has experienced inadequate response, adverse effects and/or intolerance with conservative treatments such as acetaminophen , NSAIDS, topical creams, physical therapy or regular exercise, knee bracing and/or weight loss.   This patient has experienced inadequate response or has a contraindication to intra articular steroid injections for at least 3 months.   This patient is not scheduled to have a total knee replacement within 6 months of starting treatment with viscosupplementation.

## 2023-08-02 NOTE — Progress Notes (Signed)
 Orthopedic Surgery Post-operative Office Visit   Procedure: L4/5 laminectomy  Date of Surgery: 06/12/2023 (~6 weeks post-op)   Assessment: Patient is a 78 y.o. who is doing well after surgery     Plan: -No spine specific precautions, activity as tolerated -Okay to submerge wound -Pain management: Tylenol  as needed -Return to office in 6 weeks, x-rays needed at next visit: AP/lateral/flex/ex lumbar   ___________________________________________________________________________     Subjective: Patient is very pleased with her surgical outcome.  She has noticed significant improvement in her pain.  She still has some mild paresthesias over the lateral aspect of her right posterolateral thigh.  She said they are not painful.  She has no other numbness or paresthesias.  No pain radiating either lower extremity.  No back pain.  Has not seen any redness or drainage seen around her incision.   Objective:   General: no acute distress, appropriate affect Neurologic: alert, answering questions appropriately, following commands Respiratory: unlabored breathing on room air Skin: incision is well healed with no erythema, induration, active/expressible drainage   MSK (spine):   -Strength exam                                                   Left                  Right   EHL                              5/5                  5/5 TA                                 5/5                  5/5 GSC                             5/5                  5/5 Knee extension            5/5                  5/5 Hip flexion                    5/5                  5/5   -Sensory exam                           Sensation intact to light touch in L2-S1 nerve distributions of bilateral lower extremities   Imaging: None obtained     Patient name: Charlotte Brooks Patient MRN: [id number] Date of visit: 08/02/23

## 2023-08-03 ENCOUNTER — Other Ambulatory Visit: Payer: Self-pay

## 2023-08-03 ENCOUNTER — Telehealth: Payer: Self-pay

## 2023-08-03 ENCOUNTER — Other Ambulatory Visit: Payer: Self-pay | Admitting: Nurse Practitioner

## 2023-08-03 DIAGNOSIS — M810 Age-related osteoporosis without current pathological fracture: Secondary | ICD-10-CM

## 2023-08-03 MED ORDER — PROLIA 60 MG/ML ~~LOC~~ SOSY
60.0000 mg | PREFILLED_SYRINGE | Freq: Once | SUBCUTANEOUS | 0 refills | Status: AC
  Filled 2023-08-03: qty 1, 1d supply, fill #0
  Filled 2023-08-06: qty 1, 180d supply, fill #0

## 2023-08-03 NOTE — Telephone Encounter (Signed)
Bilateral gel injections  

## 2023-08-06 ENCOUNTER — Other Ambulatory Visit: Payer: Self-pay

## 2023-08-06 NOTE — Progress Notes (Signed)
 Specialty Pharmacy Refill Coordination Note  Charlotte Brooks is a 78 y.o. female contacted today regarding refills of specialty medication(s) Denosumab  (Prolia )   Patient requested Courier to Provider Office   Delivery date: 08/20/23   Verified address: [facility] [address]   Medication will be filled on 08.01.25. Patient approved $150.00 copay and CC on file.

## 2023-08-15 ENCOUNTER — Other Ambulatory Visit: Payer: Self-pay

## 2023-08-15 NOTE — Telephone Encounter (Signed)
VOB submitted for Durolane, bilateral knee  

## 2023-08-20 ENCOUNTER — Telehealth: Payer: Self-pay

## 2023-08-20 NOTE — Telephone Encounter (Signed)
 Prolia  injection arrived at the clinic on 08/20/2023 and placed in the clinic fridge until administered. NV is scheduled for 08/29/2023 for injection per Roselie Mood NP orders.    Last injection administered was on 03/01/2023; (every 6 months)

## 2023-08-29 ENCOUNTER — Ambulatory Visit: Payer: Medicare Other

## 2023-08-29 DIAGNOSIS — M810 Age-related osteoporosis without current pathological fracture: Secondary | ICD-10-CM

## 2023-08-29 MED ORDER — DENOSUMAB 60 MG/ML ~~LOC~~ SOSY
60.0000 mg | PREFILLED_SYRINGE | Freq: Once | SUBCUTANEOUS | Status: AC
  Administered 2023-08-29 (×2): 60 mg via SUBCUTANEOUS

## 2023-08-29 NOTE — Telephone Encounter (Signed)
 Prolia  is patient-supplied. No copay due at check in. Drop Admin Fee in the Wrap Up tab.

## 2023-08-29 NOTE — Progress Notes (Signed)
 Per orders of Roselie Mood, NP, injection of Prolia   given by Joeseph Piety in right deltoid subcutaneous. Patient tolerated injection well. Patient will make appointment for 6 month.

## 2023-09-07 ENCOUNTER — Ambulatory Visit
Admission: EM | Admit: 2023-09-07 | Discharge: 2023-09-07 | Disposition: A | Discharge status: Home / Self Care | Attending: Physician Assistant | Admitting: Physician Assistant

## 2023-09-07 ENCOUNTER — Other Ambulatory Visit: Payer: Self-pay

## 2023-09-07 ENCOUNTER — Ambulatory Visit: Admitting: Radiology

## 2023-09-07 DIAGNOSIS — M79671 Pain in right foot: Secondary | ICD-10-CM

## 2023-09-07 NOTE — ED Provider Notes (Addendum)
 [facility]    CSN: [id number] Arrival date & time: 09/07/23  1535      History   Chief Complaint Chief Complaint  Patient presents with   Foot Pain    HPI Charlotte Brooks is a 78 y.o. female.   HPI  Pt is here for concerns of right foot pain that started after she caught herself to prevent a fall She denies hitting her head or actually falling   She reports she was outside and working on an incline and started to slip so she tried to catch herself to prevent a fall. She states she initially felt fine but later in the evening her foot started to hurt badly Pain level and character: with standing and walking 4-5/10 and sharp in nature  She denies bruising or swelling, redness  Last night she took Tylenol  but did not notice much of a difference   Past Medical History:  Diagnosis Date   Arthritis    all over   Cataracts, bilateral    MD just watching   Constipation    Osteoporosis    Gets Reclast     Patient Active Problem List   Diagnosis Date Noted   Lumbar radiculopathy 06/12/2023   Chronic insomnia 04/09/2023   Asymptomatic PVD (peripheral vascular disease) (HCC) 04/06/2022   Dysfunction of both eustachian tubes 03/08/2022   Chronic constipation 10/01/2020   Cherry angioma 05/03/2020   Seborrheic keratoses 05/03/2020   Multiple benign nevi 05/03/2020   Atypical glandular cells of undetermined significance (AGUS) on cervical Pap smear 11/15/2017   Postmenopausal bleeding 09/25/2017   DDD (degenerative disc disease), lumbar 06/28/2017   Chronic SI joint pain 06/28/2017   Primary osteoarthritis of both knees 06/28/2017   Senile osteoporosis 05/18/2009   Persistent disorder of initiating or maintaining sleep 05/31/2006    Past Surgical History:  Procedure Laterality Date   COLONOSCOPY     x several   DECOMPRESSIVE LUMBAR LAMINECTOMY LEVEL 1 N/A 06/12/2023   Procedure: DECOMPRESSIVE LUMBAR LAMINECTOMY LUMBAR FOUR-FIVE LEVEL 1;  Surgeon: Georgina Ozell LABOR, MD;  Location: [facility] OR;  Service: Orthopedics;  Laterality: N/A;   HERNIA REPAIR  02/2006   KNEE ARTHROSCOPY Bilateral    x 2 -  lateral release   KNEE SURGERY Right    POLYPECTOMY     hx with colonoscopy    OB History   No obstetric history on file.      Home Medications    Prior to Admission medications   Medication Sig Start Date End Date Taking? Authorizing Provider  Biotin 5000 MCG CAPS Take 10,000 mcg by mouth daily.    [provider]  Cholecalciferol (VITAMIN D  PO) Take 1,000 Units by mouth daily.    [provider]  denosumab  (PROLIA ) 60 MG/ML SOLN injection Inject 60 mg into the skin every 6 (six) months. Administer in upper arm, thigh, or abdomen 01/26/17   Antonio Meth, Jamee SAUNDERS, DO  diclofenac  Sodium (VOLTAREN ) 1 % GEL Apply 2 g topically 3 (three) times daily as needed. Patient not taking: Reported on 05/25/2023 04/06/22   Nche, Roselie Rockford, NP  meloxicam  (MOBIC ) 7.5 MG tablet TAKE 1 TO 2 TABLETS BY  MOUTH ONCE DAILY WITH FOOD  AS NEEDED FOR PAIN Patient taking differently: Take 7.5 mg by mouth daily. 04/09/23   Nche, Roselie Rockford, NP  Misc Natural Products (TART CHERRY ADVANCED PO) Take 1 tablet by mouth daily.    [provider]  Omega-3 Fatty Acids (FISH OIL) 1000  MG CAPS Take 1,000 mg by mouth daily.    [provider]  vitamin C (ASCORBIC ACID) 500 MG tablet Take 1,000 mg by mouth daily.     [provider]  Wheat Dextrin (BENEFIBER PO) Take 1 Scoop by mouth in the morning and at bedtime.    [provider]    Family History Family History  Problem Relation Age of Onset   Stroke Mother    Hypertension Mother    Diabetes Brother    Heart disease Brother    Coronary artery disease Other    Diabetes Maternal Grandmother    Diabetes Maternal Grandfather    Colon cancer Neg Hx    Esophageal cancer Neg Hx    Pancreatic cancer Neg Hx    Rectal cancer Neg Hx    Stomach cancer Neg Hx    Colon polyps  Neg Hx     Social History Social History   Tobacco Use   Smoking status: Never   Smokeless tobacco: Never  Vaping Use   Vaping status: Never Used  Substance Use Topics   Alcohol use: Not Currently    Comment: [employer] yearly   Drug use: Never     Allergies   Penicillins, Codeine, and Sulfonamide derivatives   Review of Systems Review of Systems  Musculoskeletal:        Right foot pain      Physical Exam Triage Vital Signs ED Triage Vitals  Encounter Vitals Group     BP 09/07/23 1542 (!) 158/76     Girls Systolic BP Percentile --      Girls Diastolic BP Percentile --      Boys Systolic BP Percentile --      Boys Diastolic BP Percentile --      Pulse Rate 09/07/23 1542 70     Resp 09/07/23 1542 18     Temp 09/07/23 1542 98.1 F (36.7 C)     Temp Source 09/07/23 1542 Oral     SpO2 09/07/23 1542 97 %     Weight 09/07/23 1542 150 lb (68 kg)     Height 09/07/23 1542 5' 5.5 (1.664 m)     Head Circumference --      Peak Flow --      Pain Score 09/07/23 1611 6     Pain Loc --      Pain Education --      Exclude from Growth Chart --    No data found.  Updated Vital Signs BP (!) 150/79 (BP Location: Right Arm) Comment: pt requested recheck after she had been sitting for a while  Pulse 70   Temp 98.1 F (36.7 C) (Oral)   Resp 18   Ht 5' 5.5 (1.664 m)   Wt 150 lb (68 kg)   SpO2 97%   BMI 24.58 kg/m   Visual Acuity Right Eye Distance:   Left Eye Distance:   Bilateral Distance:    Right Eye Near:   Left Eye Near:    Bilateral Near:     Physical Exam Vitals reviewed.  Constitutional:      General: She is awake.     Appearance: Normal appearance. She is well-developed and well-groomed.  HENT:     Head: Normocephalic and atraumatic.  Eyes:     General: Lids are normal. Gaze aligned appropriately.     Extraocular Movements: Extraocular movements intact.     Conjunctiva/sclera: Conjunctivae normal.  Pulmonary:     Effort: Pulmonary effort is  normal.  Musculoskeletal:       Feet:     Comments: Patient has intact range of motion with regards to ankle dorsiflexion, plantarflexion, supination, pronation.  She reports pain across the dorsum of the foot with flexion of the toes.  There is no obvious evidence of injury such as swelling, erythema, bruising, laceration, excoriation or abrasion  Neurological:     Mental Status: She is alert and oriented to person, place, and time.  Psychiatric:        Attention and Perception: Attention and perception normal.        Mood and Affect: Mood and affect normal.        Speech: Speech normal.        Behavior: Behavior normal. Behavior is cooperative.      UC Treatments / Results  Labs (all labs ordered are listed, but only abnormal results are displayed) Labs Reviewed - No data to display  EKG   Radiology DG Foot Complete Right Result Date: 09/07/2023 CLINICAL DATA:  Pain across the top of the right foot for 2 days. No known injury. History of osteoporosis. EXAM: RIGHT FOOT COMPLETE - 3+ VIEW COMPARISON:  None Available. FINDINGS: Mild degenerative changes in the interphalangeal joints and first metatarsal-phalangeal joint. No evidence of acute fracture or dislocation. No focal bone lesion or bone destruction. Bone cortex appears intact. Soft tissues are unremarkable. IMPRESSION: Minimal degenerative changes.  No acute bony abnormalities. Electronically Signed   By: Elsie Gravely M.D.   On: 09/07/2023 16:26    Procedures Procedures (including critical care time)  Medications Ordered in UC Medications - No data to display  Initial Impression / Assessment and Plan / UC Course  I have reviewed the triage vital signs and the nursing notes.  Pertinent labs & imaging results that were available during my care of the patient were reviewed by me and considered in my medical decision making (see chart for details).      Final Clinical Impressions(s) / UC Diagnoses   Final  diagnoses:  Right foot pain   Patient presents today with concerns for foot pain that has been ongoing for the past 2 days.  She states that she tried to catch herself to prevent a fall while working outside and initially did not think that she needed sustained an injury but as time progressed her foot has become increasingly painful.  Patient reports pain of the right foot along the dorsum especially with flexion of the toes.  Physical exam does not show obvious signs of injury or reduced range of motion.  Imaging is negative for signs of acute fracture or dislocation.  At this time I suspect a strain or sprain and recommend supportive measures to assist with discomfort.  Will send patient home with postop shoe to help with stabilization and recommend using Tylenol  for pain.  Reviewed with patient can also use topicals such as Voltaren  or lidocaine  for further pain relief.  ED and return precautions Reviewed and provided in after visit summary.  Follow-up as needed     Discharge Instructions      You were seen today for concerns of right foot pain. At this time I think you may have strained the soft tissues in the top of your foot.  Your imaging was negative for signs of an acute fracture or dislocation but x-ray is not able to visualize the soft tissues. We have supplied you with a postop shoe to help prevent your toes from bending and provide  you with relief. I recommend taking Tylenol  as needed for pain.  You can also use Voltaren  gel or topical lidocaine  to assist with pain management. If you notice any of the following and recommend follow-up here or with orthopedics: Swelling of the foot, significant bruising, difficulty moving or bending your toes, redness, numbness or tingling     ED Prescriptions   None    PDMP not reviewed this encounter.   Marylene Rocky BRAVO, PA-C 09/08/23 1531    Syd Manges, Rocky BRAVO, PA-C 09/08/23 1531

## 2023-09-07 NOTE — ED Triage Notes (Addendum)
 Pt presents with a chief complaint of right foot pain. Pt explains that about two days ago, she caught herself from falling outside on a hill. Initially her right foot caused her no pain however as the night progressed, the pain intensified. Currently rates overall foot pain a 6/10. OTC extra-strength Tylenol  taken with no improvement/relief.  Gait is steady on assessment. Full ROM in right foot.

## 2023-09-07 NOTE — Discharge Instructions (Addendum)
 You were seen today for concerns of right foot pain. At this time I think you may have strained the soft tissues in the top of your foot.  Your imaging was negative for signs of an acute fracture or dislocation but x-ray is not able to visualize the soft tissues. We have supplied you with a postop shoe to help prevent your toes from bending and provide you with relief. I recommend taking Tylenol  as needed for pain.  You can also use Voltaren  gel or topical lidocaine  to assist with pain management. If you notice any of the following and recommend follow-up here or with orthopedics: Swelling of the foot, significant bruising, difficulty moving or bending your toes, redness, numbness or tingling

## 2023-09-12 ENCOUNTER — Ambulatory Visit (INDEPENDENT_AMBULATORY_CARE_PROVIDER_SITE_OTHER): Admitting: Orthopedic Surgery

## 2023-09-12 ENCOUNTER — Other Ambulatory Visit (INDEPENDENT_AMBULATORY_CARE_PROVIDER_SITE_OTHER)

## 2023-09-12 DIAGNOSIS — Z9889 Other specified postprocedural states: Secondary | ICD-10-CM | POA: Diagnosis not present

## 2023-09-12 NOTE — Progress Notes (Signed)
 Orthopedic Surgery Post-operative Office Visit   Procedure: L4/5 laminectomy  Date of Surgery: 06/12/2023 (~3 months post-op)   Assessment: Patient is a 78 y.o. who is doing well after surgery     Plan: -No spine specific precautions, activity as tolerated -Okay to submerge wound -Pain management: Tylenol  as needed -Return to office in 3 months, x-rays needed at next visit: AP/lateral/flex/ex lumbar   ___________________________________________________________________________     Subjective: Patient still is doing well after surgery.  She does get some occasional right leg pain but nothing consistent.  She said when it happens it is mild in nature.  She has no pain radiating into the left lower extremity.  She is not having any back pain.  She is very satisfied with how she has been doing since surgery.  She is not limited in any of her activities and is doing everything that she wants at this point.    Objective:   General: no acute distress, appropriate affect Neurologic: alert, answering questions appropriately, following commands Respiratory: unlabored breathing on room air Skin: incision is well healed   MSK (spine):   -Strength exam                                                   Left                  Right   EHL                              5/5                  5/5 TA                                 5/5                  5/5 GSC                             5/5                  5/5 Knee extension            5/5                  5/5 Hip flexion                    5/5                  5/5   -Sensory exam                           Sensation intact to light touch in L2-S1 nerve distributions of bilateral lower extremities   Imaging: XRs of the lumbar spine from 09/12/2023 were independently reviewed and interpreted, showing disc height loss at L2/3, L3/4, and L4/5. Spondylolisthesis at L4/5 that shifts about 1.56mm between flexion and extension views. Laminectomy defect  seen at L4/5. Lumbar degenerative scoliosis with apex to the right that measures 13 degrees. No fracture or dislocation seen.     Patient name: Charlotte Brooks Patient MRN: [id number]  Date of visit: 09/12/23

## 2023-09-18 ENCOUNTER — Telehealth: Payer: Self-pay

## 2023-09-18 NOTE — Telephone Encounter (Signed)
 Durolane bilateral knee B/B no copay

## 2023-10-10 ENCOUNTER — Ambulatory Visit: Admitting: Orthopaedic Surgery

## 2023-10-10 DIAGNOSIS — M170 Bilateral primary osteoarthritis of knee: Secondary | ICD-10-CM

## 2023-10-10 DIAGNOSIS — M1711 Unilateral primary osteoarthritis, right knee: Secondary | ICD-10-CM

## 2023-10-10 DIAGNOSIS — M1712 Unilateral primary osteoarthritis, left knee: Secondary | ICD-10-CM

## 2023-10-10 MED ORDER — SODIUM HYALURONATE 60 MG/3ML IX PRSY
60.0000 mg | PREFILLED_SYRINGE | INTRA_ARTICULAR | Status: AC | PRN
  Administered 2023-10-10: 60 mg via INTRA_ARTICULAR

## 2023-10-10 NOTE — Progress Notes (Signed)
   Procedure Note  Patient: Charlotte Brooks             Date of Birth: [birth year]           MRN: [id number]             Visit Date: 10/10/2023  Procedures: Visit Diagnoses:  1. Unilateral primary osteoarthritis, left knee   2. Unilateral primary osteoarthritis, right knee     Large Joint Inj: R knee on 10/10/2023 1:12 PM Indications: diagnostic evaluation and pain Details: 22 G 1.5 in needle, superolateral approach  Arthrogram: No  Medications: 60 mg Sodium Hyaluronate 60 MG/3ML Outcome: tolerated well, no immediate complications Procedure, treatment alternatives, risks and benefits explained, specific risks discussed. Consent was given by the patient. Immediately prior to procedure a time out was called to verify the correct patient, procedure, equipment, support staff and site/side marked as required. Patient was prepped and draped in the usual sterile fashion.    Large Joint Inj: L knee on 10/10/2023 1:12 PM Indications: diagnostic evaluation and pain Details: 22 G 1.5 in needle, superolateral approach  Arthrogram: No  Medications: 60 mg Sodium Hyaluronate 60 MG/3ML Outcome: tolerated well, no immediate complications Procedure, treatment alternatives, risks and benefits explained, specific risks discussed. Consent was given by the patient. Immediately prior to procedure a time out was called to verify the correct patient, procedure, equipment, support staff and site/side marked as required. Patient was prepped and draped in the usual sterile fashion.     The patient comes in today for scheduled hyaluronic acid injections in both knees to treat the pain from osteoarthritis.  Is been over a year since she has had these type of injections.  She has slight varus malalignment of her knees with no effusion but pain from osteoarthritis.  Both knees move well.  Durolane was placed in both knees today with some lidocaine  as well.  It was painful for her but she was able to ambulate  later.  I have advised her to take some Aleve or Advil when she is at home.  She understands they are that she can have these again would be 6 months.  Lot #[id number]

## 2023-10-11 ENCOUNTER — Ambulatory Visit: Admitting: Orthopaedic Surgery

## 2023-10-15 LAB — HM MAMMOGRAPHY

## 2023-10-26 ENCOUNTER — Encounter: Payer: Self-pay | Admitting: Nurse Practitioner

## 2023-11-01 ENCOUNTER — Encounter: Payer: Self-pay | Admitting: Physical Medicine and Rehabilitation

## 2023-11-01 ENCOUNTER — Encounter: Payer: Self-pay | Admitting: Orthopedic Surgery

## 2023-11-01 DIAGNOSIS — M5416 Radiculopathy, lumbar region: Secondary | ICD-10-CM

## 2023-11-02 NOTE — Telephone Encounter (Signed)
 Moore put in order for injection

## 2023-11-03 ENCOUNTER — Encounter: Payer: Self-pay | Admitting: Nurse Practitioner

## 2023-11-13 ENCOUNTER — Ambulatory Visit: Admitting: Physical Medicine and Rehabilitation

## 2023-11-16 ENCOUNTER — Other Ambulatory Visit: Payer: Self-pay | Admitting: Medical Genetics

## 2023-11-16 DIAGNOSIS — Z006 Encounter for examination for normal comparison and control in clinical research program: Secondary | ICD-10-CM

## 2023-11-19 ENCOUNTER — Encounter: Payer: Self-pay | Admitting: Radiology

## 2023-11-21 ENCOUNTER — Ambulatory Visit: Admitting: Orthopedic Surgery

## 2023-11-26 ENCOUNTER — Other Ambulatory Visit: Payer: Self-pay

## 2023-11-26 ENCOUNTER — Ambulatory Visit: Admitting: Physical Medicine and Rehabilitation

## 2023-11-26 VITALS — BP 151/83 | HR 71

## 2023-11-26 DIAGNOSIS — M5416 Radiculopathy, lumbar region: Secondary | ICD-10-CM

## 2023-11-26 MED ORDER — METHYLPREDNISOLONE ACETATE 40 MG/ML IJ SUSP
40.0000 mg | Freq: Once | INTRAMUSCULAR | Status: AC
  Administered 2023-11-26: 40 mg

## 2023-11-26 NOTE — Procedures (Signed)
 Lumbosacral Transforaminal Epidural Steroid Injection - Sub-Pedicular Approach with Fluoroscopic Guidance  Patient: Charlotte Brooks      Date of Birth: 11-Jun-1945 MRN: [id number] PCP: Katheen Roselie Rockford, NP      Visit Date: 11/26/2023   Universal Protocol:    Date/Time: 11/26/2023  Consent Given By: the patient  Position: PRONE  Additional Comments: Vital signs were monitored before and after the procedure. Patient was prepped and draped in the usual sterile fashion. The correct patient, procedure, and site was verified.   Injection Procedure Details:   Procedure diagnoses: Lumbar radiculopathy [M54.16]    Meds Administered:  Meds ordered this encounter  Medications   methylPREDNISolone  acetate (DEPO-MEDROL ) injection 40 mg    Laterality: Right  Location/Site: L4  Needle:5.0 in., 22 ga.  Short bevel or Quincke spinal needle  Needle Placement: Transforaminal  Findings:    -Comments: Excellent flow of contrast along the nerve, nerve root and into the epidural space.  Procedure Details: After squaring off the end-plates to get a true AP view, the C-arm was positioned so that an oblique view of the foramen as noted above was visualized. The target area is just inferior to the nose of the scotty dog or sub pedicular. The soft tissues overlying this structure were infiltrated with 2-3 ml. of 1% Lidocaine  without Epinephrine .  The spinal needle was inserted toward the target using a trajectory view along the fluoroscope beam.  Under AP and lateral visualization, the needle was advanced so it did not puncture dura and was located close the 6 O'Clock position of the pedical in AP tracterory. Biplanar projections were used to confirm position. Aspiration was confirmed to be negative for CSF and/or blood. A 1-2 ml. volume of Isovue-250 was injected and flow of contrast was noted at each level. Radiographs were obtained for documentation purposes.   After attaining the  desired flow of contrast documented above, a 0.5 to 1.0 ml test dose of 0.25% Marcaine  was injected into each respective transforaminal space.  The patient was observed for 90 seconds post injection.  After no sensory deficits were reported, and normal lower extremity motor function was noted,   the above injectate was administered so that equal amounts of the injectate were placed at each foramen (level) into the transforaminal epidural space.   Additional Comments:  The patient tolerated the procedure well Dressing: 2 x 2 sterile gauze and Band-Aid    Post-procedure details: Patient was observed during the procedure. Post-procedure instructions were reviewed.  Patient left the clinic in stable condition.

## 2023-11-26 NOTE — Progress Notes (Signed)
 Pain Scale   Average Pain 4 Patient advising she has chronic lower back pain that radiates bilaterally to legs, patient advising pain increases when sitting and is constant        +Driver, -BT, -Dye Allergies.

## 2023-11-26 NOTE — Progress Notes (Signed)
 Charlotte Brooks - 78 y.o. female MRN [id number]  Date of birth: [birth year]  Office Visit Note: Visit Date: 11/26/2023 PCP: Katheen Roselie Rockford, NP Referred by: Georgina Ozell LABOR, MD  Subjective: Chief Complaint  Patient presents with   Lower Back - Pain   HPI:  Charlotte Brooks is a 78 y.o. female who comes in today at the request of Dr. Ozell Georgina for planned Right L4-5 Lumbar Transforaminal epidural steroid injection with fluoroscopic guidance.  The patient has failed conservative care including home exercise, medications, time and activity modification.  This injection will be diagnostic and hopefully therapeutic.  Please see requesting physician notes for further details and justification.    ROS Otherwise per HPI.  Assessment & Plan: Visit Diagnoses:    ICD-10-CM   1. Lumbar radiculopathy  M54.16 XR C-ARM NO REPORT    Epidural Steroid injection    methylPREDNISolone  acetate (DEPO-MEDROL ) injection 40 mg      Plan: No additional findings.   Meds & Orders:  Meds ordered this encounter  Medications   methylPREDNISolone  acetate (DEPO-MEDROL ) injection 40 mg    Orders Placed This Encounter  Procedures   XR C-ARM NO REPORT   Epidural Steroid injection    Follow-up: Return for visit to requesting provider as needed.   Procedures: No procedures performed  Lumbosacral Transforaminal Epidural Steroid Injection - Sub-Pedicular Approach with Fluoroscopic Guidance  Patient: Charlotte Brooks      Date of Birth: August 17, 1945 MRN: [id number] PCP: Katheen Roselie Rockford, NP      Visit Date: 11/26/2023   Universal Protocol:    Date/Time: 11/26/2023  Consent Given By: the patient  Position: PRONE  Additional Comments: Vital signs were monitored before and after the procedure. Patient was prepped and draped in the usual sterile fashion. The correct patient, procedure, and site was verified.   Injection Procedure Details:   Procedure diagnoses: Lumbar radiculopathy  [M54.16]    Meds Administered:  Meds ordered this encounter  Medications   methylPREDNISolone  acetate (DEPO-MEDROL ) injection 40 mg    Laterality: Right  Location/Site: L4  Needle:5.0 in., 22 ga.  Short bevel or Quincke spinal needle  Needle Placement: Transforaminal  Findings:    -Comments: Excellent flow of contrast along the nerve, nerve root and into the epidural space.  Procedure Details: After squaring off the end-plates to get a true AP view, the C-arm was positioned so that an oblique view of the foramen as noted above was visualized. The target area is just inferior to the nose of the scotty dog or sub pedicular. The soft tissues overlying this structure were infiltrated with 2-3 ml. of 1% Lidocaine  without Epinephrine .  The spinal needle was inserted toward the target using a trajectory view along the fluoroscope beam.  Under AP and lateral visualization, the needle was advanced so it did not puncture dura and was located close the 6 O'Clock position of the pedical in AP tracterory. Biplanar projections were used to confirm position. Aspiration was confirmed to be negative for CSF and/or blood. A 1-2 ml. volume of Isovue-250 was injected and flow of contrast was noted at each level. Radiographs were obtained for documentation purposes.   After attaining the desired flow of contrast documented above, a 0.5 to 1.0 ml test dose of 0.25% Marcaine  was injected into each respective transforaminal space.  The patient was observed for 90 seconds post injection.  After no sensory deficits were reported, and normal lower extremity motor function was noted,   the above  injectate was administered so that equal amounts of the injectate were placed at each foramen (level) into the transforaminal epidural space.   Additional Comments:  The patient tolerated the procedure well Dressing: 2 x 2 sterile gauze and Band-Aid    Post-procedure details: Patient was observed during the  procedure. Post-procedure instructions were reviewed.  Patient left the clinic in stable condition.    Clinical History: No specialty comments available.     Objective:  VS:  HT:    WT:   BMI:     BP:(!) 151/83  HR:71bpm  TEMP: ( )  RESP:  Physical Exam Vitals and nursing note reviewed.  Constitutional:      General: She is not in acute distress.    Appearance: Normal appearance. She is not ill-appearing.  HENT:     Head: Normocephalic and atraumatic.     Right Ear: External ear normal.     Left Ear: External ear normal.  Eyes:     Extraocular Movements: Extraocular movements intact.  Cardiovascular:     Rate and Rhythm: Normal rate.     Pulses: Normal pulses.  Pulmonary:     Effort: Pulmonary effort is normal. No respiratory distress.  Abdominal:     General: There is no distension.     Palpations: Abdomen is soft.  Musculoskeletal:        General: Tenderness present.     Cervical back: Neck supple.     Right lower leg: No edema.     Left lower leg: No edema.     Comments: Patient has good distal strength with no pain over the greater trochanters.  No clonus or focal weakness.  Skin:    Findings: No erythema, lesion or rash.  Neurological:     General: No focal deficit present.     Mental Status: She is alert and oriented to person, place, and time.     Sensory: No sensory deficit.     Motor: No weakness or abnormal muscle tone.     Coordination: Coordination normal.  Psychiatric:        Mood and Affect: Mood normal.        Behavior: Behavior normal.      Imaging: No results found.

## 2023-12-01 ENCOUNTER — Encounter: Payer: Self-pay | Admitting: Nurse Practitioner

## 2023-12-01 DIAGNOSIS — M810 Age-related osteoporosis without current pathological fracture: Secondary | ICD-10-CM

## 2023-12-03 ENCOUNTER — Other Ambulatory Visit: Payer: Self-pay

## 2023-12-03 MED ORDER — JUBBONTI 60 MG/ML ~~LOC~~ SOSY
60.0000 mg | PREFILLED_SYRINGE | SUBCUTANEOUS | 1 refills | Status: AC
  Filled 2023-12-03 – 2024-02-22 (×2): qty 1, 180d supply, fill #0

## 2023-12-06 NOTE — Telephone Encounter (Signed)
 Medication order placed on 12/03/23

## 2023-12-14 LAB — GENECONNECT MOLECULAR SCREEN: Genetic Analysis Overall Interpretation: NEGATIVE

## 2023-12-17 ENCOUNTER — Ambulatory Visit: Admitting: Orthopedic Surgery

## 2023-12-17 ENCOUNTER — Other Ambulatory Visit

## 2023-12-17 DIAGNOSIS — Z9889 Other specified postprocedural states: Secondary | ICD-10-CM

## 2023-12-17 NOTE — Progress Notes (Signed)
 Orthopedic Surgery Post-operative Office Visit   Procedure: L4/5 laminectomy  Date of Surgery: 06/12/2023 (~6 months post-op)   Assessment: Patient is a 78 y.o. who was doing well after surgery.  She is still better than where she was preoperatively but has noted onset of leg pain that will alternate between the right and left side     Plan: -No spine specific precautions, activity as tolerated -Pain management: Tylenol  as needed -Patient has tried conservative treatments for over 6 weeks now without any relief of her leg pain, so recommend MRI to evaluate further -Will consider injection or gabapentin/lyrica as a next step in treatment -Return to office in 4 weeks, x-rays needed at next visit: none  ___________________________________________________________________________     Subjective: Patient has noted onset of bilateral leg pain.  She feels that on a daily basis.  She notes that will alternate between the right side and the left side.  She says she feels it deep in the bones.  She cannot localize it to an aspect of the leg such as the lateral aspect.  She feels it mostly in the thigh though.  Will sometimes go into the leg.  As stated above, I will switch between being on the right side and on the left side depending on the day.  She notes it is worse if she is extending her back.  She said she will notice it particularly at night when laying down and is not as bad if she is in a recliner.  There was no trauma or injury that preceded the onset of this pain.   Objective:   General: no acute distress, appropriate affect Neurologic: alert, answering questions appropriately, following commands Respiratory: unlabored breathing on room air Skin: incision is well healed   MSK (spine):   -Strength exam                                                   Left                  Right   EHL                              5/5                  5/5 TA                                 5/5                   5/5 GSC                             5/5                  5/5 Knee extension            5/5                  5/5 Hip flexion                    5/5  5/5   -Sensory exam                           Sensation intact to light touch in L2-S1 nerve distributions of bilateral lower extremities   Imaging: XRs of the lumbar spine from 12/17/2023 were independently reviewed and interpreted, 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  Disc height loss is noted at L2/3, L3/4, and L4/5.  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  Laminectomy defect seen at L4/5.  No fracture or dislocation seen.     Patient name: Charlotte Brooks Patient MRN: [id number] Date of visit: 12/17/23  Pre-operative Scores   ODI: 44% VAS back: 2/10 VAS leg: 10/10  6 Month Post-operative Scores   ODI: 32% VAS back: 1/10 VAS leg: 6/10

## 2023-12-23 ENCOUNTER — Ambulatory Visit (HOSPITAL_BASED_OUTPATIENT_CLINIC_OR_DEPARTMENT_OTHER): Admission: RE | Admit: 2023-12-23 | Discharge: 2023-12-23 | Attending: Orthopedic Surgery

## 2023-12-23 DIAGNOSIS — Z9889 Other specified postprocedural states: Secondary | ICD-10-CM

## 2024-01-10 ENCOUNTER — Emergency Department (HOSPITAL_COMMUNITY)

## 2024-01-10 ENCOUNTER — Emergency Department (HOSPITAL_COMMUNITY)
Admission: EM | Admit: 2024-01-10 | Discharge: 2024-01-10 | Disposition: A | Discharge status: Home / Self Care | Attending: Emergency Medicine | Admitting: Emergency Medicine

## 2024-01-10 DIAGNOSIS — R1084 Generalized abdominal pain: Secondary | ICD-10-CM | POA: Insufficient documentation

## 2024-01-10 DIAGNOSIS — W19XXXA Unspecified fall, initial encounter: Secondary | ICD-10-CM | POA: Diagnosis not present

## 2024-01-10 DIAGNOSIS — R4182 Altered mental status, unspecified: Secondary | ICD-10-CM | POA: Insufficient documentation

## 2024-01-10 DIAGNOSIS — R531 Weakness: Secondary | ICD-10-CM | POA: Diagnosis not present

## 2024-01-10 DIAGNOSIS — R112 Nausea with vomiting, unspecified: Secondary | ICD-10-CM | POA: Insufficient documentation

## 2024-01-10 DIAGNOSIS — R55 Syncope and collapse: Secondary | ICD-10-CM | POA: Insufficient documentation

## 2024-01-10 LAB — URINALYSIS, ROUTINE W REFLEX MICROSCOPIC
Bacteria, UA: NONE SEEN
Bilirubin Urine: NEGATIVE
Glucose, UA: NEGATIVE mg/dL
Hgb urine dipstick: NEGATIVE
Ketones, ur: 5 mg/dL — AB
Leukocytes,Ua: NEGATIVE
Nitrite: NEGATIVE
Protein, ur: 30 mg/dL — AB
Specific Gravity, Urine: 1.011 (ref 1.005–1.030)
pH: 8 (ref 5.0–8.0)

## 2024-01-10 LAB — CBC WITH DIFFERENTIAL/PLATELET
Abs Immature Granulocytes: 0.1 K/uL — ABNORMAL HIGH (ref 0.00–0.07)
Basophils Absolute: 0 K/uL (ref 0.0–0.1)
Basophils Relative: 0 %
Eosinophils Absolute: 0.1 K/uL (ref 0.0–0.5)
Eosinophils Relative: 1 %
HCT: 36.4 % (ref 36.0–46.0)
Hemoglobin: 12.1 g/dL (ref 12.0–15.0)
Immature Granulocytes: 1 %
Lymphocytes Relative: 36 %
Lymphs Abs: 4.1 K/uL — ABNORMAL HIGH (ref 0.7–4.0)
MCH: 30.8 pg (ref 26.0–34.0)
MCHC: 33.2 g/dL (ref 30.0–36.0)
MCV: 92.6 fL (ref 80.0–100.0)
Monocytes Absolute: 0.3 K/uL (ref 0.1–1.0)
Monocytes Relative: 2 %
Neutro Abs: 6.9 K/uL (ref 1.7–7.7)
Neutrophils Relative %: 60 %
Platelets: 213 K/uL (ref 150–400)
RBC: 3.93 MIL/uL (ref 3.87–5.11)
RDW: 12.7 % (ref 11.5–15.5)
WBC: 11.5 K/uL — ABNORMAL HIGH (ref 4.0–10.5)
nRBC: 0 % (ref 0.0–0.2)

## 2024-01-10 LAB — RESP PANEL BY RT-PCR (RSV, FLU A&B, COVID)  RVPGX2
Influenza A by PCR: NEGATIVE
Influenza B by PCR: NEGATIVE
Resp Syncytial Virus by PCR: NEGATIVE
SARS Coronavirus 2 by RT PCR: NEGATIVE

## 2024-01-10 LAB — BASIC METABOLIC PANEL WITH GFR
Anion gap: 16 — ABNORMAL HIGH (ref 5–15)
BUN: 19 mg/dL (ref 8–23)
CO2: 22 mmol/L (ref 22–32)
Calcium: 8.9 mg/dL (ref 8.9–10.3)
Chloride: 104 mmol/L (ref 98–111)
Creatinine, Ser: 1.09 mg/dL — ABNORMAL HIGH (ref 0.44–1.00)
GFR, Estimated: 52 mL/min — ABNORMAL LOW
Glucose, Bld: 163 mg/dL — ABNORMAL HIGH (ref 70–99)
Potassium: 3.4 mmol/L — ABNORMAL LOW (ref 3.5–5.1)
Sodium: 141 mmol/L (ref 135–145)

## 2024-01-10 MED ORDER — PROMETHAZINE (PHENERGAN) 6.25MG IN NS 50ML IVPB
6.2500 mg | Freq: Four times a day (QID) | INTRAVENOUS | Status: DC | PRN
  Administered 2024-01-10: 6.25 mg via INTRAVENOUS
  Filled 2024-01-10: qty 6.25

## 2024-01-10 MED ORDER — LACTATED RINGERS IV BOLUS
1000.0000 mL | Freq: Once | INTRAVENOUS | Status: AC
  Administered 2024-01-10: 1000 mL via INTRAVENOUS

## 2024-01-10 MED ORDER — ONDANSETRON HCL 4 MG/2ML IJ SOLN
4.0000 mg | Freq: Once | INTRAMUSCULAR | Status: AC
  Administered 2024-01-10: 4 mg via INTRAVENOUS
  Filled 2024-01-10: qty 2

## 2024-01-10 MED ORDER — ONDANSETRON 4 MG PO TBDP: 4.0000 mg | ORAL_TABLET | Freq: Three times a day (TID) | ORAL | 0 refills | Status: AC | PRN

## 2024-01-10 NOTE — ED Triage Notes (Signed)
 Pt BIB [facility] from home for change in LOC and fall. Pt was at holiday party, got up from couch and fell in other room. Witnesses report pt was unresponsive. Per EMS pupils initially dilated at 6mm and was responsive to name only. HR 120 initially, dropped down to 40s briefly, then normalized. Initial BP per EMS 80s sys, most recent BP 90/50 after 400 mL NS. Pt did consume 1 delta 9 gummy at approx. 1600, no alcohol per husband. No obvious injuries noted. Pt complaining of nausea.

## 2024-01-10 NOTE — Discharge Instructions (Addendum)
 It was a pleasure meeting with you today.  As we discussed there were no acute or concerning findings on your imaging or lab work today.  Urinalysis did not show signs of infection.  Respiratory panel is negative for coronavirus, flu, and RSV.  Follow-up with your primary care provider in 3 to 4 days for ongoing evaluation.  Please return for further evaluation if you have additional fainting spells, dizziness, severe headache, persistent nausea and vomiting, generalized weakness, or any other new or concerning findings.

## 2024-01-10 NOTE — ED Provider Notes (Signed)
 " [place] EMERGENCY DEPARTMENT AT [facility] Provider Note   CSN: [id number] Arrival date & time: 01/10/24  1724     Patient presents with: Fall and Altered Mental Status   Charlotte Brooks is a 78 y.o. female.   Patient is here for evaluation after unwitnessed fall.  Patient is accompanied by her husband and several other family members who all chimed in to provide additional history as patient was vomiting or moaning during much of the exam.  Patient was with family however she was behind everyone when she fell.  Family heard her strike the ground and quickly came to her aid.  She fell a ground-level onto a concrete floor.  She is not on blood thinners.  Family states she was difficult to arouse.  She did take a THC gummy at 16:00 today.  Patient is only had a THC gummy once in the past and did not have a major reaction to it.  This is a new kind of THC gummy that she has never taken before.  Family deny any alcohol intake for the patient today.  She was behaving normally earlier today.  She had not eaten anything abnormal and no one else has been having similar symptoms.  She was brought in by EMS and currently wearing c-collar. Per EMS report, patient was initially hypovolemic.  Patient is complaining of pain all over and does not localize the pain to anywhere specific.  She had an episode of vomiting during exam.  She would follow commands during exam but did have to be redirected multiple times.  Pupils are equal round and reactive.  Hips are stable with no obvious deformity.  No obvious deformity or acute tenderness to the spine or paraspinal region.  Patient is able to move all 4 extremities purposefully.  No obvious head injury.  Patient reports that she ate breakfast today and has been drinking fluids.  The history is provided by the patient, the spouse and a relative (Multiple relatives).  Fall  Altered Mental Status Associated symptoms: vomiting        Prior to  Admission medications  Medication Sig Start Date End Date Taking? Authorizing Provider  ondansetron  (ZOFRAN -ODT) 4 MG disintegrating tablet Take 1 tablet (4 mg total) by mouth every 8 (eight) hours as needed for nausea or vomiting. 01/10/24  Yes Rosina Almarie LABOR, PA-C  Biotin 5000 MCG CAPS Take 10,000 mcg by mouth daily.    [provider]  Cholecalciferol (VITAMIN D  PO) Take 1,000 Units by mouth daily.    [provider]  denosumab -bbdz (JUBBONTI ) 60 MG/ML SOSY injection Inject 60 mg into the skin every 6 (six) months. 12/03/23   Nche, Roselie Rockford, NP  diclofenac  Sodium (VOLTAREN ) 1 % GEL Apply 2 g topically 3 (three) times daily as needed. Patient not taking: Reported on 05/25/2023 04/06/22   Nche, Roselie Rockford, NP  meloxicam  (MOBIC ) 7.5 MG tablet TAKE 1 TO 2 TABLETS BY  MOUTH ONCE DAILY WITH FOOD  AS NEEDED FOR PAIN Patient taking differently: Take 7.5 mg by mouth daily. 04/09/23   Nche, Roselie Rockford, NP  Misc Natural Products (TART CHERRY ADVANCED PO) Take 1 tablet by mouth daily.    [provider]  Omega-3 Fatty Acids (FISH OIL) 1000 MG CAPS Take 1,000 mg by mouth daily.    [provider]  vitamin C (ASCORBIC ACID) 500 MG tablet Take 1,000 mg by mouth daily.     [provider]  Wheat Dextrin (BENEFIBER  PO) Take 1 Scoop by mouth in the morning and at bedtime.    [provider]    Allergies: Penicillins, Codeine, and Sulfonamide derivatives    Review of Systems  Gastrointestinal:  Positive for vomiting.   Vitals:   01/10/24 2030 01/10/24 2100 01/10/24 2130 01/10/24 2200  BP: (!) 149/78 (!) 146/68 (!) 141/78 (!) 136/59  Pulse: (!) 108 (!) 101 (!) 102 (!) 101  Resp: 20 14 15 20   Temp:      TempSrc:      SpO2: 97% 97% 100% 98%  Weight:      Height:         Updated Vital Signs BP (!) 136/59   Pulse (!) 101   Temp (!) 97.3 F (36.3 C) (Oral)   Resp 20   Ht 5' 6 (1.676 m)   Wt 70.3 kg   SpO2 98%   BMI 25.02 kg/m    Physical Exam Vitals and nursing note reviewed.  Constitutional:      Appearance: Normal appearance. She is normal weight. She is ill-appearing (Vomiting and complaining of pain all over during exam.). She is not toxic-appearing or diaphoretic.     Comments: She would follow commands during exam but did have to be redirected multiple times.  Currently wearing c-collar placed by EMS.  HENT:     Head: Normocephalic and atraumatic.     Nose: Nose normal.     Mouth/Throat:     Mouth: Mucous membranes are moist.  Eyes:     General: No scleral icterus.       Right eye: No discharge.        Left eye: No discharge.     Extraocular Movements: Extraocular movements intact.     Conjunctiva/sclera: Conjunctivae normal.     Pupils: Pupils are equal, round, and reactive to light.  Cardiovascular:     Rate and Rhythm: Normal rate and regular rhythm.     Pulses: Normal pulses.     Heart sounds: Normal heart sounds.  Pulmonary:     Effort: Pulmonary effort is normal. No respiratory distress.     Breath sounds: Normal breath sounds. No stridor. No wheezing, rhonchi or rales.  Abdominal:     General: Abdomen is flat. Bowel sounds are normal. There is no distension.     Palpations: Abdomen is soft.     Tenderness: There is abdominal tenderness (Generalized abdominal tenderness with palpation.). There is no right CVA tenderness, left CVA tenderness or guarding.  Musculoskeletal:        General: No swelling, tenderness or deformity. Normal range of motion.     Cervical back: Normal range of motion. No swelling, deformity, signs of trauma, rigidity, tenderness or bony tenderness.     Thoracic back: No swelling, deformity, tenderness or bony tenderness.     Lumbar back: No swelling, deformity, tenderness or bony tenderness.     Right lower leg: No edema.     Left lower leg: No edema.  Skin:    General: Skin is warm and dry.     Capillary Refill: Capillary refill takes less than 2 seconds.      Coloration: Skin is not jaundiced or pale.     Findings: No bruising.  Neurological:     Mental Status: She is alert and oriented to person, place, and time.     Sensory: No sensory deficit.     (all labs ordered are listed, but only abnormal results are displayed) Labs Reviewed  BASIC METABOLIC  PANEL WITH GFR - Abnormal; Notable for the following components:      Result Value   Potassium 3.4 (*)    Glucose, Bld 163 (*)    Creatinine, Ser 1.09 (*)    GFR, Estimated 52 (*)    Anion gap 16 (*)    All other components within normal limits  CBC WITH DIFFERENTIAL/PLATELET - Abnormal; Notable for the following components:   WBC 11.5 (*)    Lymphs Abs 4.1 (*)    Abs Immature Granulocytes 0.10 (*)    All other components within normal limits  URINALYSIS, ROUTINE W REFLEX MICROSCOPIC - Abnormal; Notable for the following components:   Ketones, ur 5 (*)    Protein, ur 30 (*)    All other components within normal limits  RESP PANEL BY RT-PCR (RSV, FLU A&B, COVID)  [id number]    EKG: EKG Interpretation Date/Time:  Thursday January 10 2024 18:08:38 EST Ventricular Rate:  96 PR Interval:  190 QRS Duration:  207 QT Interval:  365 QTC Calculation: 462 R Axis:   64  Text Interpretation: Sinus rhythm IVCD, consider atypical RBBB Probable left ventricular hypertrophy Anterior Q waves, possibly due to LVH Artifact in lead(s) I II III aVR aVL aVF V1 V2 V3 V5 V6 Confirmed by Pamella Sharper [phone]) on 01/10/2024 7:22:21 PM  Radiology: CT Cervical Spine Wo Contrast Result Date: 01/10/2024 EXAM: CT CERVICAL SPINE WITHOUT CONTRAST 01/10/2024 08:00:00 PM TECHNIQUE: CT of the cervical spine was performed without the administration of intravenous contrast. Multiplanar reformatted images are provided for review. Automated exposure control, iterative reconstruction, and/or weight based adjustment of the mA/kV was utilized to reduce the radiation dose to as low as reasonably achievable. COMPARISON:  Comparison with 08/02/2015. CLINICAL HISTORY: Neck trauma (Age >= 65y). FINDINGS: BONES AND ALIGNMENT: No acute fracture or traumatic malalignment. DEGENERATIVE CHANGES: Multilevel spondylosis, disc space height loss, and degenerative endplate changes greatest at C5-C6 and C6-C7 where it is moderate. Mild-to-moderate multilevel facet arthropathy. No severe spinal canal narrowing. SOFT TISSUES: No prevertebral soft tissue swelling. IMPRESSION: 1. No evidence of acute traumatic injury. Electronically signed by: Norman Gatlin MD 01/10/2024 08:10 PM EST RP Workstation: [id number]   CT Head Wo Contrast Result Date: 01/10/2024 EXAM: CT HEAD WITHOUT CONTRAST 01/10/2024 08:00:00 PM TECHNIQUE: CT of the head was performed without the administration of intravenous contrast. Automated exposure control, iterative reconstruction, and/or weight based adjustment of the mA/kV was utilized to reduce the radiation dose to as low as reasonably achievable. COMPARISON: 7/ 17 / 17 CLINICAL HISTORY: Head trauma, moderate-severe FINDINGS: BRAIN AND VENTRICLES: No acute hemorrhage. No evidence of acute infarct. No hydrocephalus. No extra-axial collection. No mass effect or midline shift. ORBITS: No acute abnormality. SINUSES: No acute abnormality. SOFT TISSUES AND SKULL: No acute soft tissue abnormality. No skull fracture. IMPRESSION: 1. No acute intracranial abnormality. Electronically signed by: Norman Gatlin MD 01/10/2024 08:08 PM EST RP Workstation: [id number]     Procedures   Medications Ordered in the ED  promethazine  (PHENERGAN ) 6.25 mg/NS 50 mL IVPB (0 mg Intravenous Stopped 01/10/24 1938)  lactated ringers  bolus 1,000 mL (0 mLs Intravenous Stopped 01/10/24 1911)  ondansetron  (ZOFRAN ) injection 4 mg (4 mg Intravenous Given 01/10/24 1806)    Patient presents to the ED for concern of syncope and collapse, this involves an extensive number of treatment options, and is a complaint that carries with it a high risk of  complications and morbidity.  The differential diagnosis includes hypovolemia, drug-induced, arrhythmia, intracranial pressure, viral infection, sepsis, UTI,  ACS.   Additional history obtained:  Additional history obtained from EMS and Family   External records from outside source obtained and reviewed including review of EMS report and EMS EKG tracings (Sinus tach & Sinus rhythm, no evidence of STEMI on either) and additional history provided by multiple family members during history taking.   Lab Tests:  I Ordered, and personally interpreted labs.  The pertinent results include: Hemoglobin normal.  Lab work largely unremarkable.  UA not indicative of infection.  Respiratory panel negative.   Imaging Studies ordered:  I ordered imaging studies including CT head and cervical spine Imaging which showed no acute abnormalities   Cardiac Monitoring:  The patient was maintained on a cardiac monitor.  I personally viewed and interpreted the cardiac monitored which showed an underlying rhythm of: Sinus rhythm with no evidence of STEMI.   Medicines ordered and prescription drug management:  I ordered medication including fluids, Zofran , Phenergan  for nausea and vomiting Reevaluation of the patient after these medicines showed that the patient improved   Problem List / ED Course:     Syncope and collapse.  Workup largely unremarkable for any acute findings. Antiemetics given for nausea/vomiting. This syncopal episode is likely secondary to [facility] gummy ingestion or could be due to hypovolemia. Return precautions discussed with patient, husband, and daughter, and all verbalized understanding. Stable for discharge.   Reevaluation:  After the interventions noted above, I reevaluated the patient and found that they have :improved   Dispostion:  After consideration of the diagnostic results and the patients response to treatment, I feel that the patent would benefit from supportive care  in the home setting with close follow-up in primary care for ongoing evaluation.  Zofran  sent to pharmacy to be used as needed for nausea.  Return precautions given.   Medical Decision Making Amount and/or Complexity of Data Reviewed Labs: ordered. Radiology: ordered.  Risk Prescription drug management.   This note was produced using Electronics Engineer. While the provider has reviewed and verified all clinical information, transcription errors may remain.    Final diagnoses:  Syncope and collapse    ED Discharge Orders          Ordered    ondansetron  (ZOFRAN -ODT) 4 MG disintegrating tablet  Every 8 hours PRN        01/10/24 2245               Rosina Almarie LABOR, PA-C 01/11/24 0118    Pamella Ozell LABOR, DO 01/13/24 2014  "

## 2024-01-14 ENCOUNTER — Ambulatory Visit

## 2024-01-14 ENCOUNTER — Encounter: Payer: Self-pay | Admitting: Family Medicine

## 2024-01-14 ENCOUNTER — Ambulatory Visit: Admitting: Family Medicine

## 2024-01-14 VITALS — BP 173/83 | HR 82 | Temp 97.7°F | Ht 60.0 in | Wt 153.0 lb

## 2024-01-14 DIAGNOSIS — Z87898 Personal history of other specified conditions: Secondary | ICD-10-CM

## 2024-01-14 DIAGNOSIS — R0789 Other chest pain: Secondary | ICD-10-CM

## 2024-01-14 DIAGNOSIS — R0989 Other specified symptoms and signs involving the circulatory and respiratory systems: Secondary | ICD-10-CM

## 2024-01-14 DIAGNOSIS — M810 Age-related osteoporosis without current pathological fracture: Secondary | ICD-10-CM

## 2024-01-14 DIAGNOSIS — M25551 Pain in right hip: Secondary | ICD-10-CM

## 2024-01-14 DIAGNOSIS — F5104 Psychophysiologic insomnia: Secondary | ICD-10-CM | POA: Diagnosis not present

## 2024-01-14 MED ORDER — LANCET DEVICE MISC: 1.0000 | 0 refills | Status: AC

## 2024-01-14 MED ORDER — BLOOD GLUCOSE TEST VI STRP: 1.0000 | ORAL_STRIP | 0 refills | Status: AC

## 2024-01-14 MED ORDER — BLOOD GLUCOSE MONITORING SUPPL DEVI: 1.0000 | 0 refills | Status: AC

## 2024-01-14 MED ORDER — TRAZODONE HCL 50 MG PO TABS: 25.0000 mg | ORAL_TABLET | Freq: Every evening | ORAL | 0 refills | Status: DC | PRN

## 2024-01-14 MED ORDER — PREDNISONE 5 MG PO TABS: ORAL_TABLET | ORAL | 0 refills | Status: DC

## 2024-01-14 MED ORDER — LANCETS MISC: 1.0000 | 0 refills | Status: AC

## 2024-01-14 NOTE — Progress Notes (Signed)
 "    Diagnoses and Orders:   1. Rib pain on left side   2. Pain of right hip   3. History of syncope   4. Labile blood pressure   5. Chronic insomnia   6. Age-related osteoporosis without current pathological fracture    Meds ordered this encounter  Medications   traZODone  (DESYREL ) 50 MG tablet    Sig: Take 0.5-1 tablets (25-50 mg total) by mouth at bedtime as needed for sleep.    Dispense:  30 tablet    Refill:  0   Blood Glucose Monitoring Suppl DEVI    Sig: 1 each by Does not apply route as directed. Dispense based on patient and insurance preference. Use up to four times daily as directed. (FOR ICD-10 E10.9, E11.9).    Dispense:  1 each    Refill:  0   Glucose Blood (BLOOD GLUCOSE TEST STRIPS) STRP    Sig: 1 each by Does not apply route as directed. Dispense based on patient and insurance preference. Use up to four times daily as directed. (FOR ICD-10 E10.9, E11.9).    Dispense:  100 strip    Refill:  0   Lancet Device MISC    Sig: 1 each by Does not apply route as directed. Dispense based on patient and insurance preference. Use up to four times daily as directed. (FOR ICD-10 E10.9, E11.9).    Dispense:  1 each    Refill:  0   Lancets MISC    Sig: 1 each by Does not apply route as directed. Dispense based on patient and insurance preference. Use up to four times daily as directed. (FOR ICD-10 E10.9, E11.9).    Dispense:  100 each    Refill:  0   predniSONE  (DELTASONE ) 5 MG tablet    Sig: 4-3-2-1-off    Dispense:  10 tablet    Refill:  0   Orders Placed This Encounter  Procedures   DG Ribs Unilateral W/Chest Left   DG HIP UNILAT W OR W/O PELVIS 2-3 VIEWS RIGHT   Assessment & Plan:   Assessment and Plan Assessment & Plan Syncope and fall with rib and hip contusions Syncope likely due to THC gummy ingestion, causing fall. Rib and hip contusions suspected, significant pain, no fractures or lung puncture. CT head and neck normal. Pain may persist for months. -  Ordered x-ray of ribs and hip. - Prescribed low-dose prednisone  for inflammation. - Advised against THC gummy use.  Hypertension with labile blood pressure Labile blood pressure with episodes of hypertension, fluctuating between 140s and 170s. Heart rate stable. No dehydration or acute kidney injury. Potential thyroid  dysfunction. Elevated blood sugar at 163. - Monitor blood pressure at home. - Order labs to check thyroid  function. - Prescribed blood sugar monitoring device.  Chronic insomnia Difficulty with sleep. Previous sleep aids ineffective or caused adverse effects. Trazodone  considered for sedative properties. - Prescribed low-dose trazodone  for sleep. - Advised caution with medication use due to sensitivity.  Osteoporosis No new fractures post-fall.  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 Recent MRI performed, awaiting results. Pain managed with Tylenol  and meloxicam , though meloxicam  may elevate blood pressure. - Continue follow-up with orthopedic surgeon for MRI results. - Advised use of Tylenol  for pain management.  Geni Shutter, DO, MS, FAAFP, Dipl. [facility] [address] [place], [address] Dept: [phone] Dept Fax: [phone]  Subjective:   History of Present Illness Charlotte Brooks is  a 78 year old female who presents for follow-up after an emergency room visit for syncope. She is accompanied by her husband, Todd.  Syncope and associated symptoms - Syncopal episode occurred on January 10, 2024 after ingestion of a THC gummy, which she does not use regularly. - Lost consciousness from standing, collapsed onto a concrete floor. - Prior to syncope, experienced feeling hot and sick without dizziness, chest pain, or palpitations. - No dizziness before the event, but visual changes noted when looking at Christmas tree lights. - During the episode,  experienced marked blood pressure fluctuations, prolonged vomiting, sweating, stronger shaking, cyanotic lips, and subjective shortness of breath. - EMS found her hypovolemic at the scene. - Received IV fluids, Zofran , and Phenergan  in the emergency department with improvement. - Lab evaluation revealed leukocytosis with white count of 11.5.  Musculoskeletal injury and pain - Significant rib pain since the fall, worse on the left side. - Bruise present on the right hip. - History of osteoporosis, increasing risk for injury. - Concern for back injury due to prior L4-L5 laminectomy. - Recurrence of low back pain similar to pre-surgery, with radiation down her leg. - Recent lumbar MRI completed and orthopedic follow-up scheduled.  Gastrointestinal symptoms - Prolonged vomiting during syncopal episode. - Abdominal tenderness noted in the emergency department.  Blood pressure fluctuations and hot spells - Weeks of episodic hot spells prior to the event. - Blood pressure fluctuated from baseline 105-110 mmHg up to 173 mmHg.  Sleep disturbance - Chronic insomnia with worsening symptoms. - Difficulty falling asleep and maintaining sleep, often waking after a few hours and unable to return to sleep. - Over-the-counter remedies ineffective. - Adverse reactions to Ambien.  Review of Systems: Negative, with the exception of above mentioned in HPI.  History:   Reviewed by clinician on day of visit: allergies, medications, problem list, medical history, surgical history, family history, social history, and previous encounter notes.    Medications:   Show/hide medication list[1] Allergies[2]  Objective:   BP (!) 173/83 (BP Location: Left Arm, Patient Position: Sitting, Cuff Size: Normal)   Pulse 82   Temp 97.7 F (36.5 C) (Oral)   Ht 5' (1.524 m)   Wt 153 lb (69.4 kg)   SpO2 99%   BMI 29.88 kg/m    Physical Exam Constitutional:      General: She is not in acute distress.     Appearance: She is well-developed.  HENT:     Head: Normocephalic and atraumatic.  Eyes:     Conjunctiva/sclera: Conjunctivae normal.  Cardiovascular:     Rate and Rhythm: Normal rate and regular rhythm.     Heart sounds: Normal heart sounds.  Pulmonary:     Effort: Pulmonary effort is normal.     Breath sounds: Normal breath sounds.  Musculoskeletal:     Comments: Tenderness to palpation of left lower ribs, midaxillary, without palpated step-off. Tender to palpation right lateral hip, with noted bruising.   Neurological:     General: No focal deficit present.     Mental Status: She is alert.  Psychiatric:        Behavior: Behavior normal.       Results for orders placed or performed during the hospital encounter of 01/10/24  Basic metabolic panel   Collection Time: 01/10/24  6:00 PM  Result Value Ref Range   Sodium 141 135 - 145 mmol/L   Potassium 3.4 (L) 3.5 - 5.1 mmol/L   Chloride 104 98 - 111 mmol/L  CO2 22 22 - 32 mmol/L   Glucose, Bld 163 (H) 70 - 99 mg/dL   BUN 19 8 - 23 mg/dL   Creatinine, Ser 8.90 (H) 0.44 - 1.00 mg/dL   Calcium 8.9 8.9 - 89.6 mg/dL   GFR, Estimated 52 (L) >60 mL/min   Anion gap 16 (H) 5 - 15  CBC with Differential   Collection Time: 01/10/24  6:00 PM  Result Value Ref Range   WBC 11.5 (H) 4.0 - 10.5 K/uL   RBC 3.93 3.87 - 5.11 MIL/uL   Hemoglobin 12.1 12.0 - 15.0 g/dL   HCT 63.5 63.9 - 53.9 %   MCV 92.6 80.0 - 100.0 fL   MCH 30.8 26.0 - 34.0 pg   MCHC 33.2 30.0 - 36.0 g/dL   RDW 87.2 88.4 - 84.4 %   Platelets 213 150 - 400 K/uL   nRBC 0.0 0.0 - 0.2 %   Neutrophils Relative % 60 %   Neutro Abs 6.9 1.7 - 7.7 K/uL   Lymphocytes Relative 36 %   Lymphs Abs 4.1 (H) 0.7 - 4.0 K/uL   Monocytes Relative 2 %   Monocytes Absolute 0.3 0.1 - 1.0 K/uL   Eosinophils Relative 1 %   Eosinophils Absolute 0.1 0.0 - 0.5 K/uL   Basophils Relative 0 %   Basophils Absolute 0.0 0.0 - 0.1 K/uL   Immature Granulocytes 1 %   Abs Immature Granulocytes 0.10  (H) 0.00 - 0.07 K/uL  Resp panel by RT-PCR (RSV, Flu A&B, Covid) Anterior Nasal Swab   Collection Time: 01/10/24  8:17 PM   Specimen: Anterior Nasal Swab  Result Value Ref Range   SARS Coronavirus 2 by RT PCR NEGATIVE NEGATIVE   Influenza A by PCR NEGATIVE NEGATIVE   Influenza B by PCR NEGATIVE NEGATIVE   Resp Syncytial Virus by PCR NEGATIVE NEGATIVE  Urinalysis, Routine w reflex microscopic -Urine, Clean Catch   Collection Time: 01/10/24  8:17 PM  Result Value Ref Range   Color, Urine YELLOW YELLOW   APPearance CLEAR CLEAR   Specific Gravity, Urine 1.011 1.005 - 1.030   pH 8.0 5.0 - 8.0   Glucose, UA NEGATIVE NEGATIVE mg/dL   Hgb urine dipstick NEGATIVE NEGATIVE   Bilirubin Urine NEGATIVE NEGATIVE   Ketones, ur 5 (A) NEGATIVE mg/dL   Protein, ur 30 (A) NEGATIVE mg/dL   Nitrite NEGATIVE NEGATIVE   Leukocytes,Ua NEGATIVE NEGATIVE   RBC / HPF 0-5 0 - 5 RBC/hpf   WBC, UA 0-5 0 - 5 WBC/hpf   Bacteria, UA NONE SEEN NONE SEEN   Squamous Epithelial / HPF 0-5 0 - 5 /HPF   Hyaline Casts, UA PRESENT     Attestations:   Reviewed by clinician on day of visit: allergies, medications, problem list, medical history, surgical history, family history, social history, and previous encounter notes. Discussed the use of AI scribe software for clinical note transcription with the patient, who gave verbal consent to proceed.       [1]  Outpatient Medications Prior to Visit  Medication Sig   Biotin 5000 MCG CAPS Take 10,000 mcg by mouth daily.   Cholecalciferol (VITAMIN D  PO) Take 1,000 Units by mouth daily.   denosumab -bbdz (JUBBONTI ) 60 MG/ML SOSY injection Inject 60 mg into the skin every 6 (six) months.   diclofenac  Sodium (VOLTAREN ) 1 % GEL Apply 2 g topically 3 (three) times daily as needed.   meloxicam  (MOBIC ) 7.5 MG tablet TAKE 1 TO 2 TABLETS BY  MOUTH ONCE DAILY  WITH FOOD  AS NEEDED FOR PAIN (Patient taking differently: Take 7.5 mg by mouth daily.)   Misc Natural Products (TART CHERRY  ADVANCED PO) Take 1 tablet by mouth daily.   Omega-3 Fatty Acids (FISH OIL) 1000 MG CAPS Take 1,000 mg by mouth daily.   ondansetron  (ZOFRAN -ODT) 4 MG disintegrating tablet Take 1 tablet (4 mg total) by mouth every 8 (eight) hours as needed for nausea or vomiting.   vitamin C (ASCORBIC ACID) 500 MG tablet Take 1,000 mg by mouth daily.    Wheat Dextrin (BENEFIBER PO) Take 1 Scoop by mouth in the morning and at bedtime.   No facility-administered medications prior to visit.  [2]  Allergies Allergen Reactions   Penicillins Anaphylaxis and Swelling    Has patient had a PCN reaction causing immediate rash, facial/tongue/throat swelling, SOB or lightheadedness with hypotension: Unknown Has patient had a PCN reaction causing severe rash involving mucus membranes or skin necrosis: Unknown Has patient had a PCN reaction that required hospitalization Unknown Has patient had a PCN reaction occurring within the last 10 years: No  If all of the above answers are NO, then may proceed with Cephalosporin use.    Codeine Nausea And Vomiting   Sulfonamide Derivatives Rash   "

## 2024-01-16 ENCOUNTER — Ambulatory Visit: Admitting: Orthopedic Surgery

## 2024-01-16 DIAGNOSIS — M5416 Radiculopathy, lumbar region: Secondary | ICD-10-CM | POA: Diagnosis not present

## 2024-01-16 NOTE — Progress Notes (Signed)
 Orthopedic Surgery Post-operative Office Visit   Procedure: L4/5 laminectomy  Date of Surgery: 06/12/2023 (~7 months post-op)   Assessment: Patient is a 78 y.o. who is not noticing any back or leg pain at this point   Plan: -No spine specific precautions, activity as tolerated -Pain management: tylenol  as needed -Patient is asymptomatic at this point, so did not recommend any intervention -Talked again about injection or gabapentin/lyrica as possible additional treatments if leg pain returns -Return to office in 2 months, x-rays needed at next visit: none   ___________________________________________________________________________     Subjective: Patient had a fall on Christmas day. She passed out. She went to the ER. She was evaluated and no underlying cause of her syncope was found. She noted onset of left chest wall pain and left rib pain. The ER did not see any fracture nor did her PCP. She comes in today for follow up on her bilateral leg pain and to review her MRI. Since that fall, she has not had any radiating leg pain. She is not having any back pain.   Objective:   General: no acute distress, appropriate affect Neurologic: alert, answering questions appropriately, following commands Respiratory: unlabored breathing on room air Skin: incision is well healed   MSK (spine):   -Strength exam                                                   Left                  Right   EHL                              5/5                  5/5 TA                                 5/5                  5/5 GSC                             5/5                  5/5 Knee extension            5/5                  5/5 Hip flexion                    5/5                  5/5   -Sensory exam                           Sensation intact to light touch in L2-S1 nerve distributions of bilateral lower extremities   Imaging: XRs of the lumbar spine from 12/17/2023 were previously independently reviewed  and interpreted, 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  Disc height loss is noted at L2/3, L3/4, and L4/5.  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  Laminectomy defect seen at L4/5.  No fracture or dislocation seen.  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 Right sided foraminal stenosis at L5/S1. Anterolisthesis seen at L4/5 and L5/S1.      Patient name: Charlotte Brooks Patient MRN: [id number] Date of visit: 01/16/2024

## 2024-01-24 ENCOUNTER — Ambulatory Visit: Payer: Self-pay

## 2024-01-24 DIAGNOSIS — R0989 Other specified symptoms and signs involving the circulatory and respiratory systems: Secondary | ICD-10-CM

## 2024-01-24 NOTE — Telephone Encounter (Signed)
 Pt would like her thyroid  checked, states she was told to let her provider know when she was feeling well enough for blood work.   FYI Only or Action Required?: Action required by provider: pt wanting her thyroid  checked.  Patient was last seen in primary care on 01/14/2024 by Prentiss Frieze, DO.  Reason for Triage: Pt called in stating that she seen provider Prentiss on 12/29 and was told that provider is wanting to have pt check her thyroid . Per pt chart pt sent a message on 1/04 but there hasn't been a response just yet. Pt mentioned that her blood pressure has been high lately, when asked most recent results pt stated that it was 140/59. Pt also mentioned that she has been having headaches as well but nothing too severe. Pt blood sugar was recently 160 on christmas day and has went down to 103 but pt stated it is still concerning to her.    Reason for Disposition  [1] Follow-up call to recent contact AND [2] information only call, no triage required  Answer Assessment - Initial Assessment Questions 1. REASON FOR CALL: What is the main reason for your call? or How can I best help you?   Pt was told to let provider know when she was feeling well enough for blood work, pt also wants to know if she can have her thyroid  checked.  Protocols used: Information Only Call - No Triage-A-AH

## 2024-01-25 ENCOUNTER — Other Ambulatory Visit

## 2024-01-25 DIAGNOSIS — R0989 Other specified symptoms and signs involving the circulatory and respiratory systems: Secondary | ICD-10-CM

## 2024-01-25 LAB — T4, FREE: Free T4: 1.42 ng/dL (ref 0.60–1.60)

## 2024-01-25 LAB — TSH: TSH: 1.68 u[IU]/mL (ref 0.35–5.50)

## 2024-01-25 NOTE — Telephone Encounter (Signed)
 Per pts last OV note, Dr Prentiss wants to check pts thyroid  function. Future orders entered for TSH, Free T4. Message sent to pt to call and schedule a lab visit

## 2024-01-28 NOTE — Telephone Encounter (Signed)
 Completed.

## 2024-01-29 ENCOUNTER — Telehealth: Payer: Self-pay

## 2024-01-29 ENCOUNTER — Other Ambulatory Visit (HOSPITAL_COMMUNITY): Payer: Self-pay

## 2024-01-29 NOTE — Telephone Encounter (Signed)
 Prolia  VOB initiated via [email or web address]  Next Prolia  inj DUE: 02/29/24   JUBBONTI  PREFERRED FOR PHARMACY COPAY: $150

## 2024-01-30 NOTE — Telephone Encounter (Signed)
 SABRA

## 2024-01-30 NOTE — Telephone Encounter (Signed)
 Pt ready for scheduling for PROLIA  on or after : 02/29/24  Option# 1: Buy/Bill (Office supplied medication)  Out-of-pocket cost due at time of clinic visit: $200  Number of injection/visits approved: 2  Primary: [employer]-MED[employer] Prolia  co-insurance: 0% Admin fee co-insurance: 0%  Secondary: --- Prolia  co-insurance:  Admin fee co-insurance:   Medical Benefit Details: Date Benefits were checked: 01/29/24 Deductible: $0 Met of $200 Required/ Coinsurance: 0%/ Admin Fee: 0%  Prior Auth: APPROVED PA# [id number] Expiration Date: 02/29/24-02/28/25  # of doses approved: 2 ----------------------------------------------------------------------- Option# 2- Med Obtained from pharmacy: JUBBONTI  PREFERRED FOR PHARMACY   Pharmacy benefit: Copay $150 (Paid to pharmacy) Admin Fee: 0% (Pay at clinic)  Prior Auth: N/A PA# Expiration Date:   # of doses approved:   If patient wants fill through the pharmacy benefit please send prescription to: [facility], and include estimated need by date in rx notes. Pharmacy will ship medication directly to the office.  Patient NOT eligible for Prolia  Copay Card. Copay Card can make patient's cost as little as $25. Link to apply: [email or web address]://[email or web address]  ** This summary of benefits is an estimation of the patient's out-of-pocket cost. Exact cost may very based on individual plan coverage.

## 2024-01-30 NOTE — Telephone Encounter (Signed)
 Charlotte Brooks

## 2024-01-31 ENCOUNTER — Ambulatory Visit: Admitting: Nurse Practitioner

## 2024-01-31 ENCOUNTER — Encounter: Payer: Self-pay | Admitting: Nurse Practitioner

## 2024-01-31 ENCOUNTER — Ambulatory Visit: Attending: Nurse Practitioner

## 2024-01-31 VITALS — BP 144/72 | HR 85 | Temp 97.3°F | Ht 65.0 in | Wt 151.8 lb

## 2024-01-31 DIAGNOSIS — M791 Myalgia, unspecified site: Secondary | ICD-10-CM

## 2024-01-31 DIAGNOSIS — F5104 Psychophysiologic insomnia: Secondary | ICD-10-CM | POA: Diagnosis not present

## 2024-01-31 DIAGNOSIS — R5383 Other fatigue: Secondary | ICD-10-CM

## 2024-01-31 DIAGNOSIS — R002 Palpitations: Secondary | ICD-10-CM

## 2024-01-31 DIAGNOSIS — R0989 Other specified symptoms and signs involving the circulatory and respiratory systems: Secondary | ICD-10-CM | POA: Diagnosis not present

## 2024-01-31 DIAGNOSIS — R7301 Impaired fasting glucose: Secondary | ICD-10-CM | POA: Diagnosis not present

## 2024-01-31 DIAGNOSIS — I10 Essential (primary) hypertension: Secondary | ICD-10-CM | POA: Insufficient documentation

## 2024-01-31 DIAGNOSIS — Z87898 Personal history of other specified conditions: Secondary | ICD-10-CM

## 2024-01-31 LAB — POCT GLYCOSYLATED HEMOGLOBIN (HGB A1C): Hemoglobin A1C: 5.9 % — AB (ref 4.0–5.6)

## 2024-01-31 LAB — CORTISOL: Cortisol, Plasma: 10.3 ug/dL

## 2024-01-31 MED ORDER — LOSARTAN POTASSIUM 25 MG PO TABS: 25.0000 mg | ORAL_TABLET | Freq: Every day | ORAL | 5 refills | Status: AC

## 2024-01-31 NOTE — Progress Notes (Signed)
 "  Acute Office Visit  Subjective:    Patient ID: Charlotte Brooks, female    DOB: 1945/08/07, 79 y.o.   MRN: [id number]  Chief Complaint  Patient presents with   Follow-up    Follow up for recent fall still experiencing fatigue, still having pain in hips, ribs and buttocks  Stopped trazodone  headaches worsen, no sleep    HPI Accompanied by her husband  Prior to syncopal episodes, she reports episodes of hot flashes daily, onset 1.53months, each episode last a few minutes minutes, associated with fluttering sensation in chest, SOB with exertion, and progressive fatigue. No dizziness. no headache, no CP. Post syncopal episode so reports generalized fatigue, malaise and myalgia. Elevated BP 1st noted 08/2023. She denies any new meds or supplements prior to syncopal episode. She has hx of chronic insomnia, No improvement with melatonin and trazodone . Able to sleep 5-6hrs with tylenol  PM  BP Readings from Last 3 Encounters:  01/31/24 (!) 144/72  01/14/24 (!) 173/83  01/10/24 (!) 136/59    Show/hide medication list[1]  Reviewed past medical and social history.  Review of Systems Per HPI     Objective:    Physical Exam Vitals and nursing note reviewed.  Cardiovascular:     Rate and Rhythm: Normal rate and regular rhythm.     Pulses: Normal pulses.     Heart sounds: Normal heart sounds.  Pulmonary:     Effort: Pulmonary effort is normal.     Breath sounds: Normal breath sounds.  Chest:     Chest wall: Tenderness present.  Musculoskeletal:     Left hip: Tenderness present. No crepitus. Normal range of motion. Normal strength.     Right lower leg: No edema.     Left lower leg: No edema.  Neurological:     Mental Status: She is alert and oriented to person, place, and time.  Psychiatric:        Mood and Affect: Mood normal.        Behavior: Behavior normal.        Thought Content: Thought content normal.     BP (!) 144/72 (BP Location: Left Arm, Patient Position: Supine,  Cuff Size: Normal)   Pulse 85   Temp (!) 97.3 F (36.3 C) (Oral)   Ht 5' 5 (1.651 m)   Wt 151 lb 12.8 oz (68.9 kg)   SpO2 99%   BMI 25.26 kg/m    Results for orders placed or performed in visit on 01/31/24  POCT glycosylated hemoglobin (Hb A1C)  Result Value Ref Range   Hemoglobin A1C 5.9 (A) 4.0 - 5.6 %   HbA1c POC (<> result, manual entry)     HbA1c, POC (prediabetic range)     HbA1c, POC (controlled diabetic range)        Assessment & Plan:   Problem List Items Addressed This Visit     Chronic insomnia   Relevant Orders   Cortisol   Ambulatory referral to Sleep Studies   Labile blood pressure   Relevant Medications   losartan  (COZAAR ) 25 MG tablet   Other Relevant Orders   Cortisol   Ambulatory referral to Sleep Studies   LONG TERM MONITOR XT (3-14 DAYS)   Other Visit Diagnoses       Myalgia    -  Primary   Relevant Orders   VITAMIN D  25 Hydroxy (Vit-D Deficiency, Fractures)     Impaired fasting glucose       Relevant Orders   POCT glycosylated  hemoglobin (Hb A1C) (Completed)     Other fatigue       Relevant Orders   Sedimentation rate   C-reactive protein   CK   Hepatic function panel   Ambulatory referral to Sleep Studies   VITAMIN D  25 Hydroxy (Vit-D Deficiency, Fractures)     Intermittent palpitations       Relevant Orders   LONG TERM MONITOR XT (3-14 DAYS)     History of syncope       Relevant Orders   LONG TERM MONITOR XT (3-14 DAYS)      Meds ordered this encounter  Medications   losartan  (COZAAR ) 25 MG tablet    Sig: Take 1 tablet (25 mg total) by mouth daily.    Dispense:  30 tablet    Refill:  5    Supervising Provider:   BERNETA ELSIE SAYRE [[id number]]   Return in about 2 weeks (around 02/14/2024) for HTN and insomnia.  Roselie Mood, NP     [1]  Outpatient Medications Prior to Visit  Medication Sig   ACCU-CHEK GUIDE TEST test strip SMARTSIG:1-4 Times Daily   Biotin 5000 MCG CAPS Take 10,000 mcg by mouth daily.   Blood  Glucose Monitoring Suppl (ACCU-CHEK GUIDE) w/Device KIT USE TO MONITOR BLOOD GLUCOSE UP TO FOUR TIMES DAILY AS DIRECTED   Blood Glucose Monitoring Suppl DEVI 1 each by Does not apply route as directed. Dispense based on patient and insurance preference. Use up to four times daily as directed. (FOR ICD-10 E10.9, E11.9).   Cholecalciferol (VITAMIN D  PO) Take 1,000 Units by mouth daily.   denosumab -bbdz (JUBBONTI ) 60 MG/ML SOSY injection Inject 60 mg into the skin every 6 (six) months.   diclofenac  Sodium (VOLTAREN ) 1 % GEL Apply 2 g topically 3 (three) times daily as needed.   Glucose Blood (BLOOD GLUCOSE TEST STRIPS) STRP 1 each by Does not apply route as directed. Dispense based on patient and insurance preference. Use up to four times daily as directed. (FOR ICD-10 E10.9, E11.9).   Lancet Device MISC 1 each by Does not apply route as directed. Dispense based on patient and insurance preference. Use up to four times daily as directed. (FOR ICD-10 E10.9, E11.9).   Lancets Misc. (ACCU-CHEK SOFTCLIX LANCET DEV) KIT USE TO MONITOR BLOOD GLUCOSE UP TO FOUR TIMES DAILY AS DIRECTED   Lancets MISC 1 each by Does not apply route as directed. Dispense based on patient and insurance preference. Use up to four times daily as directed. (FOR ICD-10 E10.9, E11.9).   Misc Natural Products (TART CHERRY ADVANCED PO) Take 1 tablet by mouth daily.   Omega-3 Fatty Acids (FISH OIL) 1000 MG CAPS Take 1,000 mg by mouth daily.   ondansetron  (ZOFRAN -ODT) 4 MG disintegrating tablet Take 1 tablet (4 mg total) by mouth every 8 (eight) hours as needed for nausea or vomiting.   vitamin C (ASCORBIC ACID) 500 MG tablet Take 1,000 mg by mouth daily.    Wheat Dextrin (BENEFIBER PO) Take 1 Scoop by mouth in the morning and at bedtime.   [DISCONTINUED] predniSONE  (DELTASONE ) 5 MG tablet 4-3-2-1-off   [DISCONTINUED] meloxicam  (MOBIC ) 7.5 MG tablet TAKE 1 TO 2 TABLETS BY  MOUTH ONCE DAILY WITH FOOD  AS NEEDED FOR PAIN (Patient not taking:  Reported on 01/31/2024)   [DISCONTINUED] traZODone  (DESYREL ) 50 MG tablet Take 0.5-1 tablets (25-50 mg total) by mouth at bedtime as needed for sleep. (Patient not taking: Reported on 01/31/2024)   No facility-administered medications prior to visit.   "

## 2024-01-31 NOTE — Progress Notes (Unsigned)
 EP to read.

## 2024-01-31 NOTE — Patient Instructions (Addendum)
 Go to lab Start losartan  25mg  in Am for elevated BP Ok to use tylenol  650mg  every 8hrs as needed for pain. Ok to use tylenol  PM at bedtime. Call office for tramadol  dose if you change your mind.

## 2024-02-01 ENCOUNTER — Ambulatory Visit: Payer: Self-pay | Admitting: Family Medicine

## 2024-02-01 ENCOUNTER — Ambulatory Visit: Payer: Self-pay | Admitting: Nurse Practitioner

## 2024-02-01 LAB — HEPATIC FUNCTION PANEL
ALT: 10 IU/L (ref 0–32)
AST: 21 IU/L (ref 0–40)
Albumin: 4.3 g/dL (ref 3.8–4.8)
Alkaline Phosphatase: 112 IU/L (ref 49–135)
Bilirubin Total: 0.4 mg/dL (ref 0.0–1.2)
Bilirubin, Direct: 0.13 mg/dL (ref 0.00–0.40)
Total Protein: 7.2 g/dL (ref 6.0–8.5)

## 2024-02-01 LAB — CK: Total CK: 29 U/L — ABNORMAL LOW (ref 32–182)

## 2024-02-01 LAB — C-REACTIVE PROTEIN: CRP: 5 mg/L (ref 0–10)

## 2024-02-01 LAB — SEDIMENTATION RATE: Sed Rate: 65 mm/h — ABNORMAL HIGH (ref 0–40)

## 2024-02-02 ENCOUNTER — Encounter: Payer: Self-pay | Admitting: Nurse Practitioner

## 2024-02-02 LAB — SPECIMEN STATUS REPORT

## 2024-02-02 LAB — VITAMIN D 25 HYDROXY (VIT D DEFICIENCY, FRACTURES): Vit D, 25-Hydroxy: 37 ng/mL (ref 30.0–100.0)

## 2024-02-05 ENCOUNTER — Encounter: Payer: Self-pay | Admitting: Nurse Practitioner

## 2024-02-05 DIAGNOSIS — R0989 Other specified symptoms and signs involving the circulatory and respiratory systems: Secondary | ICD-10-CM

## 2024-02-05 DIAGNOSIS — R5383 Other fatigue: Secondary | ICD-10-CM

## 2024-02-05 DIAGNOSIS — F5104 Psychophysiologic insomnia: Secondary | ICD-10-CM

## 2024-02-06 ENCOUNTER — Other Ambulatory Visit (HOSPITAL_COMMUNITY): Payer: Self-pay

## 2024-02-13 ENCOUNTER — Other Ambulatory Visit: Payer: Self-pay

## 2024-02-15 ENCOUNTER — Encounter: Payer: Self-pay | Admitting: Nurse Practitioner

## 2024-02-15 ENCOUNTER — Ambulatory Visit (INDEPENDENT_AMBULATORY_CARE_PROVIDER_SITE_OTHER): Admitting: Nurse Practitioner

## 2024-02-15 VITALS — BP 138/62 | HR 78 | Ht 65.5 in | Wt 152.0 lb

## 2024-02-15 DIAGNOSIS — I10 Essential (primary) hypertension: Secondary | ICD-10-CM | POA: Diagnosis not present

## 2024-02-15 DIAGNOSIS — F5104 Psychophysiologic insomnia: Secondary | ICD-10-CM

## 2024-02-15 NOTE — Progress Notes (Signed)
 "               Established Patient Visit  Patient: Charlotte Brooks   DOB: 1945/06/09   79 y.o. Female  MRN: [id number] Visit Date: 02/15/2024  Subjective:    Chief Complaint  Patient presents with   Follow-up    2 week follow up for HTN and insomnia    HPI Chronic insomnia Referred to sleep specialist. Had appointment [facility] provider on 02/14/2024. Remeron prescribed. Sleep study not recommended at this time.  HTN (hypertension) New diagnosis, hx of labile BP associated with palpitation Improved BP controlled with losartan  Average home BP reading 130s/80s Denies any adverse effects with losartan  25mg  Pending holter monitor report No tobacco use, no DIABETES See noted under insomnia Normal Tsh, cortisol, CMP, and CBC BP Readings from Last 3 Encounters:  02/15/24 138/62  01/31/24 (!) 144/72  01/14/24 (!) 173/83    Maintain med dose and DASH diet F/up in 3months  Reviewed medical, surgical, and social history today  Medications: Show/hide medication list[1] Reviewed past medical and social history.   ROS per HPI above      Objective:  BP 138/62 (BP Location: Left Arm, Patient Position: Sitting, Cuff Size: Normal)   Pulse 78   Ht 5' 5.5 (1.664 m)   Wt 152 lb (68.9 kg)   SpO2 96%   BMI 24.91 kg/m      Physical Exam Vitals and nursing note reviewed.  Cardiovascular:     Rate and Rhythm: Normal rate and regular rhythm.     Pulses: Normal pulses.     Heart sounds: Normal heart sounds.  Pulmonary:     Effort: Pulmonary effort is normal.  Musculoskeletal:     Right lower leg: No edema.     Left lower leg: No edema.  Neurological:     Mental Status: She is alert and oriented to person, place, and time.  Psychiatric:        Mood and Affect: Mood normal.        Behavior: Behavior normal.        Thought Content: Thought content normal.     No results found for any visits on 02/15/24.    Assessment & Plan:    Problem List Items Addressed This  Visit     Chronic insomnia   Referred to sleep specialist. Had appointment [facility] provider on 02/14/2024. Remeron prescribed. Sleep study not recommended at this time.      HTN (hypertension) - Primary   New diagnosis, hx of labile BP associated with palpitation Improved BP controlled with losartan  Average home BP reading 130s/80s Denies any adverse effects with losartan  25mg  Pending holter monitor report No tobacco use, no DIABETES See noted under insomnia Normal Tsh, cortisol, CMP, and CBC BP Readings from Last 3 Encounters:  02/15/24 138/62  01/31/24 (!) 144/72  01/14/24 (!) 173/83    Maintain med dose and DASH diet F/up in 3months      Return in about 3 months (around 05/15/2024) for HTN, prediabetes.     Roselie Mood, NP      [1]  Outpatient Medications Prior to Visit  Medication Sig   ACCU-CHEK GUIDE TEST test strip SMARTSIG:1-4 Times Daily   Biotin 5000 MCG CAPS Take 10,000 mcg by mouth daily.   Blood Glucose Monitoring Suppl (ACCU-CHEK GUIDE) w/Device KIT USE TO MONITOR BLOOD GLUCOSE UP TO FOUR TIMES DAILY AS DIRECTED   Blood Glucose Monitoring Suppl DEVI 1 each by Does not apply route  as directed. Dispense based on patient and insurance preference. Use up to four times daily as directed. (FOR ICD-10 E10.9, E11.9).   Cholecalciferol (VITAMIN D  PO) Take 1,000 Units by mouth daily.   denosumab -bbdz (JUBBONTI ) 60 MG/ML SOSY injection Inject 60 mg into the skin every 6 (six) months.   diclofenac  Sodium (VOLTAREN ) 1 % GEL Apply 2 g topically 3 (three) times daily as needed.   Glucose Blood (BLOOD GLUCOSE TEST STRIPS) STRP 1 each by Does not apply route as directed. Dispense based on patient and insurance preference. Use up to four times daily as directed. (FOR ICD-10 E10.9, E11.9).   Lancet Device MISC 1 each by Does not apply route as directed. Dispense based on patient and insurance preference. Use up to four times daily as directed. (FOR ICD-10 E10.9,  E11.9).   Lancets Misc. (ACCU-CHEK SOFTCLIX LANCET DEV) KIT USE TO MONITOR BLOOD GLUCOSE UP TO FOUR TIMES DAILY AS DIRECTED   Lancets MISC 1 each by Does not apply route as directed. Dispense based on patient and insurance preference. Use up to four times daily as directed. (FOR ICD-10 E10.9, E11.9).   losartan  (COZAAR ) 25 MG tablet Take 1 tablet (25 mg total) by mouth daily.   mirtazapine (REMERON) 15 MG tablet Take 1/2 tablet PO qHS for 7-10 days, if symptoms continue increase to 1 tablet PO qHS   Misc Natural Products (TART CHERRY ADVANCED PO) Take 1 tablet by mouth daily.   Omega-3 Fatty Acids (FISH OIL) 1000 MG CAPS Take 1,000 mg by mouth daily.   ondansetron  (ZOFRAN -ODT) 4 MG disintegrating tablet Take 1 tablet (4 mg total) by mouth every 8 (eight) hours as needed for nausea or vomiting.   vitamin C (ASCORBIC ACID) 500 MG tablet Take 1,000 mg by mouth daily.    Wheat Dextrin (BENEFIBER PO) Take 1 Scoop by mouth in the morning and at bedtime.   diphenhydramine-acetaminophen  (TYLENOL  PM EXTRA STRENGTH) 25-500 MG TABS tablet Take 1 tablet by mouth at bedtime as needed.   No facility-administered medications prior to visit.   "

## 2024-02-15 NOTE — Patient Instructions (Signed)
 Maintain current med dose Ok to stop glucose check Continue BP check 3x/week in AM.

## 2024-02-15 NOTE — Assessment & Plan Note (Signed)
 Referred to sleep specialist. Had appointment [facility] provider on 02/14/2024. Remeron prescribed. Sleep study not recommended at this time.

## 2024-02-15 NOTE — Assessment & Plan Note (Addendum)
 New diagnosis, hx of labile BP associated with palpitation Improved BP controlled with losartan  Average home BP reading 130s/80s Denies any adverse effects with losartan  25mg  Pending holter monitor report No tobacco use, no DIABETES See noted under insomnia Normal Tsh, cortisol, CMP, and CBC BP Readings from Last 3 Encounters:  02/15/24 138/62  01/31/24 (!) 144/72  01/14/24 (!) 173/83    Maintain med dose and DASH diet F/up in 3months

## 2024-02-22 ENCOUNTER — Other Ambulatory Visit: Payer: Self-pay

## 2024-02-22 NOTE — Progress Notes (Signed)
 Specialty Pharmacy Refill Coordination Note  Charlotte Brooks is a 79 y.o. female contacted today regarding refills of specialty medication(s) Denosumab -bbdz (Jubbonti )   Patient requested Courier to Provider Office   Delivery date: 02/27/24   Verified address: [facility] [address]   Medication will be filled on: 02/26/24

## 2024-03-04 ENCOUNTER — Ambulatory Visit

## 2024-03-13 ENCOUNTER — Ambulatory Visit: Admitting: Orthopedic Surgery

## 2024-04-09 ENCOUNTER — Encounter: Admitting: Nurse Practitioner
